# Patient Record
Sex: Female | Born: 1950 | Race: Black or African American | Hispanic: No | Smoking: Never smoker
Health system: Southern US, Community
[De-identification: ages and names within clinical notes are randomized; demographics above are authoritative.]

## PROBLEM LIST (undated history)

## (undated) DIAGNOSIS — I10 Essential (primary) hypertension: Secondary | ICD-10-CM

## (undated) DIAGNOSIS — E119 Type 2 diabetes mellitus without complications: Secondary | ICD-10-CM

## (undated) DIAGNOSIS — I639 Cerebral infarction, unspecified: Secondary | ICD-10-CM

## (undated) HISTORY — PX: ABDOMINAL HYSTERECTOMY: SHX81

---

## 2018-08-13 ENCOUNTER — Other Ambulatory Visit: Payer: Self-pay | Admitting: Family Medicine

## 2018-08-13 DIAGNOSIS — Z1231 Encounter for screening mammogram for malignant neoplasm of breast: Secondary | ICD-10-CM

## 2018-08-13 DIAGNOSIS — Z13820 Encounter for screening for osteoporosis: Secondary | ICD-10-CM

## 2018-09-12 ENCOUNTER — Ambulatory Visit
Admission: RE | Admit: 2018-09-12 | Discharge: 2018-09-12 | Disposition: A | Discharge status: Home / Self Care | Payer: Medicare HMO | Source: Ambulatory Visit | Attending: Family Medicine | Admitting: Family Medicine

## 2018-09-12 ENCOUNTER — Encounter: Payer: Self-pay | Admitting: Radiology

## 2018-09-12 DIAGNOSIS — I69398 Other sequelae of cerebral infarction: Secondary | ICD-10-CM | POA: Diagnosis not present

## 2018-09-12 DIAGNOSIS — M810 Age-related osteoporosis without current pathological fracture: Secondary | ICD-10-CM | POA: Insufficient documentation

## 2018-09-12 DIAGNOSIS — Z780 Asymptomatic menopausal state: Secondary | ICD-10-CM | POA: Diagnosis not present

## 2018-09-12 DIAGNOSIS — Z13820 Encounter for screening for osteoporosis: Secondary | ICD-10-CM | POA: Insufficient documentation

## 2018-09-12 DIAGNOSIS — Z1231 Encounter for screening mammogram for malignant neoplasm of breast: Secondary | ICD-10-CM | POA: Diagnosis present

## 2019-10-05 ENCOUNTER — Observation Stay
Admission: EM | Admit: 2019-10-05 | Discharge: 2019-10-07 | Disposition: A | Discharge status: Home / Self Care | Payer: Medicare (Managed Care) | Attending: Internal Medicine | Admitting: Internal Medicine

## 2019-10-05 ENCOUNTER — Other Ambulatory Visit: Payer: Self-pay

## 2019-10-05 DIAGNOSIS — N179 Acute kidney failure, unspecified: Secondary | ICD-10-CM | POA: Diagnosis present

## 2019-10-05 DIAGNOSIS — E785 Hyperlipidemia, unspecified: Secondary | ICD-10-CM | POA: Insufficient documentation

## 2019-10-05 DIAGNOSIS — E119 Type 2 diabetes mellitus without complications: Secondary | ICD-10-CM

## 2019-10-05 DIAGNOSIS — Z20822 Contact with and (suspected) exposure to covid-19: Secondary | ICD-10-CM | POA: Diagnosis not present

## 2019-10-05 DIAGNOSIS — K922 Gastrointestinal hemorrhage, unspecified: Principal | ICD-10-CM | POA: Diagnosis present

## 2019-10-05 DIAGNOSIS — N390 Urinary tract infection, site not specified: Secondary | ICD-10-CM | POA: Diagnosis present

## 2019-10-05 DIAGNOSIS — I10 Essential (primary) hypertension: Secondary | ICD-10-CM | POA: Diagnosis present

## 2019-10-05 DIAGNOSIS — I69354 Hemiplegia and hemiparesis following cerebral infarction affecting left non-dominant side: Secondary | ICD-10-CM | POA: Insufficient documentation

## 2019-10-05 DIAGNOSIS — Z794 Long term (current) use of insulin: Secondary | ICD-10-CM | POA: Diagnosis not present

## 2019-10-05 DIAGNOSIS — I639 Cerebral infarction, unspecified: Secondary | ICD-10-CM | POA: Diagnosis present

## 2019-10-05 DIAGNOSIS — R531 Weakness: Secondary | ICD-10-CM | POA: Insufficient documentation

## 2019-10-05 DIAGNOSIS — Z7902 Long term (current) use of antithrombotics/antiplatelets: Secondary | ICD-10-CM | POA: Diagnosis not present

## 2019-10-05 DIAGNOSIS — N3000 Acute cystitis without hematuria: Secondary | ICD-10-CM

## 2019-10-05 HISTORY — DX: Type 2 diabetes mellitus without complications: E11.9

## 2019-10-05 HISTORY — DX: Essential (primary) hypertension: I10

## 2019-10-05 HISTORY — DX: Cerebral infarction, unspecified: I63.9

## 2019-10-05 LAB — CBC
HCT: 39.3 % (ref 36.0–46.0)
HCT: 39.2 % (ref 36.0–46.0)
HCT: 39.4 % (ref 36.0–46.0)
HCT: 36.7 % (ref 36.0–46.0)
Hemoglobin: 12.4 g/dL (ref 12.0–15.0)
Hemoglobin: 13 g/dL (ref 12.0–15.0)
Hemoglobin: 13.2 g/dL (ref 12.0–15.0)
Hemoglobin: 12.7 g/dL (ref 12.0–15.0)
MCH: 29.1 pg (ref 26.0–34.0)
MCH: 29.3 pg (ref 26.0–34.0)
MCH: 29.4 pg (ref 26.0–34.0)
MCH: 29.4 pg (ref 26.0–34.0)
MCHC: 31.6 g/dL (ref 30.0–36.0)
MCHC: 33.2 g/dL (ref 30.0–36.0)
MCHC: 33.5 g/dL (ref 30.0–36.0)
MCHC: 34.6 g/dL (ref 30.0–36.0)
MCV: 92.3 fL (ref 80.0–100.0)
MCV: 88.5 fL (ref 80.0–100.0)
MCV: 87.8 fL (ref 80.0–100.0)
MCV: 85 fL (ref 80.0–100.0)
Platelets: 288 10*3/uL (ref 150–400)
Platelets: 280 10*3/uL (ref 150–400)
Platelets: 291 10*3/uL (ref 150–400)
Platelets: 282 10*3/uL (ref 150–400)
RBC: 4.26 MIL/uL (ref 3.87–5.11)
RBC: 4.43 MIL/uL (ref 3.87–5.11)
RBC: 4.49 MIL/uL (ref 3.87–5.11)
RBC: 4.32 MIL/uL (ref 3.87–5.11)
RDW: 13.2 % (ref 11.5–15.5)
RDW: 12.9 % (ref 11.5–15.5)
RDW: 13 % (ref 11.5–15.5)
RDW: 12.9 % (ref 11.5–15.5)
WBC: 10 10*3/uL (ref 4.0–10.5)
WBC: 9.1 10*3/uL (ref 4.0–10.5)
WBC: 9.8 10*3/uL (ref 4.0–10.5)
WBC: 13 10*3/uL — ABNORMAL HIGH (ref 4.0–10.5)
nRBC: 0 % (ref 0.0–0.2)
nRBC: 0 % (ref 0.0–0.2)
nRBC: 0 % (ref 0.0–0.2)
nRBC: 0 % (ref 0.0–0.2)

## 2019-10-05 LAB — TYPE AND SCREEN
ABO/RH(D): O POS
Antibody Screen: NEGATIVE

## 2019-10-05 LAB — COMPREHENSIVE METABOLIC PANEL
ALT: 18 U/L (ref 0–44)
AST: 20 U/L (ref 15–41)
Albumin: 3.8 g/dL (ref 3.5–5.0)
Alkaline Phosphatase: 177 U/L — ABNORMAL HIGH (ref 38–126)
Anion gap: 14 (ref 5–15)
BUN: 33 mg/dL — ABNORMAL HIGH (ref 8–23)
CO2: 19 mmol/L — ABNORMAL LOW (ref 22–32)
Calcium: 9.1 mg/dL (ref 8.9–10.3)
Chloride: 107 mmol/L (ref 98–111)
Creatinine, Ser: 1.89 mg/dL — ABNORMAL HIGH (ref 0.44–1.00)
GFR calc Af Amer: 31 mL/min — ABNORMAL LOW (ref >60)
GFR calc non Af Amer: 27 mL/min — ABNORMAL LOW (ref >60)
Glucose, Bld: 172 mg/dL — ABNORMAL HIGH (ref 70–99)
Potassium: 3.7 mmol/L (ref 3.5–5.1)
Sodium: 140 mmol/L (ref 135–145)
Total Bilirubin: 0.5 mg/dL (ref 0.3–1.2)
Total Protein: 8.3 g/dL — ABNORMAL HIGH (ref 6.5–8.1)

## 2019-10-05 LAB — URINALYSIS, COMPLETE (UACMP) WITH MICROSCOPIC
Bilirubin Urine: NEGATIVE
Glucose, UA: NEGATIVE mg/dL
Hgb urine dipstick: NEGATIVE
Ketones, ur: NEGATIVE mg/dL
Nitrite: NEGATIVE
Protein, ur: 100 mg/dL — AB
Specific Gravity, Urine: 1.01 (ref 1.005–1.030)
pH: 5 (ref 5.0–8.0)

## 2019-10-05 LAB — GLUCOSE, CAPILLARY: Glucose-Capillary: 99 mg/dL (ref 70–99)

## 2019-10-05 LAB — HIV ANTIBODY (ROUTINE TESTING W REFLEX): HIV Screen 4th Generation wRfx: NONREACTIVE

## 2019-10-05 LAB — SARS CORONAVIRUS 2 (TAT 6-24 HRS): SARS Coronavirus 2: NEGATIVE

## 2019-10-05 LAB — APTT: aPTT: 32 seconds (ref 24–36)

## 2019-10-05 LAB — PROTIME-INR
INR: 0.9 (ref 0.8–1.2)
Prothrombin Time: 11.8 seconds (ref 11.4–15.2)

## 2019-10-05 MED ORDER — ACETAMINOPHEN 325 MG PO TABS
650.0000 mg | ORAL_TABLET | Freq: Four times a day (QID) | ORAL | Status: DC | PRN
  Administered 2019-10-07: 650 mg via ORAL
  Filled 2019-10-05: qty 2

## 2019-10-05 MED ORDER — ONDANSETRON HCL 4 MG/2ML IJ SOLN: 4.0000 mg | Freq: Three times a day (TID) | INTRAMUSCULAR | Status: DC | PRN

## 2019-10-05 MED ORDER — SODIUM CHLORIDE 0.9 % IV SOLN: INTRAVENOUS | Status: DC

## 2019-10-05 MED ORDER — CEPHALEXIN 500 MG PO CAPS
500.0000 mg | ORAL_CAPSULE | Freq: Once | ORAL | Status: AC
  Administered 2019-10-05: 08:00:00 500 mg via ORAL
  Filled 2019-10-05: qty 1

## 2019-10-05 MED ORDER — PANTOPRAZOLE SODIUM 40 MG IV SOLR
40.0000 mg | Freq: Two times a day (BID) | INTRAVENOUS | Status: DC
  Administered 2019-10-05 – 2019-10-07 (×5): 40 mg via INTRAVENOUS
  Filled 2019-10-05 (×5): qty 40

## 2019-10-05 MED ORDER — HYDRALAZINE HCL 25 MG PO TABS: 25.0000 mg | ORAL_TABLET | Freq: Three times a day (TID) | ORAL | Status: DC | PRN

## 2019-10-05 MED ORDER — SODIUM CHLORIDE 0.9 % IV BOLUS
1000.0000 mL | Freq: Once | INTRAVENOUS | Status: AC
  Administered 2019-10-05: 1000 mL via INTRAVENOUS

## 2019-10-05 MED ORDER — SODIUM CHLORIDE 0.9 % IV SOLN
1.0000 g | INTRAVENOUS | Status: DC
  Administered 2019-10-05 – 2019-10-06 (×2): 1 g via INTRAVENOUS
  Filled 2019-10-05: qty 1
  Filled 2019-10-05 (×2): qty 10
  Filled 2019-10-05: qty 1

## 2019-10-05 MED ORDER — PEG 3350-KCL-NA BICARB-NACL 420 G PO SOLR
4000.0000 mL | Freq: Once | ORAL | Status: AC
  Administered 2019-10-05: 4000 mL via ORAL
  Filled 2019-10-05: qty 4000

## 2019-10-05 MED ORDER — INSULIN ASPART 100 UNIT/ML ~~LOC~~ SOLN
0.0000 [IU] | SUBCUTANEOUS | Status: DC
  Administered 2019-10-06: 3 [IU] via SUBCUTANEOUS
  Administered 2019-10-07: 09:00:00 1 [IU] via SUBCUTANEOUS
  Administered 2019-10-07: 2 [IU] via SUBCUTANEOUS
  Filled 2019-10-05 (×3): qty 1

## 2019-10-05 NOTE — ED Notes (Signed)
Lab at bedside

## 2019-10-05 NOTE — Progress Notes (Signed)
Received call from Dr. Suzanna Obey who alerted me that patient is actually on chronic antiplatelet therapy with Plavix 75 mg daily due to her stroke history. I have verified with patient's daughter-in-law that she has not taken her Plavix today. We will proceed with colonoscopy as scheduled.

## 2019-10-05 NOTE — ED Notes (Signed)
This RN assess pt arms to attempt blood cultures. Was unable to find any veins to stick to attempt blood culture collection. Called lab to ask them to try to collect blood cultures. They stated they would send someone in a few minutes.

## 2019-10-05 NOTE — ED Notes (Signed)
EDP at bedside  

## 2019-10-05 NOTE — ED Notes (Addendum)
Attempted to obtain blood cultures through [place] IV. Able to get small amount of blood in cultures. Sent to lab. Unable to collect second set at this time. Attempted [place] IV x 2 without success. Will obtain order for IV team.

## 2019-10-05 NOTE — ED Notes (Signed)
Attempt iv insertion x1 without success. Danelle Earthly, rn in with ultrasound to attempt iv insertion.

## 2019-10-05 NOTE — H&P (View-Only) (Signed)
Received call from Dr. Sheri Mouw who alerted me that patient is actually on chronic antiplatelet therapy with Plavix 75 mg daily due to her stroke history. I have verified with patient's daughter-in-law that she has not taken her Plavix today. We will proceed with colonoscopy as scheduled.  

## 2019-10-05 NOTE — ED Triage Notes (Signed)
Pt arrives from home via [facility] with complaint of onset of "lower GI bleed." Pt reports bright bloody stool new onset this morning. Pt reports no pain, cramping, or distress

## 2019-10-05 NOTE — Consult Note (Signed)
GI Inpatient Consult Note  Reason for Consult: Rectal bleeding/LGI bleeding   Attending Requesting Consult: Dr. Ivor Costa, MD  History of Present Illness: Dana Lucas is a 69 y.o. female seen for evaluation of lower GI bleeding at the request of Dr. Blaine Hamper. Pt has a PMH of diabetes mellitus, HTN, and Hx of CVA who presented to the ED this morning after passing one large painless episode of bright red blood around 4:30 this morning. She reports there was a large amount of bright red blood without any fecal material. She denies any associated abdominal pain or cramping, fever, chills, or rectal pain. She denies any previous history of GI bleeding. She denies chest pain, SOB, lightheadedness, or dizziness. She reports she currently feels at baseline. Upon presentation to the ED, she was found to have stable hemoglobin at 12.4 - no baseline to compare to, AKI with Cr 1.89, BUN 33, INR 0.9. She denies any frequent NSAID use. She reports a previous colonoscopy appx 10 years ago where polyps were removed - procedure report not available for review. She is not on any current anticoagulation or antiplatelet therapy. She does report chronic left-sided weakness for previous stroke. She reports no recent changes in her bowel habits prior to this episode. She reports she lives with her son and daughter-in-law Dana Lucas) at home. No recent travel or sick contacts. No known family history of colon cancer, adenomatous polyps, or IBD. Hx of CVA appx 5 years ago.   Last Colonoscopy: 10 years ago - procedure report not available Last Endoscopy:    Past Medical History:  Past Medical History:  Diagnosis Date  . Diabetes mellitus without complication ([place])   . Hypertension   . Stroke [facility])     Problem List: Patient Active Problem List   Diagnosis Date Noted  . Hypertension   . Diabetes mellitus without complication ([place])   . Stroke ([place])   . GIB (gastrointestinal bleeding)   . AKI (acute kidney injury)  ([place])   . UTI (urinary tract infection)     Past Surgical History: Past Surgical History:  Procedure Laterality Date  . ABDOMINAL HYSTERECTOMY      Allergies: Allergies  Allergen Reactions  . Lisinopril Cough    Home Medications: (Not in a hospital admission)  Home medication reconciliation was completed with the patient.   Scheduled Inpatient Medications:   . pantoprazole (PROTONIX) IV  40 mg Intravenous Q12H    Continuous Inpatient Infusions:   . sodium chloride    . cefTRIAXone (ROCEPHIN)  IV      PRN Inpatient Medications:  acetaminophen, hydrALAZINE, ondansetron (ZOFRAN) IV  Family History: family history includes Breast cancer (age of onset: 55) in her mother.  The patient's family history is negative for inflammatory bowel disorders, GI malignancy, or solid organ transplantation.  Social History:   reports that she has never smoked. She has never used smokeless tobacco. She reports previous alcohol use. She reports previous drug use. The patient denies ETOH, tobacco, or drug use.   Review of Systems: Constitutional: Weight is stable.  Eyes: No changes in vision. ENT: No oral lesions, sore throat.  GI: see HPI.  Heme/Lymph: No easy bruising.  CV: No chest pain.  GU: No hematuria.  Integumentary: No rashes.  Neuro: No headaches.  Psych: No depression/anxiety.  Endocrine: No heat/cold intolerance.  Allergic/Immunologic: No urticaria.  Resp: No cough, SOB.  Musculoskeletal: No joint swelling.    Physical Examination: BP (!) 148/72   Pulse 60   Temp  98.2 F (36.8 C) (Oral)   Resp 18   Ht 5\' 2"  (1.575 m)   Wt 86.2 kg   SpO2 98%   BMI 34.75 kg/m  Gen: NAD, alert and oriented x 4 HEENT: PEERLA, EOMI, Neck: supple, no JVD or thyromegaly Chest: CTA bilaterally, no wheezes, crackles, or other adventitious sounds CV: RRR, no m/g/c/r Abd: soft, NT, ND, +BS in all four quadrants; no HSM, guarding, ridigity, or rebound tenderness Ext: no edema, well  perfused with 2+ pulses, Skin: no rash or lesions noted Lymph: no LAD  Data: Lab Results  Component Value Date   WBC 10.0 10/05/2019   HGB 12.4 10/05/2019   HCT 39.3 10/05/2019   MCV 92.3 10/05/2019   PLT 288 10/05/2019   Recent Labs  Lab 10/05/19 0656  HGB 12.4   Lab Results  Component Value Date   NA 140 10/05/2019   K 3.7 10/05/2019   CL 107 10/05/2019   CO2 19 (L) 10/05/2019   BUN 33 (H) 10/05/2019   CREATININE 1.89 (H) 10/05/2019   Lab Results  Component Value Date   ALT 18 10/05/2019   AST 20 10/05/2019   ALKPHOS 177 (H) 10/05/2019   BILITOT 0.5 10/05/2019   Recent Labs  Lab 10/05/19 0656  INR 0.9   Assessment/Plan:  69 y/o female with a PMH of diabetes mellitus, HTN, Hx of CVA presented to the ED this morning lower GI bleeding  1. Lower GI bleeding - differential includes anal outlet etiology from internal hemorrhoids, IBD, AVMs, self-limited diverticular bleed, colon polyp, ischemic colitis, or malignancy  2. Dysuria - urine culture pending, hospitalist following  3. Hx of CVA - appx 5 years ago, no current antiplatelet/anticoagulation   -She is hemodynamically stable with hgb 12.4. No recurrent episodes of hematochezia -Continue to monitor serial H&H. Transfuse if Hgb <7.0. -Agree with acid suppression -Discussed definitive evaluation with colonoscopy with patient. We discussed procedure details and indications thoroughly today. I have also called and spoken with patient's HPOA [address]) and obtained consent for procedures. -Clear liquid diet today. Bowel prep around 1700 today. -NPO after midnight -Plan for diagnostic colonoscopy tomorrow morning with Dr. [name] -See procedure note for findings and further recommendations  I reviewed the risks (including bleeding, perforation, infection, anesthesia complications, cardiac/respiratory complications), benefits and alternatives of colonoscopy. Patient consents to proceed.     Thank you  for the consult. Please call with questions or concerns.  Norma Fredrickson [facility] [phone] (Cell)

## 2019-10-05 NOTE — ED Provider Notes (Signed)
[facility] Emergency Department Provider Note       Time seen: ----------------------------------------- 6:57 AM on 10/05/2019 -----------------------------------------  I have reviewed the triage vital signs and the nursing notes.  HISTORY   Chief Complaint GI Problem   HPI Dana Lucas is a 69 y.o. female with a history of diabetes, hypertension, CVA who presents to the ED for lower GI bleeding.  Patient states she had a large amount of bright red blood she passed rectally and filled the toilet.  She felt like she was having diarrhea but it was just blood.  She is not having any pain, cramping or distress.  She has never had this happen before.  Past Medical History:  Diagnosis Date  . Diabetes mellitus without complication (HCC)   . Hypertension   . Stroke [facility])     There are no problems to display for this patient.   Past Surgical History:  Procedure Laterality Date  . ABDOMINAL HYSTERECTOMY      Allergies Lisinopril  Social History Social History   Tobacco Use  . Smoking status: Never Smoker  . Smokeless tobacco: Never Used  Substance Use Topics  . Alcohol use: Not Currently  . Drug use: Not Currently   Review of Systems Constitutional: Negative for fever. Cardiovascular: Negative for chest pain. Respiratory: Negative for shortness of breath. Gastrointestinal: Negative for abdominal pain, vomiting and diarrhea.  Positive for rectal bleeding Musculoskeletal: Negative for back pain. Skin: Negative for rash. Neurological: Negative for headaches, focal weakness or numbness.  All systems negative/normal/unremarkable except as stated in the HPI  ____________________________________________   PHYSICAL EXAM:  VITAL SIGNS: ED Triage Vitals  Enc Vitals Group     BP 10/05/19 0630 (!) 178/79     Pulse Rate 10/05/19 0630 70     Resp 10/05/19 0630 18     Temp 10/05/19 0632 98.2 F (36.8 C)     Temp Source 10/05/19 0632 Oral      SpO2 10/05/19 0630 98 %     Weight 10/05/19 0632 190 lb (86.2 kg)     Height 10/05/19 0632 5\' 2"  (1.575 m)     Head Circumference --      Peak Flow --      Pain Score 10/05/19 0632 0     Pain Loc --      Pain Edu? --      Excl. in GC? --    Constitutional: Alert and oriented. Well appearing and in no distress. Eyes: Conjunctivae are normal. Normal extraocular movements. Cardiovascular: Normal rate, regular rhythm. No murmurs, rubs, or gallops. Respiratory: Normal respiratory effort without tachypnea nor retractions. Breath sounds are clear and equal bilaterally. No wheezes/rales/rhonchi. Gastrointestinal: Soft and nontender. Normal bowel sounds Musculoskeletal: Nontender with normal range of motion in extremities. No lower extremity tenderness nor edema. Neurologic:  Normal speech and language. No gross focal neurologic deficits are appreciated.  Skin:  Skin is warm, dry and intact. No rash noted. Psychiatric: Mood and affect are normal. Speech and behavior are normal.  ____________________________________________  ED COURSE:  As part of my medical decision making, I reviewed the following data within the electronic MEDICAL RECORD NUMBER History obtained from family if available, nursing notes, old chart and ekg, as well as notes from prior ED visits. Patient presented for rectal bleeding, we will assess with labs and imaging as indicated at this time.   Procedures  Rayhana Slider was evaluated in Emergency Department on 10/05/2019 for the symptoms described in the history  of present illness. She was evaluated in the context of the global COVID-19 pandemic, which necessitated consideration that the patient might be at risk for infection with the SARS-CoV-2 virus that causes COVID-19. Institutional protocols and algorithms that pertain to the evaluation of patients at risk for COVID-19 are in a state of rapid change based on information released by regulatory bodies including the CDC and federal  and state organizations. These policies and algorithms were followed during the patient's care in the ED.  ____________________________________________   LABS (pertinent positives/negatives)  Labs Reviewed  COMPREHENSIVE METABOLIC PANEL - Abnormal; Notable for the following components:      Result Value   CO2 19 (*)    Glucose, Bld 172 (*)    BUN 33 (*)    Creatinine, Ser 1.89 (*)    Total Protein 8.3 (*)    Alkaline Phosphatase 177 (*)    GFR calc non Af Amer 27 (*)    GFR calc Af Amer 31 (*)    All other components within normal limits  URINALYSIS, COMPLETE (UACMP) WITH MICROSCOPIC - Abnormal; Notable for the following components:   Color, Urine YELLOW (*)    APPearance HAZY (*)    Protein, ur 100 (*)    Leukocytes,Ua TRACE (*)    Bacteria, UA MANY (*)    All other components within normal limits  SARS CORONAVIRUS 2 (TAT 6-24 HRS)  CBC  PROTIME-INR  POC OCCULT BLOOD, ED  TYPE AND SCREEN  ____________________________________________   DIFFERENTIAL DIAGNOSIS   Diverticulosis, anemia, coagulopathy, hemorrhoids  FINAL ASSESSMENT AND PLAN  Lower GI bleeding, UTI   Plan: The patient had presented for lower GI bleeding. Patient's labs revealed some kidney disease of uncertain chronicity,  evidence of urinary tract infection, otherwise no acute process.  She did not have any further bleeding here, but describes massive lower GI bleeding before she arrived.  I will discuss with the hospitalist for admission and GI consultation.   Laurence Aly, MD    Note: This note was generated in part or whole with voice recognition software. Voice recognition is usually quite accurate but there are transcription errors that can and very often do occur. I apologize for any typographical errors that were not detected and corrected.     Earleen Newport, MD 10/05/19 [phone]

## 2019-10-05 NOTE — H&P (Signed)
History and Physical    Dana Lucas DUK:[id number] DOB: 1951-01-30 DOA: 10/05/2019  Referring MD/NP/PA:   PCP: Dana Bailey, MD   Patient coming from:  The patient is coming from home.  At baseline, pt is independent for most of ADL.        Chief Complaint: Rectal bleeding  HPI: Dana Lucas is a 69 y.o. female with medical history significant of hypertension, diabetes mellitus, stroke with left sided weakness, who presents with rectal bleeding.  Pt sates that he had a large amount of rectal bleeding with bright red blood. She felt like she was having diarrhea but it was just blood.  No nausea, vomiting, abdominal pain.  No chest pain, shortness of breath, denies lightheadedness.  Patient reports of burning on urination.  She has chronic left-sided weakness from a previous stroke which has not changed.  ED Course: pt was found to have hemoglobin 12.4 (no baseline hemoglobin available), AKI with creatinine 1.89, BUN 33 (no baseline creatinine available), INR 0.9, positive urinalysis (hazy appearance, trace amount of leukocyte, many bacteria, WBC 21-50), pending COVID-19 PCR, temperature normal, blood pressure 148/73, heart rate 61, oxygen saturation 98% on room air.  Patient is placed on MedSurg bed for observation.  GI, Dr. Norma Fredrickson was consulted.  Review of Systems:   General: no fevers, chills, no body weight gain, has fatigue HEENT: no blurry vision, hearing changes or sore throat Respiratory: no dyspnea, coughing, wheezing CV: no chest pain, no palpitations GI: no nausea, vomiting, abdominal pain, diarrhea, constipation. Has rectal bleeding GU: no dysuria, burning on urination, increased urinary frequency, hematuria  Ext: has trace leg edema Neuro: no unilateral weakness, numbness, or tingling, no vision change or hearing loss Skin: no rash, no skin tear. MSK: No muscle spasm, no deformity, no limitation of range of movement in spin Heme: No easy bruising.  Travel history: No  recent long distant travel.  Allergy:  Allergies  Allergen Reactions  . Lisinopril Cough    Past Medical History:  Diagnosis Date  . Diabetes mellitus without complication (HCC)   . Hypertension   . Stroke [facility])     Past Surgical History:  Procedure Laterality Date  . ABDOMINAL HYSTERECTOMY      Social History:  reports that she has never smoked. She has never used smokeless tobacco. She reports previous alcohol use. She reports previous drug use.  Family History:  Family History  Problem Relation Age of Onset  . Breast cancer Mother 53     Prior to Admission medications   Not on File    Physical Exam: Vitals:   10/05/19 0632 10/05/19 0808 10/05/19 0830 10/05/19 0900  BP:  (!) 144/67 (!) 148/73 (!) 148/72  Pulse:  64 61 60  Resp:      Temp: 98.2 F (36.8 C)     TempSrc: Oral     SpO2:  100% 98% 98%  Weight: 86.2 kg     Height: 5\' 2"  (1.575 m)      General: Not in acute distress HEENT:       Eyes: PERRL, EOMI, no scleral icterus.       ENT: No discharge from the ears and nose, no pharynx injection, no tonsillar enlargement.        Neck: No JVD, no bruit, no mass felt. Heme: No neck lymph node enlargement. Cardiac: S1/S2, RRR, No murmurs, No gallops or rubs. Respiratory:  No rales, wheezing, rhonchi or rubs. GI: Soft, nondistended, nontender, no rebound pain, no organomegaly, BS  present. GU: No hematuria Ext: has trace  leg edema bilaterally. 2+DP/PT pulse bilaterally. Musculoskeletal: No joint deformities, No joint redness or warmth, no limitation of ROM in spin. Skin: No rashes.  Neuro: Alert, oriented X3, cranial nerves II-XII grossly intact, has left sided weakness. Psych: Patient is not psychotic, no suicidal or hemocidal ideation.  Labs on Admission: I have personally reviewed following labs and imaging studies  CBC: Recent Labs  Lab 10/05/19 0656  WBC 10.0  HGB 12.4  HCT 39.3  MCV 92.3  PLT 034   Basic Metabolic Panel: Recent Labs  Lab  10/05/19 0656  NA 140  K 3.7  CL 107  CO2 19*  GLUCOSE 172*  BUN 33*  CREATININE 1.89*  CALCIUM 9.1   GFR: Estimated Creatinine Clearance: 28.6 mL/min (A) (by C-G formula based on SCr of 1.89 mg/dL (H)). Liver Function Tests: Recent Labs  Lab 10/05/19 0656  AST 20  ALT 18  ALKPHOS 177*  BILITOT 0.5  PROT 8.3*  ALBUMIN 3.8   No results for input(s): LIPASE, AMYLASE in the last 168 hours. No results for input(s): AMMONIA in the last 168 hours. Coagulation Profile: Recent Labs  Lab 10/05/19 0656  INR 0.9   Cardiac Enzymes: No results for input(s): CKTOTAL, CKMB, CKMBINDEX, TROPONINI in the last 168 hours. BNP (last 3 results) No results for input(s): PROBNP in the last 8760 hours. HbA1C: No results for input(s): HGBA1C in the last 72 hours. CBG: No results for input(s): GLUCAP in the last 168 hours. Lipid Profile: No results for input(s): CHOL, HDL, LDLCALC, TRIG, CHOLHDL, LDLDIRECT in the last 72 hours. Thyroid Function Tests: No results for input(s): TSH, T4TOTAL, FREET4, T3FREE, THYROIDAB in the last 72 hours. Anemia Panel: No results for input(s): VITAMINB12, FOLATE, FERRITIN, TIBC, IRON, RETICCTPCT in the last 72 hours. Urine analysis:    Component Value Date/Time   COLORURINE YELLOW (A) 10/05/2019 0715   APPEARANCEUR HAZY (A) 10/05/2019 0715   LABSPEC 1.010 10/05/2019 0715   PHURINE 5.0 10/05/2019 0715   GLUCOSEU NEGATIVE 10/05/2019 0715   HGBUR NEGATIVE 10/05/2019 0715   BILIRUBINUR NEGATIVE 10/05/2019 0715   KETONESUR NEGATIVE 10/05/2019 0715   PROTEINUR 100 (A) 10/05/2019 0715   NITRITE NEGATIVE 10/05/2019 0715   LEUKOCYTESUR TRACE (A) 10/05/2019 0715   Sepsis Labs: @LABRCNTIP (procalcitonin:4,lacticidven:4) )No results found for this or any previous visit (from the past 240 hour(s)).   Radiological Exams on Admission: No results found.   EKG: Not done in ED, will get one.   Assessment/Plan Principal Problem:   GIB (gastrointestinal  bleeding) Active Problems:   Hypertension   Diabetes mellitus without complication (HCC)   Stroke (HCC)   AKI (acute kidney injury) ([place])   UTI (urinary tract infection)   GIB (gastrointestinal bleeding): pt has rectal bleeding.  Possibly due to lower GI bleeding.  Hgb 12.4, no EGD or colonoscopy on record.  Hemodynamically stable. Dr. Alice Reichert of GI was consulted.   - will place in med-surg bed obs - GI consulted by Ed, will follow up recommendations - NPO for possible EGD - IVF: 1L NS bolus, and then 75 cc/h - Start IV pantoprazole 40 mg bid - Zofran IV for nausea - Avoid NSAIDs and SQ heparin - Maintain IV access (2 large bore IVs if possible). - Monitor closely and follow q6h cbc, transfuse as necessary, if Hgb<7.0 - LaB: INR, PTT and type screen  Diabetes mellitus without complication ([place]): no V4Q on record. Pt states that she is taking novolog and lanuts  at home. -SSI -Decrease Lantus dose from 46 to 30 unit daily  Stroke [facility]): -hold plavix due to GIB.  AKI (acute kidney injury) [facility]): Creatinine 1.89, BUN 33.  No baseline creatinine on record.  Possibly due to dehydration and UTI. -IV fluid as above -Follow-up by renal  UTI (urinary tract infection): -IV rocephin -f/u Bx and Ux  Essential hypertension: Bp 148/73 -prn Hydralazine -Continue home medications   DVT ppx: SCD Code Status: Full code Family Communication: None at bed side.     Disposition Plan:  Anticipate discharge back to previous home environment Consults called:  Dr. Norma Fredrickson of GI Admission status: Med-surg bed for obs    Date of Service 10/05/2019    Lorretta Harp [facility]   If 7PM-7AM, please contact night-coverage [email or web address] Password [facility] 10/05/2019, 10:41 AM

## 2019-10-05 NOTE — ED Notes (Signed)
Report given to Jennifer, RN

## 2019-10-05 NOTE — ED Notes (Signed)
IV team at bedside 

## 2019-10-05 NOTE — ED Notes (Signed)
Admitting at bedside 

## 2019-10-05 NOTE — ED Notes (Signed)
Pt ambulated with assistance to toilet with this RN. Unsteady gait noted. Urinated into hat, urine collected and sent to lab.

## 2019-10-05 NOTE — Progress Notes (Addendum)
Current PIV in RAFA infiltrated. Assessed bilateral arms with ultrasound for PIV and midline with no suitable veins found. Veins in upper arm 3cm or greater deep. Recommend EJ vs CVC since blood cultures are pending. RN made aware.

## 2019-10-06 ENCOUNTER — Observation Stay: Payer: Medicare (Managed Care) | Admitting: Anesthesiology

## 2019-10-06 ENCOUNTER — Encounter: Payer: Self-pay | Admitting: Internal Medicine

## 2019-10-06 ENCOUNTER — Encounter
Admission: EM | Disposition: A | Discharge status: Home / Self Care | Payer: Self-pay | Source: Home / Self Care | Attending: Emergency Medicine

## 2019-10-06 DIAGNOSIS — I10 Essential (primary) hypertension: Secondary | ICD-10-CM | POA: Diagnosis not present

## 2019-10-06 DIAGNOSIS — E119 Type 2 diabetes mellitus without complications: Secondary | ICD-10-CM | POA: Diagnosis not present

## 2019-10-06 DIAGNOSIS — K922 Gastrointestinal hemorrhage, unspecified: Secondary | ICD-10-CM | POA: Diagnosis not present

## 2019-10-06 HISTORY — PX: COLONOSCOPY: SHX5424

## 2019-10-06 LAB — CBC
HCT: 40.5 % (ref 36.0–46.0)
HCT: 38 % (ref 36.0–46.0)
HCT: 36.5 % (ref 36.0–46.0)
Hemoglobin: 12.9 g/dL (ref 12.0–15.0)
Hemoglobin: 13 g/dL (ref 12.0–15.0)
Hemoglobin: 12.3 g/dL (ref 12.0–15.0)
MCH: 28.9 pg (ref 26.0–34.0)
MCH: 29.5 pg (ref 26.0–34.0)
MCH: 29.6 pg (ref 26.0–34.0)
MCHC: 31.9 g/dL (ref 30.0–36.0)
MCHC: 34.2 g/dL (ref 30.0–36.0)
MCHC: 33.7 g/dL (ref 30.0–36.0)
MCV: 90.8 fL (ref 80.0–100.0)
MCV: 86.2 fL (ref 80.0–100.0)
MCV: 87.7 fL (ref 80.0–100.0)
Platelets: 285 10*3/uL (ref 150–400)
Platelets: 284 10*3/uL (ref 150–400)
Platelets: 252 10*3/uL (ref 150–400)
RBC: 4.46 MIL/uL (ref 3.87–5.11)
RBC: 4.41 MIL/uL (ref 3.87–5.11)
RBC: 4.16 MIL/uL (ref 3.87–5.11)
RDW: 12.8 % (ref 11.5–15.5)
RDW: 12.8 % (ref 11.5–15.5)
RDW: 12.9 % (ref 11.5–15.5)
WBC: 10.2 10*3/uL (ref 4.0–10.5)
WBC: 8.9 10*3/uL (ref 4.0–10.5)
WBC: 9 10*3/uL (ref 4.0–10.5)
nRBC: 0 % (ref 0.0–0.2)
nRBC: 0 % (ref 0.0–0.2)
nRBC: 0 % (ref 0.0–0.2)

## 2019-10-06 LAB — BASIC METABOLIC PANEL
Anion gap: 11 (ref 5–15)
BUN: 21 mg/dL (ref 8–23)
CO2: 22 mmol/L (ref 22–32)
Calcium: 8.9 mg/dL (ref 8.9–10.3)
Chloride: 108 mmol/L (ref 98–111)
Creatinine, Ser: 1.62 mg/dL — ABNORMAL HIGH (ref 0.44–1.00)
GFR calc Af Amer: 37 mL/min — ABNORMAL LOW (ref >60)
GFR calc non Af Amer: 32 mL/min — ABNORMAL LOW (ref >60)
Glucose, Bld: 94 mg/dL (ref 70–99)
Potassium: 3.7 mmol/L (ref 3.5–5.1)
Sodium: 141 mmol/L (ref 135–145)

## 2019-10-06 LAB — GLUCOSE, CAPILLARY
Glucose-Capillary: 173 mg/dL — ABNORMAL HIGH (ref 70–99)
Glucose-Capillary: 203 mg/dL — ABNORMAL HIGH (ref 70–99)
Glucose-Capillary: 76 mg/dL (ref 70–99)
Glucose-Capillary: 95 mg/dL (ref 70–99)
Glucose-Capillary: 97 mg/dL (ref 70–99)

## 2019-10-06 LAB — HEMOGLOBIN A1C
Hgb A1c MFr Bld: 7.5 % — ABNORMAL HIGH (ref 4.8–5.6)
Mean Plasma Glucose: 168.55 mg/dL

## 2019-10-06 SURGERY — COLONOSCOPY
Anesthesia: General

## 2019-10-06 MED ORDER — LOSARTAN POTASSIUM 25 MG PO TABS
25.0000 mg | ORAL_TABLET | Freq: Every day | ORAL | Status: DC
  Administered 2019-10-07: 25 mg via ORAL
  Filled 2019-10-06: qty 1

## 2019-10-06 MED ORDER — AMLODIPINE BESYLATE 10 MG PO TABS
10.0000 mg | ORAL_TABLET | Freq: Every day | ORAL | Status: DC
  Administered 2019-10-07: 10 mg via ORAL
  Filled 2019-10-06: qty 1

## 2019-10-06 MED ORDER — CLONIDINE HCL 0.1 MG/24HR TD PTWK: 0.1000 mg | MEDICATED_PATCH | TRANSDERMAL | Status: DC

## 2019-10-06 MED ORDER — PROPOFOL 500 MG/50ML IV EMUL
INTRAVENOUS | Status: AC
  Filled 2019-10-06: qty 50

## 2019-10-06 MED ORDER — PROPOFOL 500 MG/50ML IV EMUL
INTRAVENOUS | Status: DC | PRN
  Administered 2019-10-06: 120 ug/kg/min via INTRAVENOUS

## 2019-10-06 MED ORDER — ATORVASTATIN CALCIUM 20 MG PO TABS
80.0000 mg | ORAL_TABLET | Freq: Every day | ORAL | Status: DC
  Administered 2019-10-06: 80 mg via ORAL
  Filled 2019-10-06: qty 4

## 2019-10-06 MED ORDER — PROPOFOL 10 MG/ML IV BOLUS
INTRAVENOUS | Status: DC | PRN
  Administered 2019-10-06: 80 mg via INTRAVENOUS

## 2019-10-06 MED ORDER — CLOPIDOGREL BISULFATE 75 MG PO TABS
75.0000 mg | ORAL_TABLET | Freq: Every day | ORAL | Status: DC
  Administered 2019-10-06 – 2019-10-07 (×2): 75 mg via ORAL
  Filled 2019-10-06 (×2): qty 1

## 2019-10-06 MED ORDER — INSULIN GLARGINE 100 UNIT/ML ~~LOC~~ SOLN
15.0000 [IU] | Freq: Every day | SUBCUTANEOUS | Status: DC
  Administered 2019-10-06: 15 [IU] via SUBCUTANEOUS
  Filled 2019-10-06 (×2): qty 0.15

## 2019-10-06 MED ORDER — LIDOCAINE HCL 4 % EX CREA
1.0000 "application " | TOPICAL_CREAM | Freq: Three times a day (TID) | CUTANEOUS | Status: DC | PRN
  Filled 2019-10-06: qty 15

## 2019-10-06 MED ORDER — MIRABEGRON ER 50 MG PO TB24
50.0000 mg | ORAL_TABLET | Freq: Every day | ORAL | Status: DC
  Administered 2019-10-07: 09:00:00 50 mg via ORAL
  Filled 2019-10-06: qty 1

## 2019-10-06 MED ORDER — CARVEDILOL 12.5 MG PO TABS
12.5000 mg | ORAL_TABLET | Freq: Two times a day (BID) | ORAL | Status: DC
  Administered 2019-10-06 – 2019-10-07 (×3): 12.5 mg via ORAL
  Filled 2019-10-06 (×3): qty 1

## 2019-10-06 MED ORDER — INSULIN GLARGINE 100 UNIT/ML ~~LOC~~ SOLN: 30.0000 [IU] | Freq: Every day | SUBCUTANEOUS | Status: DC

## 2019-10-06 MED ORDER — PHENYLEPHRINE HCL (PRESSORS) 10 MG/ML IV SOLN
INTRAVENOUS | Status: AC
  Filled 2019-10-06: qty 1

## 2019-10-06 NOTE — Interval H&P Note (Signed)
History and Physical Interval Note:  10/06/2019 9:53 AM  Dana Lucas  has presented today for surgery, with the diagnosis of Lower GI bleeding, painless hematochezia.  The various methods of treatment have been discussed with the patient and family. After consideration of risks, benefits and other options for treatment, the patient has consented to  Procedure(s): COLONOSCOPY (N/A) as a surgical intervention.  The patient's history has been reviewed, patient examined, no change in status, stable for surgery.  I have reviewed the patient's chart and labs.  Questions were answered to the patient's satisfaction.     [place], [place]

## 2019-10-06 NOTE — Progress Notes (Signed)
Patient back on unit from colonoscopy. A&O, VSS.

## 2019-10-06 NOTE — Anesthesia Preprocedure Evaluation (Signed)
Anesthesia Evaluation  Patient identified by MRN, date of birth, ID band Patient awake    Reviewed: Allergy & Precautions, NPO status , Patient's Chart, lab work & pertinent test results  History of Anesthesia Complications Negative for: history of anesthetic complications  Airway Mallampati: III       Dental  (+) Missing, Poor Dentition, Chipped   Pulmonary           Cardiovascular hypertension, Pt. on medications (-) Past MI and (-) CHF (-) dysrhythmias (-) Valvular Problems/Murmurs     Neuro/Psych Seizures - (one time, no tx),  CVA (L sided weakness), Residual Symptoms    GI/Hepatic Neg liver ROS, neg GERD  ,  Endo/Other  diabetes, Type 2, Insulin Dependent  Renal/GU Renal InsufficiencyRenal disease     Musculoskeletal   Abdominal   Peds  Hematology   Anesthesia Other Findings   Reproductive/Obstetrics                             Anesthesia Physical Anesthesia Plan  ASA: III and emergent  Anesthesia Plan: General   Post-op Pain Management:    Induction: Intravenous  PONV Risk Score and Plan: 3 and Propofol infusion, TIVA and Treatment may vary due to age or medical condition  Airway Management Planned: Nasal Cannula  Additional Equipment:   Intra-op Plan:   Post-operative Plan:   Informed Consent: I have reviewed the patients History and Physical, chart, labs and discussed the procedure including the risks, benefits and alternatives for the proposed anesthesia with the patient or authorized representative who has indicated his/her understanding and acceptance.       Plan Discussed with:   Anesthesia Plan Comments:         Anesthesia Quick Evaluation

## 2019-10-06 NOTE — Anesthesia Postprocedure Evaluation (Signed)
Anesthesia Post Note  Patient: Dana Lucas  Procedure(s) Performed: COLONOSCOPY (N/A )  Patient location during evaluation: Endoscopy Anesthesia Type: General Level of consciousness: awake and alert Pain management: pain level controlled Vital Signs Assessment: post-procedure vital signs reviewed and stable Respiratory status: spontaneous breathing and respiratory function stable Cardiovascular status: stable Anesthetic complications: no     Last Vitals:  Vitals:   10/06/19 1130 10/06/19 1132  BP: (!) 106/46 106/64  Pulse: 68   Resp: 16 18  Temp: 36.9 C   SpO2: 95% 96%    Last Pain:  Vitals:   10/06/19 1130  TempSrc: Temporal  PainSc: 0-No pain                 Tc [place] K

## 2019-10-06 NOTE — Progress Notes (Signed)
PROGRESS NOTE    Dana Lucas  QHU:[id number] DOB: 1950-12-21 DOA: 10/05/2019 PCP: Unice Bailey, MD       Assessment & Plan:   Principal Problem:   GIB (gastrointestinal bleeding) Active Problems:   Hypertension   Diabetes mellitus without complication (HCC)   Stroke (HCC)   AKI (acute kidney injury) (HCC)   UTI (urinary tract infection)  GI hemorrhage: possibly due to diverticular bleed. S/p colonoscopy today that was normal. No specimens were collected. Continue on IVFs. Zofran prn for nausea/vomiting. H&H are stable  DM2: no A1c on record. Continue on lantus, SSI w/ accuchecks   Generalized weakness: PT consulted  Stroke: will restart plavix as per GI   AKI: Unknown baseline Cr. Possibly due to dehydration and UTI. Continue on IVFs  UTI: UA is positive, urine cx growing gram neg rods, sens pending. Continue on IV ceftriaxone  Essential hypertension: continue on amlodipine, carvedilol, clonidine & losartan. Hydralazine prn.   HLD: continue on statin     DVT prophylaxis: SCDs Code Status: full Family Communication:  Disposition Plan:    Consultants:   GI   Procedures: colonoscopy 10/06/19: normal    Antimicrobials: ceftriaxone    Subjective: Pt c/o fatigue  Objective: Vitals:   10/05/19 0900 10/05/19 1223 10/06/19 0016 10/06/19 0748  BP: (!) 148/72 (!) 164/77 (!) 161/77 (!) 171/84  Pulse: 60 67 67 69  Resp:  18 18 15   Temp:  98.1 F (36.7 C) 98.1 F (36.7 C) 98 F (36.7 C)  TempSrc:  Oral Oral Oral  SpO2: 98% 94% 100% 99%  Weight:      Height:        Intake/Output Summary (Last 24 hours) at 10/06/2019 0801 Last data filed at 10/06/2019 0714 Gross per 24 hour  Intake 2501.25 ml  Output 1000 ml  Net 1501.25 ml   Filed Weights   10/05/19 12/03/19  Weight: 86.2 kg    Examination:  General exam: Appears calm and comfortable  Respiratory system: Clear to auscultation. Respiratory effort normal. Cardiovascular system: S1 & S2 +. No   rubs, gallops or clicks.  Gastrointestinal system: Abdomen is obese, soft and nontender.Normal bowel sounds heard. Central nervous system: Alert and oriented. Moves all 4 extremities Psychiatry: Judgement and insight appear normal. Flat mood and affect     Data Reviewed: I have personally reviewed following labs and imaging studies  CBC: Recent Labs  Lab 10/05/19 0656 10/05/19 1259 10/05/19 1505 10/05/19 2050 10/06/19 0315  WBC 10.0 9.1 9.8 13.0* 10.2  HGB 12.4 13.0 13.2 12.7 12.9  HCT 39.3 39.2 39.4 36.7 40.5  MCV 92.3 88.5 87.8 85.0 90.8  PLT 288 280 291 282 285   Basic Metabolic Panel: Recent Labs  Lab 10/05/19 0656 10/06/19 0315  NA 140 141  K 3.7 3.7  CL 107 108  CO2 19* 22  GLUCOSE 172* 94  BUN 33* 21  CREATININE 1.89* 1.62*  CALCIUM 9.1 8.9   GFR: Estimated Creatinine Clearance: 33.4 mL/min (A) (by C-G formula based on SCr of 1.62 mg/dL (H)). Liver Function Tests: Recent Labs  Lab 10/05/19 0656  AST 20  ALT 18  ALKPHOS 177*  BILITOT 0.5  PROT 8.3*  ALBUMIN 3.8   No results for input(s): LIPASE, AMYLASE in the last 168 hours. No results for input(s): AMMONIA in the last 168 hours. Coagulation Profile: Recent Labs  Lab 10/05/19 0656  INR 0.9   Cardiac Enzymes: No results for input(s): CKTOTAL, CKMB, CKMBINDEX, TROPONINI in the last 168  hours. BNP (last 3 results) No results for input(s): PROBNP in the last 8760 hours. HbA1C: Recent Labs    10/05/19 0656  HGBA1C 7.5*   CBG: Recent Labs  Lab 10/05/19 2042 10/06/19 0019 10/06/19 0431 10/06/19 0746  GLUCAP 99 76 97 95   Lipid Profile: No results for input(s): CHOL, HDL, LDLCALC, TRIG, CHOLHDL, LDLDIRECT in the last 72 hours. Thyroid Function Tests: No results for input(s): TSH, T4TOTAL, FREET4, T3FREE, THYROIDAB in the last 72 hours. Anemia Panel: No results for input(s): VITAMINB12, FOLATE, FERRITIN, TIBC, IRON, RETICCTPCT in the last 72 hours. Sepsis Labs: No results for input(s):  PROCALCITON, LATICACIDVEN in the last 168 hours.  Recent Results (from the past 240 hour(s))  SARS CORONAVIRUS 2 (TAT 6-24 HRS) Nasopharyngeal Nasopharyngeal Swab     Status: None   Collection Time: 10/05/19  7:11 AM   Specimen: Nasopharyngeal Swab  Result Value Ref Range Status   SARS Coronavirus 2 NEGATIVE NEGATIVE Final    Comment: (NOTE) SARS-CoV-2 target nucleic acids are NOT DETECTED. The SARS-CoV-2 RNA is generally detectable in upper and lower respiratory specimens during the acute phase of infection. Negative results do not preclude SARS-CoV-2 infection, do not rule out co-infections with other pathogens, and should not be used as the sole basis for treatment or other patient management decisions. Negative results must be combined with clinical observations, patient history, and epidemiological information. The expected result is Negative. Fact Sheet for Patients: SugarRoll.be Fact Sheet for Healthcare Providers: https://www.woods-mathews.com/ This test is not yet approved or cleared by the [place] FDA and  has been authorized for detection and/or diagnosis of SARS-CoV-2 by FDA under an Emergency Use Authorization (EUA). This EUA will remain  in effect (meaning this test can be used) for the duration of the COVID-19 declaration under Section 56 4(b)(1) of the Act, 21 U.S.C. section 360bbb-3(b)(1), unless the authorization is terminated or revoked sooner. Performed at [facility], [place] [address], [place], [place] [address]          Radiology Studies: No results found.      Scheduled Meds: . amLODipine  10 mg Oral Daily  . atorvastatin  80 mg Oral QHS  . carvedilol  12.5 mg Oral BID WC  . [START ON 10/08/2019] cloNIDine  0.1 mg Transdermal Q Tue  . insulin aspart  0-9 Units Subcutaneous Q4H  . insulin glargine  30 Units Subcutaneous QHS  . losartan  25 mg Oral Daily  . mirabegron ER  50 mg Oral Daily  .  pantoprazole (PROTONIX) IV  40 mg Intravenous Q12H   Continuous Infusions: . sodium chloride 75 mL/hr at 10/06/19 0500  . cefTRIAXone (ROCEPHIN)  IV 1 g (10/05/19 1845)     LOS: 0 days    Time spent: 30 mins    Wyvonnia Dusky, MD [facility] Pager [phone]  If 7PM-7AM, please contact night-coverage [email or web address] Password [email or web address] 10/06/2019, 8:01 AM

## 2019-10-06 NOTE — Transfer of Care (Signed)
Immediate Anesthesia Transfer of Care Note  Patient: Dana Lucas  Procedure(s) Performed: COLONOSCOPY (N/A )  Patient Location: Endoscopy Unit  Anesthesia Type:General  Level of Consciousness: drowsy and patient cooperative  Airway & Oxygen Therapy: Patient Spontanous Breathing  Post-op Assessment: Report given to RN and Post -op Vital signs reviewed and stable  Post vital signs: Reviewed and stable  Last Vitals:  Vitals Value Taken Time  BP 106/64 10/06/19 1132  Temp    Pulse 66 10/06/19 1132  Resp 24 10/06/19 1132  SpO2 95 % 10/06/19 1132  Vitals shown include unvalidated device data.  Last Pain:  Vitals:   10/06/19 1105  TempSrc: Temporal  PainSc: 0-No pain         Complications: No apparent anesthesia complications

## 2019-10-06 NOTE — Op Note (Signed)
[facility] Gastroenterology Patient Name: Dana Lucas Procedure Date: 10/06/2019 11:05 AM MRN: [id number] Account #: [id number] Date of Birth: [birth year] Admit Type: Inpatient Age: 69 Room: [facility] ENDO ROOM 4 Gender: Female Note Status: Finalized Procedure:             Colonoscopy Indications:           Hematochezia Providers:             Benay Pike. Alice Reichert MD, MD Referring MD:          Benjiman Core, MD (Referring MD) Medicines:             Propofol per Anesthesia Complications:         No immediate complications. Procedure:             Pre-Anesthesia Assessment:                        - The risks and benefits of the procedure and the                         sedation options and risks were discussed with the                         patient. All questions were answered and informed                         consent was obtained.                        - Patient identification and proposed procedure were                         verified prior to the procedure by the nurse. The                         procedure was verified in the procedure room.                        - ASA Grade Assessment: III - A patient with severe                         systemic disease.                        - After reviewing the risks and benefits, the patient                         was deemed in satisfactory condition to undergo the                         procedure.                        After obtaining informed consent, the colonoscope was                         passed under direct vision. Throughout the procedure,                         the patient's blood pressure, pulse, and oxygen  saturations were monitored continuously. The                         Colonoscope was introduced through the anus and                         advanced to the the cecum, identified by appendiceal                         orifice and ileocecal valve. The colonoscopy was             performed without difficulty. The patient tolerated                         the procedure well. The quality of the bowel                         preparation was excellent. The ileocecal valve,                         appendiceal orifice, and rectum were photographed. Findings:      The perianal and digital rectal examinations were normal. Pertinent       negatives include normal sphincter tone and no palpable rectal lesions.      The entire examined colon appeared normal on direct and retroflexion       views. Impression:            - The entire examined colon is normal on direct and                         retroflexion views.                        - No specimens collected.                        - It is still possible the patient had a diverticular                         bleed, though none were obvious today on my                         examination. The only exam that would effectively rule                         out diverticular disease would be a contrasted CT or                         barium enema, which at this point, appears                         unnecessary. Since there was no hemodynamically                         significant bleeding, would observe for now.                        Advise further workup if any gross, recurrent bleeding. Recommendation:        - Return patient  to hospital ward for possible                         discharge same day.                        - Resume Plavix (clopidogrel) at prior dose today.                         Refer to managing physician for further adjustment of                         therapy.                        - The findings and recommendations were discussed with                         the patient. Procedure Code(s):     --- Professional ---                        [phone], Colonoscopy, flexible; diagnostic, including                         collection of specimen(s) by brushing or washing, when                          performed (separate procedure) Diagnosis Code(s):     --- Professional ---                        K92.1, Melena (includes Hematochezia) CPT copyright 2019 American Medical Association. All rights reserved. The codes documented in this report are preliminary and upon coder review may  be revised to meet current compliance requirements. Stanton Kidney MD, MD 10/06/2019 11:37:46 AM This report has been signed electronically. Number of Addenda: 0 Note Initiated On: 10/06/2019 11:05 AM Scope Withdrawal Time: 0 hours 8 minutes 27 seconds  Total Procedure Duration: 0 hours 12 minutes 15 seconds  Estimated Blood Loss:  Estimated blood loss: none.      [facility]

## 2019-10-06 NOTE — Evaluation (Signed)
Physical Therapy Evaluation Patient Details Name: Dana Lucas MRN: [id number] DOB: August 01, 1951 Today's Date: 10/06/2019   History of Present Illness  Pt is a 69 y.o. female with medical history significant of hypertension, diabetes mellitus, stroke with left sided weakness, who presents with rectal bleeding.  MD assessment includes: GI bleed, HTN, DM, h/o CVA, AKI, and UTI.    Clinical Impression  Pt presented with deficits in strength, transfers, mobility, gait, balance, and activity tolerance.  Pt required min A with bed mobility tasks and extra time and effort with transfers.  Pt presented with min instability during amb with a RW but was able to self-correct.  [facility] but that [facility] has limited [facility] secondary to coronavirus and the pt would like more therapy.  Pt would benefit from HHPT services but only if that does not interfere with [facility] eligibility.      Follow Up Recommendations Other (comment)(Pt currently with [facility] but with limited sessions now secondary to coronavirus.  Would like HHPT if that does not interfere with her eligibility with [facility].)    Equipment Recommendations  None recommended by PT    Recommendations for Other Services       Precautions / Restrictions Precautions Precautions: Fall Restrictions Weight Bearing Restrictions: No      Mobility  Bed Mobility Overal bed mobility: Needs Assistance Bed Mobility: Supine to Sit;Sit to Supine     Supine to sit: Min assist Sit to supine: Min assist   General bed mobility comments: Min A for trunk control  Transfers Overall transfer level: Needs assistance Equipment used: Rolling walker (2 wheeled) Transfers: Sit to/from Stand Sit to Stand: Min guard         General transfer comment: Multiple rocking attempts to stand but ultimately was able to stand without physical assist or LOB  Ambulation/Gait Ambulation/Gait assistance: Min guard Gait Distance (Feet): 20  Feet Assistive device: Rolling walker (2 wheeled) Gait Pattern/deviations: Step-through pattern;Decreased step length - right;Decreased step length - left Gait velocity: decreased   General Gait Details: Slow cadence with short B step length and min instability that the pt was able to self-correct  Stairs            Wheelchair Mobility    Modified Rankin (Stroke Patients Only)       Balance Overall balance assessment: Needs assistance Sitting-balance support: Feet supported;Single extremity supported Sitting balance-Leahy Scale: Good     Standing balance support: Bilateral upper extremity supported Standing balance-Leahy Scale: Fair                               Pertinent Vitals/Pain Pain Assessment: No/denies pain    Home Living Family/patient expects to be discharged to:: Private residence Living Arrangements: Children Available Help at Discharge: Family;Available 24 hours/day Type of Home: House Home Access: Ramped entrance     Home Layout: One level Home Equipment: Grab bars - toilet;Walker - 2 wheels;Hospital bed;Toilet riser;Electric scooter Additional Comments: [id number]; lives with son and dtr in law    Prior Function Level of Independence: Independent with assistive device(s)         Comments: Mod Ind amb HH distances with a RW, electric scooter in the community, no fall history, Ind with ADLs     Hand Dominance        Extremity/Trunk Assessment   Upper Extremity Assessment Upper Extremity Assessment: Generalized weakness;LUE deficits/detail LUE Deficits / Details: Chronic weakness secondary  to CVA, at baseline per pt    Lower Extremity Assessment Lower Extremity Assessment: Generalized weakness;LLE deficits/detail LLE Deficits / Details: Chronic weakness secondary to CVA, at baseline per pt       Communication   Communication: No difficulties  Cognition Arousal/Alertness: Awake/alert Behavior During Therapy: WFL for tasks  assessed/performed Overall Cognitive Status: Within Functional Limits for tasks assessed                                        General Comments      Exercises Total Joint Exercises Ankle Circles/Pumps: AROM;Right;10 reps;15 reps;Strengthening Quad Sets: Strengthening;Both;10 reps;15 reps Gluteal Sets: Strengthening;Both;10 reps;15 reps Short Arc Quad: AROM;AAROM;Both;10 reps;15 reps;Strengthening Hip ABduction/ADduction: AROM;AAROM;Both;10 reps Straight Leg Raises: AROM;AAROM;Both;10 reps Long Arc Quad: Strengthening;Both;10 reps Knee Flexion: Strengthening;Both;10 reps   Assessment/Plan    PT Assessment Patient needs continued PT services  PT Problem List Decreased strength;Decreased activity tolerance;Decreased balance;Decreased mobility       PT Treatment Interventions DME instruction;Gait training;Functional mobility training;Therapeutic activities;Therapeutic exercise;Balance training;Patient/family education    PT Goals (Current goals can be found in the Care Plan section)  Acute Rehab PT Goals Patient Stated Goal: To get back home PT Goal Formulation: With patient Time For Goal Achievement: 10/19/19 Potential to Achieve Goals: Good    Frequency Min 2X/week   Barriers to discharge        Co-evaluation               AM-PAC PT "6 Clicks" Mobility  Outcome Measure Help needed turning from your back to your side while in a flat bed without using bedrails?: A Little Help needed moving from lying on your back to sitting on the side of a flat bed without using bedrails?: A Little Help needed moving to and from a bed to a chair (including a wheelchair)?: A Little Help needed standing up from a chair using your arms (e.g., wheelchair or bedside chair)?: A Little Help needed to walk in hospital room?: A Little Help needed climbing 3-5 steps with a railing? : A Lot 6 Click Score: 17    End of Session Equipment Utilized During Treatment: Gait  belt Activity Tolerance: Patient tolerated treatment well Patient left: in chair;with call bell/phone within reach;with chair alarm set Nurse Communication: Mobility status PT Visit Diagnosis: Unsteadiness on feet (R26.81);Difficulty in walking, not elsewhere classified (R26.2);Muscle weakness (generalized) (M62.81)    Time: 2130-8657 PT Time Calculation (min) (ACUTE ONLY): 35 min   Charges:   PT Evaluation $PT Eval Moderate Complexity: 1 Mod PT Treatments $Therapeutic Exercise: 8-22 mins        D. Royetta Asal PT, DPT 10/06/19, 4:40 PM

## 2019-10-07 ENCOUNTER — Encounter: Payer: Self-pay | Admitting: *Deleted

## 2019-10-07 DIAGNOSIS — N3000 Acute cystitis without hematuria: Secondary | ICD-10-CM | POA: Diagnosis not present

## 2019-10-07 DIAGNOSIS — K922 Gastrointestinal hemorrhage, unspecified: Secondary | ICD-10-CM | POA: Diagnosis not present

## 2019-10-07 LAB — CBC
HCT: 35.5 % — ABNORMAL LOW (ref 36.0–46.0)
Hemoglobin: 12 g/dL (ref 12.0–15.0)
MCH: 29.4 pg (ref 26.0–34.0)
MCHC: 33.8 g/dL (ref 30.0–36.0)
MCV: 87 fL (ref 80.0–100.0)
Platelets: 261 10*3/uL (ref 150–400)
RBC: 4.08 MIL/uL (ref 3.87–5.11)
RDW: 13 % (ref 11.5–15.5)
WBC: 8.1 10*3/uL (ref 4.0–10.5)
nRBC: 0 % (ref 0.0–0.2)

## 2019-10-07 LAB — BASIC METABOLIC PANEL
Anion gap: 9 (ref 5–15)
BUN: 20 mg/dL (ref 8–23)
CO2: 21 mmol/L — ABNORMAL LOW (ref 22–32)
Calcium: 8.5 mg/dL — ABNORMAL LOW (ref 8.9–10.3)
Chloride: 108 mmol/L (ref 98–111)
Creatinine, Ser: 1.73 mg/dL — ABNORMAL HIGH (ref 0.44–1.00)
GFR calc Af Amer: 34 mL/min — ABNORMAL LOW (ref >60)
GFR calc non Af Amer: 30 mL/min — ABNORMAL LOW (ref >60)
Glucose, Bld: 182 mg/dL — ABNORMAL HIGH (ref 70–99)
Potassium: 3.9 mmol/L (ref 3.5–5.1)
Sodium: 138 mmol/L (ref 135–145)

## 2019-10-07 LAB — URINE CULTURE: Culture: >=100000 — AB

## 2019-10-07 LAB — GLUCOSE, CAPILLARY
Glucose-Capillary: 69 mg/dL — ABNORMAL LOW (ref 70–99)
Glucose-Capillary: 182 mg/dL — ABNORMAL HIGH (ref 70–99)
Glucose-Capillary: 146 mg/dL — ABNORMAL HIGH (ref 70–99)
Glucose-Capillary: 160 mg/dL — ABNORMAL HIGH (ref 70–99)

## 2019-10-07 MED ORDER — CIPROFLOXACIN HCL 500 MG PO TABS: 500.0000 mg | ORAL_TABLET | Freq: Two times a day (BID) | ORAL | 0 refills | Status: AC

## 2019-10-07 NOTE — TOC Progression Note (Signed)
Transition of Care [facility]) - Progression Note    Patient Details  Name: Dana Lucas MRN: [id number] Date of Birth: 1951-08-07  Transition of [facility]) CM/SW Contact  Barrie Dunker, RN Phone Number: 10/07/2019, 12:09 PM  Clinical Narrative:    Spoke with Detrice from [facility] and they will provide transportation back home and will arrange all [facility] services and DME that is needed, The nurse will call [phone] when ready and ask for transport, she has been notified      Expected Discharge Plan and Services           Expected Discharge Date: 10/07/19                                     Social Determinants of Health (SDOH) Interventions    Readmission Risk Interventions No flowsheet data found.

## 2019-10-07 NOTE — Discharge Summary (Signed)
Physician Discharge Summary  Dana Lucas FVC:[id number] DOB: 04-26-1951 DOA: 10/05/2019  PCP: Unice Bailey, MD  Admit date: 10/05/2019 Discharge date: 10/07/2019  Admitted From:home  Disposition: home w/ home health   Recommendations for Outpatient Follow-up:  1. Follow up with PCP in 1-2 weeks 2. F/u w/ GI in 1-2 weeks   Home Health:yes  Equipment/Devices:  Discharge Condition:stable CODE STATUS:full  Diet recommendation: Heart Healthy  Brief/Interim Summary: HPI was taken from Dr. Clyde Lundborg: Dana Lucas is a 69 y.o. female with medical history significant of hypertension, diabetes mellitus, stroke with left sided weakness, who presents with rectal bleeding.  Pt sates that he had a large amount of rectal bleeding with bright red blood. She felt like she was having diarrhea but it was just blood.  No nausea, vomiting, abdominal pain.  No chest pain, shortness of breath, denies lightheadedness.  Patient reports of burning on urination.  She has chronic left-sided weakness from a previous stroke which has not changed.  ED Course: pt was found to have hemoglobin 12.4 (no baseline hemoglobin available), AKI with creatinine 1.89, BUN 33 (no baseline creatinine available), INR 0.9, positive urinalysis (hazy appearance, trace amount of leukocyte, many bacteria, WBC 21-50), pending COVID-19 PCR, temperature normal, blood pressure 148/73, heart rate 61, oxygen saturation 98% on room air.  Patient is placed on MedSurg bed for observation.  GI, Dr. Norma Fredrickson was consulted.  Hospital course from Dr. Wilfred Lacy 1/3/-10/07/19: GI was consulted for GI hemorrhage. Pt had a colonoscopy which was normal and no specimens were collected. Pt was restarted back on her plavix as per GI. The etiology of the GI hemorrhage was unclear, but likely secondary to diverticular bleed. Pt did not require any transfusions while in the hospital. Of note, PT saw the pt and recommended home health. Home health was set up by CM  prior to d/c. Also, pt was found to have UTI secondary to e.coli and was treated w/ IV abxs inpatient and transitioned to po abx (cipro) at d/c as per sens.   Discharge Diagnoses:  Principal Problem:   GIB (gastrointestinal bleeding) Active Problems:   Hypertension   Diabetes mellitus without complication (HCC)   Stroke (HCC)   AKI (acute kidney injury) (HCC)   UTI (urinary tract infection)   GI hemorrhage: possibly due to diverticular bleed. S/p colonoscopy today that was normal. No specimens were collected. Continue on IVFs. Zofran prn for nausea/vomiting. H&H are stable  DM2: HbA1c 7.5. Well controlled Continue on lantus, SSI w/ accuchecks   Generalized weakness: PT recommends home health PT. Home health orders placed.  Stroke: will continue on plavix as per GI   AKI: Unknown baseline Cr. Possibly due to dehydration and UTI. Continue on IVFs. Encourage water intake at home   UTI: UA is positive, urine cx growing e. coli. Transition to po cipro   Essential hypertension: continue on amlodipine, carvedilol, clonidine & losartan. Hydralazine prn.   HLD: continue on statin   Discharge Instructions  Discharge Instructions    Diet - low sodium heart healthy   Complete by: As directed    Diet Carb Modified   Complete by: As directed    Discharge instructions   Complete by: As directed    F/u w/ PCP in 1-2 weeks; F/u w/ GI in 2-3 weeks   Increase activity slowly   Complete by: As directed      Allergies as of 10/07/2019      Reactions   Lisinopril Cough  Medication List    TAKE these medications   alendronate 70 MG tablet Commonly known as: FOSAMAX Take 70 mg by mouth every Tuesday. Take with a full glass of water on an empty stomach.   amLODipine 10 MG tablet Commonly known as: NORVASC Take 10 mg by mouth daily.   Aspercreme w/Lidocaine 4 % Crea Generic drug: Lidocaine HCl Apply 1 application topically 3 (three) times daily as needed (pain). (apply to left  calf)   atorvastatin 80 MG tablet Commonly known as: LIPITOR Take 80 mg by mouth at bedtime.   carvedilol 12.5 MG tablet Commonly known as: COREG Take 12.5 mg by mouth 2 (two) times daily.   ciprofloxacin 500 MG tablet Commonly known as: Cipro Take 1 tablet (500 mg total) by mouth 2 (two) times daily for 3 days.   cloNIDine 0.1 mg/24hr patch Commonly known as: CATAPRES - Dosed in mg/24 hr Place 0.1 mg onto the skin every Tuesday.   clopidogrel 75 MG tablet Commonly known as: PLAVIX Take 75 mg by mouth daily.   insulin aspart 100 UNIT/ML injection Commonly known as: novoLOG Inject 6-10 Units into the skin See admin instructions. Inject under the skin three times daily according to sliding scale: 150-200: 6u 201-275: 8u >275: 10u   insulin glargine 100 UNIT/ML injection Commonly known as: LANTUS Inject 46 Units into the skin at bedtime.   losartan 25 MG tablet Commonly known as: COZAAR Take 25 mg by mouth daily.   Myrbetriq 50 MG Tb24 tablet Generic drug: mirabegron ER Take 50 mg by mouth daily.      Follow-up Information    Velia MeyerJohnson, Michelle C, PA-C On 11/11/2019.   Why: follow up @11 :30am Contact information: [address] [facility] [place] [id number] [phone]          Allergies  Allergen Reactions  . Lisinopril Cough    Consultations:  GI    Procedures/Studies: No results found. Colonoscopy 10/06/19: normal, no specimens collected    Subjective: Pt c/o malaise   Discharge Exam: Vitals:   10/07/19 0404 10/07/19 0800  BP: (!) 171/78 (!) 142/74  Pulse: 72 63  Resp: 16   Temp: 98.1 F (36.7 C) 98.7 F (37.1 C)  SpO2: 97% 100%   Vitals:   10/06/19 1524 10/07/19 0001 10/07/19 0404 10/07/19 0800  BP: (!) 141/76 (!) 165/79 (!) 171/78 (!) 142/74  Pulse: 70 74 72 63  Resp: 16 16 16    Temp: 98.3 F (36.8 C) 98.9 F (37.2 C) 98.1 F (36.7 C) 98.7 F (37.1 C)  TempSrc: Oral Oral Oral Oral  SpO2: 97% 98% 97% 100%   Weight:      Height:        General: Pt is alert, awake, not in acute distress Cardiovascular:, S1/S2 +, no rubs, no gallops Respiratory: diminished breath sounds b/l otherwise clear, no wheezing, no rhonchi Abdominal: Soft, NT, ND, bowel sounds + Extremities: no edema, no cyanosis    The results of significant diagnostics from this hospitalization (including imaging, microbiology, ancillary and laboratory) are listed below for reference.     Microbiology: Recent Results (from the past 240 hour(s))  SARS CORONAVIRUS 2 (TAT 6-24 HRS) Nasopharyngeal Nasopharyngeal Swab     Status: None   Collection Time: 10/05/19  7:11 AM   Specimen: Nasopharyngeal Swab  Result Value Ref Range Status   SARS Coronavirus 2 NEGATIVE NEGATIVE Final    Comment: (NOTE) SARS-CoV-2 target nucleic acids are NOT DETECTED. The SARS-CoV-2 RNA is generally detectable in  upper and lower respiratory specimens during the acute phase of infection. Negative results do not preclude SARS-CoV-2 infection, do not rule out co-infections with other pathogens, and should not be used as the sole basis for treatment or other patient management decisions. Negative results must be combined with clinical observations, patient history, and epidemiological information. The expected result is Negative. Fact Sheet for Patients: SugarRoll.be Fact Sheet for Healthcare Providers: https://www.woods-mathews.com/ This test is not yet approved or cleared by the [place] FDA and  has been authorized for detection and/or diagnosis of SARS-CoV-2 by FDA under an Emergency Use Authorization (EUA). This EUA will remain  in effect (meaning this test can be used) for the duration of the COVID-19 declaration under Section 56 4(b)(1) of the Act, 21 U.S.C. section 360bbb-3(b)(1), unless the authorization is terminated or revoked sooner. Performed at [facility], [place] [address]  [place], [place] [address]   Today - UCx     Status: Abnormal   Collection Time: 10/05/19  7:15 AM   Specimen: Urine, Random  Result Value Ref Range Status   Specimen Description   Final    URINE, RANDOM Performed at [facility], [address] [place], [place] [address]    Special Requests   Final    NONE Performed at [facility], [address] [place], [place] [address]    Culture >=100,000 COLONIES/mL ESCHERICHIA COLI (A)  Final   Report Status 10/07/2019 FINAL  Final   Organism ID, Bacteria ESCHERICHIA COLI (A)  Final      Susceptibility   Escherichia coli - MIC*    AMPICILLIN <=2 SENSITIVE Sensitive     CEFAZOLIN <=4 SENSITIVE Sensitive     CEFTRIAXONE <=0.25 SENSITIVE Sensitive     CIPROFLOXACIN <=0.25 SENSITIVE Sensitive     GENTAMICIN <=1 SENSITIVE Sensitive     IMIPENEM <=0.25 SENSITIVE Sensitive     NITROFURANTOIN <=16 SENSITIVE Sensitive     TRIMETH/SULFA <=20 SENSITIVE Sensitive     AMPICILLIN/SULBACTAM <=2 SENSITIVE Sensitive     PIP/TAZO <=4 SENSITIVE Sensitive     * >=100,000 COLONIES/mL ESCHERICHIA COLI  CULTURE, BLOOD (ROUTINE X 2) w Reflex to ID Panel     Status: None (Preliminary result)   Collection Time: 10/05/19  9:13 AM   Specimen: BLOOD  Result Value Ref Range Status   Specimen Description BLOOD RAC  Final   Special Requests   Final    BOTTLES DRAWN AEROBIC AND ANAEROBIC Blood Culture results may not be optimal due to an inadequate volume of blood received in culture bottles   Culture   Final    NO GROWTH 2 DAYS Performed at [facility], [address], [place], [place] [address]    Report Status PENDING  Incomplete  Culture, blood (single) w Reflex to ID Panel     Status: None (Preliminary result)   Collection Time: 10/05/19 12:59 PM   Specimen: BLOOD  Result Value Ref Range Status   Specimen Description BLOOD RT HAND  Final   Special Requests   Final    BOTTLES DRAWN AEROBIC AND ANAEROBIC Blood Culture adequate  volume   Culture   Final    NO GROWTH 2 DAYS Performed at [facility], [address] [place], [place] [address]    Report Status PENDING  Incomplete     Labs: BNP (last 3 results) No results for input(s): BNP in the last 8760 hours. Basic Metabolic Panel: Recent Labs  Lab 10/05/19 0656 10/06/19 0315  10/07/19 0435  NA 140 141 138  K 3.7 3.7 3.9  CL 107 108 108  CO2 19* 22 21*  GLUCOSE 172* 94 182*  BUN 33* 21 20  CREATININE 1.89* 1.62* 1.73*  CALCIUM 9.1 8.9 8.5*   Liver Function Tests: Recent Labs  Lab 10/05/19 0656  AST 20  ALT 18  ALKPHOS 177*  BILITOT 0.5  PROT 8.3*  ALBUMIN 3.8   No results for input(s): LIPASE, AMYLASE in the last 168 hours. No results for input(s): AMMONIA in the last 168 hours. CBC: Recent Labs  Lab 10/05/19 2050 10/06/19 0315 10/06/19 0935 10/06/19 1449 10/07/19 0435  WBC 13.0* 10.2 8.9 9.0 8.1  HGB 12.7 12.9 13.0 12.3 12.0  HCT 36.7 40.5 38.0 36.5 35.5*  MCV 85.0 90.8 86.2 87.7 87.0  PLT 282 285 284 252 261   Cardiac Enzymes: No results for input(s): CKTOTAL, CKMB, CKMBINDEX, TROPONINI in the last 168 hours. BNP: Invalid input(s): POCBNP CBG: Recent Labs  Lab 10/06/19 1637 10/06/19 2149 10/07/19 0351 10/07/19 0755 10/07/19 1140  GLUCAP 203* 173* 182* 146* 160*   D-Dimer No results for input(s): DDIMER in the last 72 hours. Hgb A1c Recent Labs    10/05/19 0656  HGBA1C 7.5*   Lipid Profile No results for input(s): CHOL, HDL, LDLCALC, TRIG, CHOLHDL, LDLDIRECT in the last 72 hours. Thyroid function studies No results for input(s): TSH, T4TOTAL, T3FREE, THYROIDAB in the last 72 hours.  Invalid input(s): FREET3 Anemia work up No results for input(s): VITAMINB12, FOLATE, FERRITIN, TIBC, IRON, RETICCTPCT in the last 72 hours. Urinalysis    Component Value Date/Time   COLORURINE YELLOW (A) 10/05/2019 0715   APPEARANCEUR HAZY (A) 10/05/2019 0715   LABSPEC 1.010 10/05/2019 0715   PHURINE 5.0  10/05/2019 0715   GLUCOSEU NEGATIVE 10/05/2019 0715   HGBUR NEGATIVE 10/05/2019 0715   BILIRUBINUR NEGATIVE 10/05/2019 0715   KETONESUR NEGATIVE 10/05/2019 0715   PROTEINUR 100 (A) 10/05/2019 0715   NITRITE NEGATIVE 10/05/2019 0715   LEUKOCYTESUR TRACE (A) 10/05/2019 0715   Sepsis Labs Invalid input(s): PROCALCITONIN,  WBC,  LACTICIDVEN Microbiology Recent Results (from the past 240 hour(s))  SARS CORONAVIRUS 2 (TAT 6-24 HRS) Nasopharyngeal Nasopharyngeal Swab     Status: None   Collection Time: 10/05/19  7:11 AM   Specimen: Nasopharyngeal Swab  Result Value Ref Range Status   SARS Coronavirus 2 NEGATIVE NEGATIVE Final    Comment: (NOTE) SARS-CoV-2 target nucleic acids are NOT DETECTED. The SARS-CoV-2 RNA is generally detectable in upper and lower respiratory specimens during the acute phase of infection. Negative results do not preclude SARS-CoV-2 infection, do not rule out co-infections with other pathogens, and should not be used as the sole basis for treatment or other patient management decisions. Negative results must be combined with clinical observations, patient history, and epidemiological information. The expected result is Negative. Fact Sheet for Patients: HairSlick.no Fact Sheet for Healthcare Providers: quierodirigir.com This test is not yet approved or cleared by the [place] FDA and  has been authorized for detection and/or diagnosis of SARS-CoV-2 by FDA under an Emergency Use Authorization (EUA). This EUA will remain  in effect (meaning this test can be used) for the duration of the COVID-19 declaration under Section 56 4(b)(1) of the Act, 21 U.S.C. section 360bbb-3(b)(1), unless the authorization is terminated or revoked sooner. Performed at [facility], [address], [place], [place] [address]   Today - UCx     Status: Abnormal   Collection Time: 10/05/19  7:15 AM   Specimen: Urine,  Random  Result Value Ref Range Status   Specimen Description   Final    URINE, RANDOM Performed at [facility], [address] [place], [place] [address]    Special Requests   Final    NONE Performed at [facility], [address] [place], [place] [address]    Culture >=100,000 COLONIES/mL ESCHERICHIA COLI (A)  Final   Report Status 10/07/2019 FINAL  Final   Organism ID, Bacteria ESCHERICHIA COLI (A)  Final      Susceptibility   Escherichia coli - MIC*    AMPICILLIN <=2 SENSITIVE Sensitive     CEFAZOLIN <=4 SENSITIVE Sensitive     CEFTRIAXONE <=0.25 SENSITIVE Sensitive     CIPROFLOXACIN <=0.25 SENSITIVE Sensitive     GENTAMICIN <=1 SENSITIVE Sensitive     IMIPENEM <=0.25 SENSITIVE Sensitive     NITROFURANTOIN <=16 SENSITIVE Sensitive     TRIMETH/SULFA <=20 SENSITIVE Sensitive     AMPICILLIN/SULBACTAM <=2 SENSITIVE Sensitive     PIP/TAZO <=4 SENSITIVE Sensitive     * >=100,000 COLONIES/mL ESCHERICHIA COLI  CULTURE, BLOOD (ROUTINE X 2) w Reflex to ID Panel     Status: None (Preliminary result)   Collection Time: 10/05/19  9:13 AM   Specimen: BLOOD  Result Value Ref Range Status   Specimen Description BLOOD RAC  Final   Special Requests   Final    BOTTLES DRAWN AEROBIC AND ANAEROBIC Blood Culture results may not be optimal due to an inadequate volume of blood received in culture bottles   Culture   Final    NO GROWTH 2 DAYS Performed at [facility], [address], [place], [place] [address]    Report Status PENDING  Incomplete  Culture, blood (single) w Reflex to ID Panel     Status: None (Preliminary result)   Collection Time: 10/05/19 12:59 PM   Specimen: BLOOD  Result Value Ref Range Status   Specimen Description BLOOD RT HAND  Final   Special Requests   Final    BOTTLES DRAWN AEROBIC AND ANAEROBIC Blood Culture adequate volume   Culture   Final    NO GROWTH 2 DAYS Performed at [facility], [address] [place],  [place] [address]    Report Status PENDING  Incomplete     Time coordinating discharge: Over 30 minutes  SIGNED:   Charise Killian, MD  [facility] 10/07/2019, 2:28 PM Pager   If 7PM-7AM, please contact night-coverage [email or web address] Password [email or web address]

## 2019-10-07 NOTE — Progress Notes (Signed)
Report called to pace , patient will be ready to be picked up per pace by 145pm

## 2019-10-10 LAB — CULTURE, BLOOD (ROUTINE X 2): Culture: NO GROWTH

## 2019-10-10 LAB — CULTURE, BLOOD (SINGLE)
Culture: NO GROWTH
Special Requests: ADEQUATE

## 2019-10-15 ENCOUNTER — Encounter: Payer: Self-pay | Admitting: *Deleted

## 2019-12-02 IMAGING — MG DIGITAL SCREENING BILATERAL MAMMOGRAM WITH TOMO AND CAD
6 of 10 series · 6 of 30 positions shown · non-contrast
Comparison: Previous exam(s).

CLINICAL DATA: Screening.

EXAM:
DIGITAL SCREENING BILATERAL MAMMOGRAM WITH TOMO AND CAD

[L CC synth-2D]
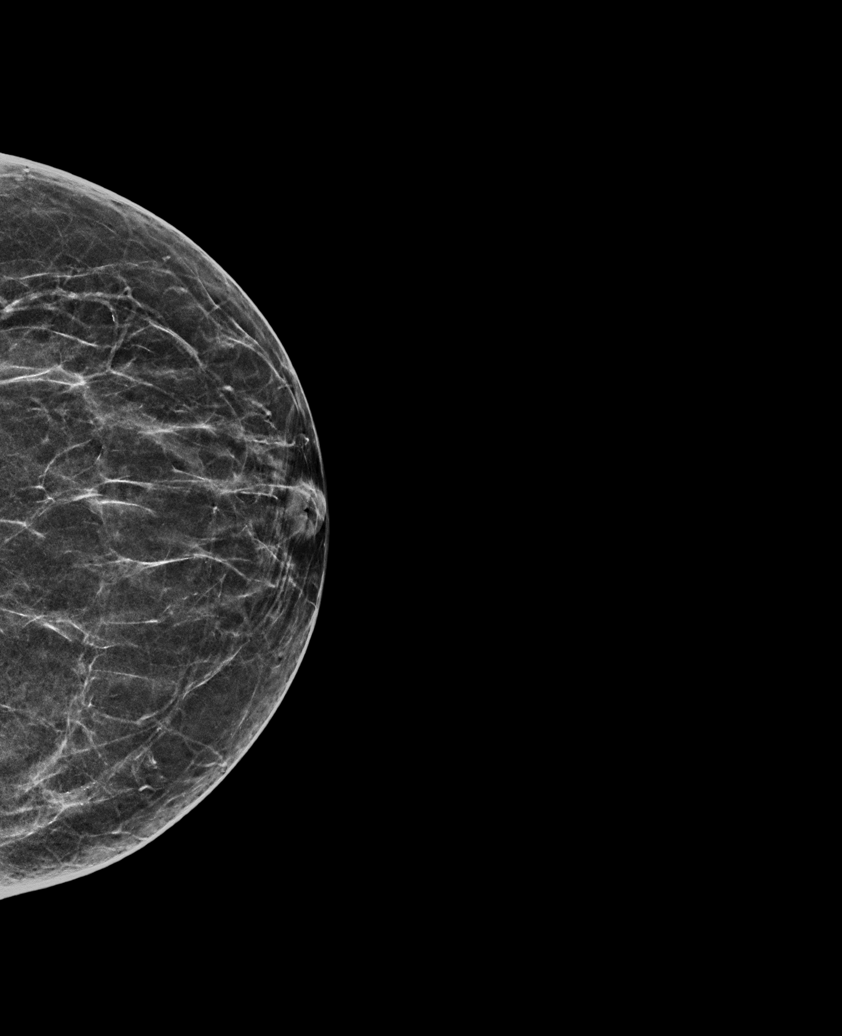

[R CC synth-2D]
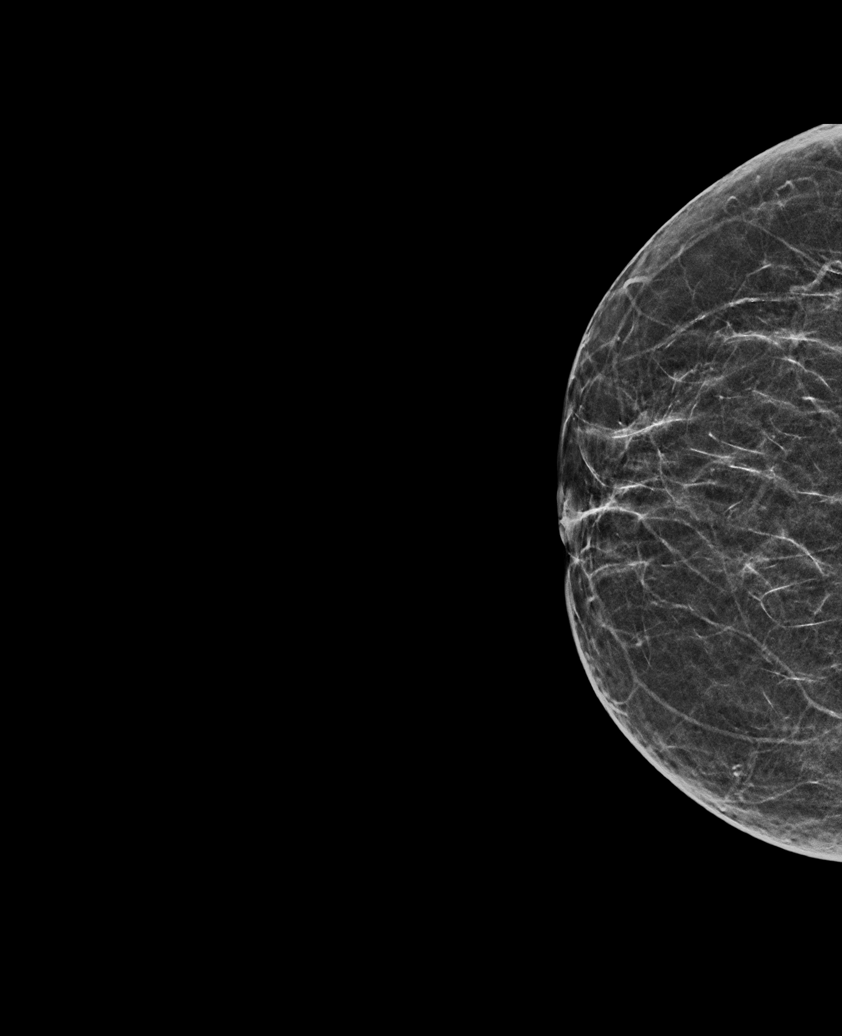

[R MLO synth-2D (1 of 2)]
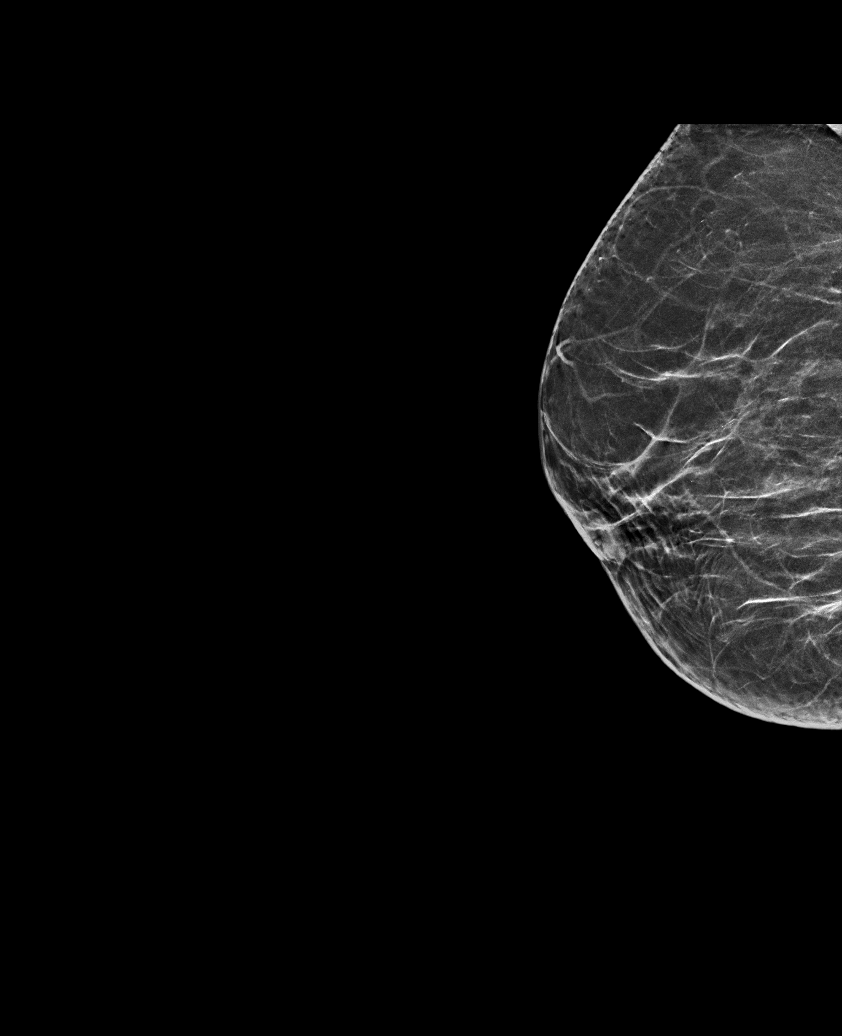

[R MLO synth-2D (2 of 2)]
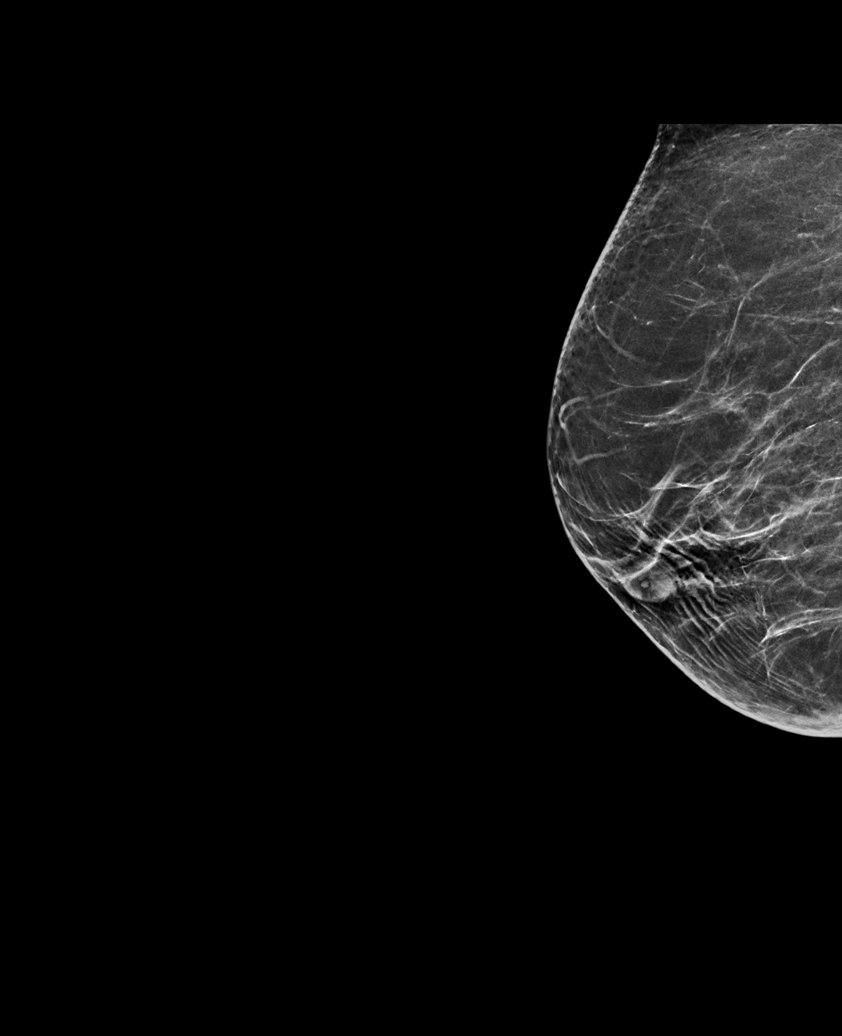

[L MLO synth-2D]
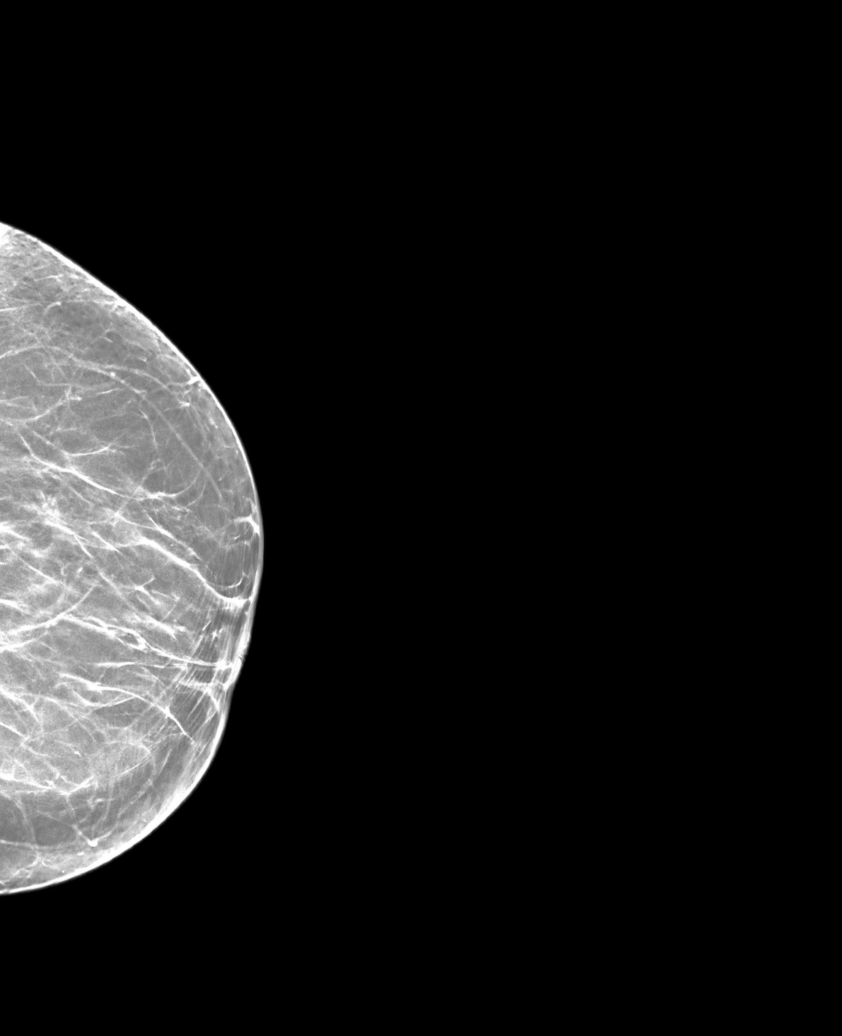

[R MLO tomo · tomo slice 26/51.0]
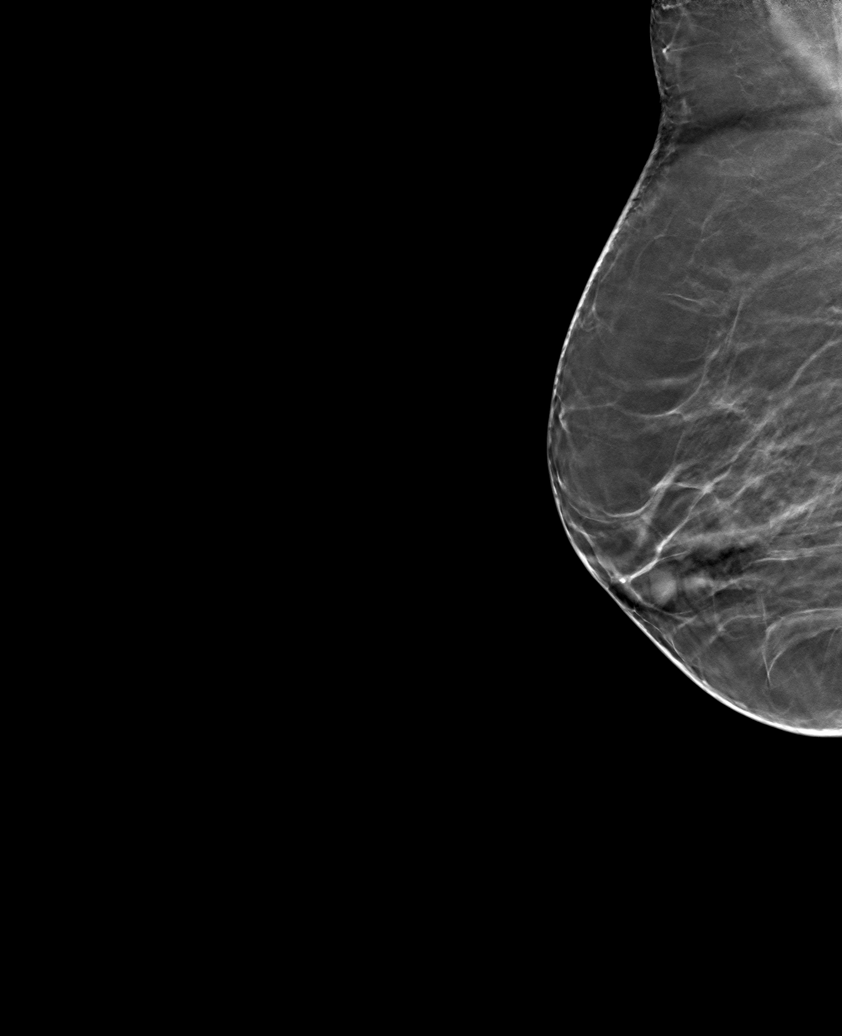

[6 of 30 positions shown; findings below may reference images not displayed]

ACR Breast Density Category b: There are scattered areas of
fibroglandular density.
FINDINGS: There are no findings suspicious for malignancy. Images were
processed with CAD.
IMPRESSION: No mammographic evidence of malignancy. A result letter of this
screening mammogram will be mailed directly to the patient.

RECOMMENDATION:
Screening mammogram in one year. (Code:[id number])

BI-RADS CATEGORY  1: Negative.

## 2021-10-18 ENCOUNTER — Other Ambulatory Visit: Payer: Self-pay | Admitting: Family Medicine

## 2021-10-18 DIAGNOSIS — N6310 Unspecified lump in the right breast, unspecified quadrant: Secondary | ICD-10-CM

## 2021-11-08 ENCOUNTER — Other Ambulatory Visit: Payer: Self-pay | Admitting: Family Medicine

## 2021-11-08 DIAGNOSIS — N6310 Unspecified lump in the right breast, unspecified quadrant: Secondary | ICD-10-CM

## 2021-12-06 ENCOUNTER — Other Ambulatory Visit: Payer: Self-pay

## 2021-12-06 ENCOUNTER — Ambulatory Visit
Admission: RE | Admit: 2021-12-06 | Discharge: 2021-12-06 | Disposition: A | Discharge status: Home / Self Care | Payer: Medicare (Managed Care) | Source: Ambulatory Visit | Attending: Family Medicine | Admitting: Family Medicine

## 2021-12-06 DIAGNOSIS — N6315 Unspecified lump in the right breast, overlapping quadrants: Secondary | ICD-10-CM | POA: Insufficient documentation

## 2021-12-06 DIAGNOSIS — N6310 Unspecified lump in the right breast, unspecified quadrant: Secondary | ICD-10-CM

## 2021-12-20 ENCOUNTER — Telehealth: Payer: Self-pay

## 2021-12-20 ENCOUNTER — Other Ambulatory Visit: Payer: Self-pay

## 2021-12-20 NOTE — Telephone Encounter (Signed)
CALLED PATIENT NO ANSWER LEFT VOICEMAIL FOR A CALL BACK ?NEW PATIENT ANY ?

## 2021-12-21 ENCOUNTER — Telehealth: Payer: Self-pay

## 2021-12-21 NOTE — Telephone Encounter (Signed)
CALLED PATIENT NO ANSWER LEFT VOICEMAIL FOR A CALL BACK °Letter sent °

## 2022-01-13 ENCOUNTER — Ambulatory Visit (INDEPENDENT_AMBULATORY_CARE_PROVIDER_SITE_OTHER): Payer: Medicare (Managed Care) | Admitting: Gastroenterology

## 2022-01-13 ENCOUNTER — Other Ambulatory Visit: Payer: Self-pay

## 2022-01-13 ENCOUNTER — Telehealth: Payer: Self-pay

## 2022-01-13 ENCOUNTER — Encounter: Payer: Self-pay | Admitting: Gastroenterology

## 2022-01-13 VITALS — BP 168/83 | HR 80 | Temp 98.9°F | Ht 61.0 in | Wt 189.0 lb

## 2022-01-13 DIAGNOSIS — R1310 Dysphagia, unspecified: Secondary | ICD-10-CM

## 2022-01-13 NOTE — Telephone Encounter (Signed)
Blood thinner request received from Dr. Hinda Lenis (Pts PCP).  Patient cannot stop blood thinner due to medical necessity. ?EGD has been canceled for 01/20/22. ? ?Thanks, ?[name] ?

## 2022-01-13 NOTE — Progress Notes (Signed)
?  ?Dana Mood MD, MRCP(U.K) ?[address]  ?[address]  ?[place], [place] [address]  ?Main: [phone]  ?Fax: [phone] ? ? ?Gastroenterology Consultation ? ?Referring Provider:     Bobbye Morton, MD ?Primary Care Physician:  Bobbye Morton, MD ?Primary Gastroenterologist:  Dr. Wyline Lucas  ?Reason for Consultation:     dysphagia  ?      ? HPI:   ?Dana Lucas is a 71 y.o. y/o female referred for consultation & management  by Dr. Purcell Mouton, Vergia Alberts, MD.   ? ? ?Dysphagia: ?Onset and any progression: Few months back and continues ?Frequency: On and off ?Foods affected : Solids ?Prior episodes of impaction: None ?History of asthma/allergy : None ?History of heartburn/Reflux : None ?Weight loss/weight gain : None ?Prior EGD: None ?PPI/H2 blocker use : None ?Smoking history :.  None ? ?She also states at times the food goes the wrong way when she eats all this began after her stroke.  It is associated with coughing. ? ?Past Medical History:  ?Diagnosis Date  ? Diabetes mellitus without complication (HCC)   ? Hypertension   ? Stroke [facility])   ? ? ?Past Surgical History:  ?Procedure Laterality Date  ? ABDOMINAL HYSTERECTOMY    ? COLONOSCOPY N/A 10/06/2019  ? Procedure: COLONOSCOPY;  Surgeon: Toledo, Boykin Nearing, MD;  Location: [facility] ENDOSCOPY;  Service: Gastroenterology;  Laterality: N/A;  ? ? ?Prior to Admission medications   ?Medication Sig Start Date End Date Taking? Authorizing Provider  ?alendronate (FOSAMAX) 70 MG tablet Take 70 mg by mouth every Tuesday. Take with a full glass of water on an empty stomach.    [provider]  ?amLODipine (NORVASC) 10 MG tablet Take 10 mg by mouth daily.    [provider]  ?atorvastatin (LIPITOR) 80 MG tablet Take 80 mg by mouth at bedtime.    [provider]  ?carvedilol (COREG) 12.5 MG tablet Take 12.5 mg by mouth 2 (two) times daily.    [provider]  ?cloNIDine (CATAPRES - DOSED IN MG/24 HR) 0.1 mg/24hr patch Place 0.1 mg onto the skin  every Tuesday.    [provider]  ?clopidogrel (PLAVIX) 75 MG tablet Take 75 mg by mouth daily.    [provider]  ?insulin aspart (NOVOLOG) 100 UNIT/ML injection Inject 6-10 Units into the skin See admin instructions. Inject under the skin three times daily according to sliding scale: ?150-200: 6u ?201-275: 8u ?>275: 10u    [provider]  ?insulin glargine (LANTUS) 100 UNIT/ML injection Inject 46 Units into the skin at bedtime.    [provider]  ?Lidocaine HCl (ASPERCREME W/LIDOCAINE) 4 % CREA Apply 1 application topically 3 (three) times daily as needed (pain). (apply to left calf)    [provider]  ?losartan (COZAAR) 25 MG tablet Take 25 mg by mouth daily.    [provider]  ?mirabegron ER (MYRBETRIQ) 50 MG TB24 tablet Take 50 mg by mouth daily.    [provider]  ? ? ?Family History  ?Problem Relation Age of Onset  ? Breast cancer Mother 14  ?  ? ?Social History  ? ?Tobacco Use  ? Smoking status: Never  ? Smokeless tobacco: Never  ?Substance Use Topics  ? Alcohol use: Not Currently  ? Drug use: Not Currently  ? ? ?Allergies as of 01/13/2022 - Review Complete 01/13/2022  ?Allergen Reaction Noted  ? Lisinopril Cough 10/05/2019  ? ? ?Review of Systems:    ?All systems  reviewed and negative except where noted in HPI. ? ? Physical Exam:  ?BP (!) 168/83 (Cuff Size: Large)   Pulse 80   Temp 98.9 ?F (37.2 ?C) (Oral)   Ht 5\' 1"  (1.549 m)   Wt 189 lb (85.7 kg)   BMI 35.71 kg/m?  ?No LMP recorded. Patient has had a hysterectomy. ?Psych:  Alert and cooperative. Normal Lucas and affect. ?General:   Alert,  Well-developed, well-nourished, pleasant and cooperative in NAD ?Head:  Normocephalic and atraumatic. ?Eyes:  Sclera clear, no icterus.   Conjunctiva pink. ?Ears:  Normal auditory acuity. ?Neurologic:  Alert and oriented x3; ?Psych:  Alert and cooperative. Normal Lucas and affect. ? ?Imaging Studies: ?No results found. ? ?Assessment and Plan:   ? ?Dana Lucas is a 71 y.o. y/o female has been referred for dysphagia.  Began after her stroke.  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  On Plavix. ? ?Plan ?1.  Modified barium swallow ?2.  EGD next week ?3.  We will get Plavix holding instructions ? ? ?I have discussed alternative options, risks & benefits,  which include, but are not limited to, bleeding, infection, perforation,respiratory complication & drug reaction.  The patient agrees with this plan & written consent will be obtained.   ? ? ?Follow up in 6-8 weeks ? ?Dr 62 MD,MRCP(U.K) ? ?

## 2022-01-17 ENCOUNTER — Telehealth: Payer: Self-pay

## 2022-01-17 NOTE — Telephone Encounter (Signed)
Mrs. Hedges has been notified of her Modified Barium Swallow scheduled at [name] on 01/26/22 arrival time 12:30pm.  [facility] [phone] scheduler Alvino Chapel has been notified of Mrs. Floyds appt also. ? ?Thanks, ?[name], CMA ?

## 2022-01-20 ENCOUNTER — Ambulatory Visit: Admit: 2022-01-20 | Payer: Medicare (Managed Care) | Admitting: Gastroenterology

## 2022-01-20 SURGERY — ESOPHAGOGASTRODUODENOSCOPY (EGD) WITH PROPOFOL
Anesthesia: General

## 2022-01-26 ENCOUNTER — Ambulatory Visit
Admission: RE | Admit: 2022-01-26 | Discharge: 2022-01-26 | Disposition: A | Discharge status: Home / Self Care | Payer: Medicare (Managed Care) | Source: Ambulatory Visit | Attending: Gastroenterology | Admitting: Gastroenterology

## 2022-01-26 ENCOUNTER — Telehealth: Payer: Self-pay

## 2022-01-26 DIAGNOSIS — R1310 Dysphagia, unspecified: Secondary | ICD-10-CM | POA: Diagnosis present

## 2022-01-26 DIAGNOSIS — R1312 Dysphagia, oropharyngeal phase: Secondary | ICD-10-CM | POA: Diagnosis present

## 2022-01-26 NOTE — Telephone Encounter (Signed)
Dr. Coralie Common (patient's PCP) called stating that the patient did not need to do the barium swallow since she already had a FEES on 10/27/2021 and since she was not able to have her an EGD due to not being able to hold her Plavix at this time for a procedure, she stated that she would like to cancel her barium swallow test at this time. I told her that we did not have the results of her FEES, that I would mention it it to Dr. Vicente Males and that way I could get results. She stated that she had the results of patient's FEES and that she will fax them so Dr. Vicente Males could review. Dr. Vicente Males was able to review and asked to cancel her barium swallow test. I then called the patient but had to leave her a voicemail since she did not pick up when I called her. ?

## 2022-01-26 NOTE — Therapy (Signed)
[facility] ?[address] ?[place], [place], [address] ?Phone: [phone]   Fax:    ? ?Modified Barium Swallow ? ?Patient Details  ?Name: Dana Lucas ?MRN: [id number] ?Date of Birth: December 07, 1950 ?No data recorded ? ?Encounter Date: 01/26/2022 ? ? End of Session - 01/26/22 1843   ? ? Visit Number 1   ? Number of Visits 1   ? Date for SLP Re-Evaluation 01/26/22   ? SLP Start Time 1315   ? SLP Stop Time  1357   ? SLP Time Calculation (min) 42 min   ? Activity Tolerance Patient tolerated treatment well   ? ?  ?  ? ?  ? ? ?Past Medical History:  ?Diagnosis Date  ? Diabetes mellitus without complication ([place])   ? Hypertension   ? Stroke [facility])   ? ? ?Past Surgical History:  ?Procedure Laterality Date  ? ABDOMINAL HYSTERECTOMY    ? COLONOSCOPY N/A 10/06/2019  ? Procedure: COLONOSCOPY;  Surgeon: Toledo, Benay Pike, MD;  Location: [facility] ENDOSCOPY;  Service: Gastroenterology;  Laterality: N/A;  ? ? ?There were no vitals filed for this visit. ? ? Subjective Assessment - 01/26/22 1815   ? ? Subjective Pt reports coughing with meals occasionally (solids and liquids).   ? Currently in Pain? No/denies   ? ?  ?  ? ?  ? ? ? ? 01/26/22 1800  ?SLP Visit Information  ?SLP Received On 01/26/22  ?Subjective  ?Subjective alert, cooperative, pleasant mood  ?Pain Assessment  ?Pain Assessment No/denies pain  ?General Information  ?Date of Onset 01/13/22 ?(reports onset ~5 years ago post CVA)  ?HPI Patient is a 71 y.o. female with past medical history including HTN, DM, multiple CVAs with resulting left-sided weakness, GI hemorrhage and UTI.  ?Type of Study MBS-Modified Barium Swallow Study  ?Previous Swallow Assessment reports MBS in [place] many years ago, results not available in [place]  ?Diet Prior to this Study Regular;Thin liquids  ?Temperature Spikes Noted No  ?Respiratory Status Room air  ?History of Recent Intubation No  ?Behavior/Cognition Alert;Cooperative;Pleasant mood   ?Oral Cavity Assessment WFL  ?Oral Care Completed by SLP No  ?Oral Cavity - Dentition Dentures, top;Dentures, bottom  ?Vision Functional for self feeding  ?Self-Feeding Abilities Able to feed self  ?Patient Positioning Upright in chair  ?Baseline Vocal Quality Normal  ?Volitional Cough Strong  ?Volitional Swallow Able to elicit  ?Anatomy WFL  ?Oral Motor/Sensory Function  ?Overall Oral Motor/Sensory Function Mild impairment  ?Facial ROM Reduced left;Suspected CN VII (facial) dysfunction  ?Facial Symmetry Abnormal symmetry left;Suspected CN VII (facial) dysfunction  ?Facial Strength Reduced left;Suspected CN VII (facial) dysfunction  ?Lingual ROM WFL  ?Lingual Symmetry WFL  ?Lingual Strength WFL  ?Lingual Sensation WFL  ?Velum WFL  ?Mandible WFL  ?Oral Preparation/Oral Phase  ?Oral Phase Impaired  ?Oral - Solids  ?Oral - Regular Decreased bolus cohesion;Impaired mastication  ?Pharyngeal Phase  ?Pharyngeal Phase Impaired  ?Pharyngeal - Pudding  ?Pharyngeal- Pudding Teaspoon WFL  ?Pharyngeal Material does not enter airway  ?Pharyngeal - Nectar  ?Pharyngeal- Nectar Teaspoon Delayed swallow initiation-pyriform sinuses;Penetration/Aspiration before swallow  ?Pharyngeal Material enters airway, remains ABOVE vocal cords then ejected out  ?Pharyngeal- Nectar Cup Delayed swallow initiation-pyriform sinuses;Penetration/Aspiration before swallow  ?Pharyngeal Material enters airway, remains ABOVE vocal cords then ejected out  ?Pharyngeal - Thin  ?Pharyngeal- Thin Teaspoon Delayed swallow initiation-pyriform sinuses;Penetration/Aspiration before swallow  ?Pharyngeal Material enters airway, remains ABOVE vocal cords then ejected out  ?Pharyngeal- Thin Cup Delayed  swallow initiation-pyriform sinuses;Penetration/Aspiration before swallow  ?Pharyngeal Material enters airway, remains ABOVE vocal cords then ejected out  ?Pharyngeal- Thin Straw Delayed swallow initiation-pyriform sinuses;Penetration/Aspiration before  swallow;Penetration/Aspiration during swallow;Trace aspiration  ?Pharyngeal - Solids  ?Pharyngeal- Regular WFL  ?Pharyngeal Material does not enter airway  ?Cervical Esophageal Phase  ?Cervical Esophageal Phase WFL ?(limited assessment of cervical esophagus given position of pt's shoulder in lateral projection; pt unable to stand for AP view)  ?Clinical Impression  ?Clinical Impression Patient presents with mild oropharyngeal dysphagia. Oral phase characterized by adequate bolus hold/containment and anterior to posterior transfer. Mastication is mildly prolonged with decreased bolus cohesion with solid, however grossly functional. Pharyngeal stage is noted for adequate tongue base retraction, hyolaryngeal excursion and pharyngeal constriction. Swallow initiation is delayed to the level of the pyriform sinuses with thin and nectar liquids, with intermittent shallow and largely transient penetration before the swallow due to delayed closure of the laryngeal vestibule. When drinking consecutive sips via straw, pt had one instance of deeper laryngeal penetration to the level of the vocal cords, a trace amount of which was aspirated without sensation after the swallow. Pt was able to eject this with cued cough/throat clear. There was no aspiration with cup sips, and single small cup sips prevented laryngeal penetration. Pharyngoesophageal stage of swallowing appears grossly functional, however view of pt's cervical esophagus is somewhat limited by position of pt's shoulder. Pt denies history of breathing difficulties, pneumonia or weight loss. Recommend she continue current diet with general aspiration precautions, which were reviewed with pt using video for teachback and provided in written form. Slow rate, small, single bites and sips, upright 90 degrees for all POs, NO STRAWS. Pt educated on signs of aspiration pneumonia. Overall aspiration risk is deemed mild. As pt reports difficulty with tough meats/solids,  consider esophageal assessment (EGD was cancelled due to pt being unable to hold blood thinner).  ?SLP Visit Diagnosis Dysphagia, oropharyngeal phase (R13.12)  ?Impact on safety and function Mild aspiration risk  ?Swallow Evaluation Recommendations  ?SLP Diet Recommendations Regular solids;Thin liquid  ?Liquid Administration via [employer];No straw  ?Medication Administration Whole meds with puree  ?Compensations Slow rate;Small sips/bites;Clear throat intermittently  ?Postural Changes Seated upright at 90 degrees  ?Treatment Plan  ?Oral Care Recommendations Oral care BID  ?Treatment Recommendations No treatment recommended at this time  ?Follow Up Recommendations No SLP follow up  ?Individuals Consulted  ?Consulted and Agree with Results and Recommendations Patient  ?Report Sent to  Referring physician  ?Progression Toward Goals  ?Progression toward goals Goals met, education completed, patient discharged from SLP  ?SLP Time Calculation  ?SLP Start Time (ACUTE ONLY) 1315  ?SLP Stop Time (ACUTE ONLY) 1357  ?SLP Time Calculation (min) (ACUTE ONLY) 42 min  ?SLP Evaluations  ?$ SLP Speech Visit 1 Visit  ?SLP Evaluations  ?$Outpatient MBS Swallow 1 Procedure  ? ? ? ? ? ?Dysphagia, oropharyngeal phase ? ?Dysphagia, unspecified type - Plan: DG SWALLOW FUNC OP MEDICARE SPEECH PATH, DG SWALLOW FUNC OP MEDICARE SPEECH PATH ? ? ? ? ? ? ? ?Problem List ?Patient Active Problem List  ? Diagnosis Date Noted  ? Hypertension   ? Diabetes mellitus without complication ([place])   ? Stroke [facility])   ? GIB (gastrointestinal bleeding)   ? AKI (acute kidney injury) ([place])   ? UTI (urinary tract infection)   ? ?Deneise Lever, MS, CCC-SLP ?Speech-Language Pathologist ?[phone] ? ?Aliene Altes, CCC-SLP ?01/26/2022, 6:45 PM ? ?[place] ?[place]  [address] ?[place], [place], [address] ?Phone: [phone]   Fax:    ? ?Name: Dana Lucas ?MRN: [id number] ?Date of Birth: 10/25/1950 ? ?

## 2022-01-27 ENCOUNTER — Encounter: Payer: Self-pay | Admitting: Family Medicine

## 2022-02-17 ENCOUNTER — Telehealth: Payer: Self-pay | Admitting: Gastroenterology

## 2022-02-17 NOTE — Telephone Encounter (Signed)
Pt has question about appointment on 05/23

## 2022-02-18 ENCOUNTER — Telehealth: Payer: Self-pay

## 2022-02-18 NOTE — Telephone Encounter (Signed)
Dana Lucas called patient but had to leave a detailed message telling her that the appointment on 02/22/2022 if a follow up and that since it was scheduled for MyChart, it was switched to office visit.

## 2022-02-18 NOTE — Telephone Encounter (Signed)
Returned patients call. LVM to inform her that she has an office visit scheduled with Dr. Tobi Bastos on next Tuesday at 1:45.  No prep is needed for her office visit.  Thanks, [place], [place]

## 2022-02-22 ENCOUNTER — Other Ambulatory Visit: Payer: Self-pay

## 2022-02-22 ENCOUNTER — Ambulatory Visit (INDEPENDENT_AMBULATORY_CARE_PROVIDER_SITE_OTHER): Payer: Medicare (Managed Care) | Admitting: Gastroenterology

## 2022-02-22 ENCOUNTER — Encounter: Payer: Self-pay | Admitting: Gastroenterology

## 2022-02-22 ENCOUNTER — Ambulatory Visit: Payer: Medicare (Managed Care) | Admitting: Gastroenterology

## 2022-02-22 VITALS — BP 171/76 | HR 62 | Temp 98.7°F

## 2022-02-22 DIAGNOSIS — R1310 Dysphagia, unspecified: Secondary | ICD-10-CM | POA: Diagnosis not present

## 2022-02-22 NOTE — Progress Notes (Signed)
Wyline Mood MD, MRCP(U.K) [address]  [address]  [place], [place] [address]  Main: [phone]  Fax: [phone]   Primary Care Physician: Bobbye Morton, MD  Primary Gastroenterologist:  Dr. Wyline Mood   Chief Complaint  Patient presents with   Dysphagia    HPI: Leeasia Secrist is a 71 y.o. female   Summary of history : She is here today to see me as a follow-up for dysphagia.  Seen on 01/13/2022 for the same.  Ongoing for a few months.  Affecting solids more than liquids.  At times the food goes the wrong way when she eats all began after her stroke associated with coughing.   Interval history 01/13/2022-02/22/2022  We plan to perform an EGD but it was canceled as the patient could not stop the blood thinner due to medical necessity.  We obtained a swallowing study modified barium swallow.  Mild risk of aspiration.  Recommended esophageal assessment.  Believe the patient has had another form of imaging of the esophagus and hence the barium swallow with tablet was canceled previously I still have not seen the report we are trying to obtain it from Dr. Kathleen Lime office The patient states that she has absolutely no problem swallowing at this point the issues that she had previously have resolved. Current Outpatient Medications  Medication Sig Dispense Refill   alendronate (FOSAMAX) 70 MG tablet Take 70 mg by mouth every Tuesday. Take with a full glass of water on an empty stomach.     amLODipine (NORVASC) 5 MG tablet Take 5 mg by mouth daily.     atorvastatin (LIPITOR) 80 MG tablet Take 80 mg by mouth at bedtime.     cholecalciferol (VITAMIN D) 25 MCG (1000 UNIT) tablet Take by mouth.     cloNIDine (CATAPRES - DOSED IN MG/24 HR) 0.1 mg/24hr patch Place 0.1 mg onto the skin every Tuesday.     clopidogrel (PLAVIX) 75 MG tablet Take 75 mg by mouth daily.     COREG CR 20 MG 24 hr capsule Take 20 mg by mouth daily.     DESITIN 40 % PSTE Apply topically 2 (two) times daily.      Emollient (AQUAPHOR ADVANCED THERAPY) OINT Apply topically.     famotidine (PEPCID) 20 MG tablet Take 20 mg by mouth daily.     glucose blood test strip SMARTSIG:Via Meter     insulin aspart (NOVOLOG) 100 UNIT/ML injection Inject 6-10 Units into the skin See admin instructions. Inject under the skin three times daily according to sliding scale: 150-200: 6u 201-275: 8u >275: 10u     insulin glargine (LANTUS) 100 UNIT/ML injection Inject 40 Units into the skin at bedtime.     JARDIANCE 10 MG TABS tablet Take 10 mg by mouth daily.     Lidocaine HCl 4 % CREA Apply 1 application. topically 3 (three) times daily as needed (pain). (apply to left calf)     losartan (COZAAR) 50 MG tablet Take 50 mg by mouth daily.     mirabegron ER (MYRBETRIQ) 50 MG TB24 tablet Take 50 mg by mouth daily.     TRUEplus Lancets 28G MISC Apply topically.     ULTICARE SHORT PEN NEEDLES 31G X 8 MM MISC Inject into the skin.     No current facility-administered medications for this visit.    Allergies as of 02/22/2022 - Review Complete 02/22/2022  Allergen Reaction Noted   Lisinopril Cough 10/05/2019    ROS:  General: Negative  for anorexia, weight loss, fever, chills, fatigue, weakness. ENT: Negative for hoarseness, difficulty swallowing , nasal congestion. CV: Negative for chest pain, angina, palpitations, dyspnea on exertion, peripheral edema.  Respiratory: Negative for dyspnea at rest, dyspnea on exertion, cough, sputum, wheezing.  GI: See history of present illness. GU:  Negative for dysuria, hematuria, urinary incontinence, urinary frequency, nocturnal urination.  Endo: Negative for unusual weight change.    Physical Examination:   BP (!) 171/76   Pulse 62   Temp 98.7 F (37.1 C) (Oral)   General: Well-nourished, well-developed in no acute distress.  Eyes: No icterus. Conjunctivae pink. Skin: Warm and dry, no jaundice.   Psych: Alert and cooperative, normal mood and affect.   Imaging  Studies: DG SWALLOW FUNC OP MEDICARE SPEECH PATH  Result Date: 01/26/2022 CLINICAL DATA:  Dysphagia EXAM: MODIFIED BARIUM SWALLOW TECHNIQUE: Different consistencies of barium were administered orally to the patient by the Speech Pathologist. Imaging of the pharynx was performed in the lateral projection. The APP, Pattricia Boss, pa was present in the fluoroscopy room for this study, providing personal supervision. Exam supervised by Jeronimo Greaves, M.D. FLUOROSCOPY: Radiation Exposure Index (as provided by the fluoroscopic device): 31.6 mGy Kerma COMPARISON:  None. FINDINGS: Please see speech pathology notes. IMPRESSION: Please refer to the Speech Pathologists report for complete details and recommendations. Electronically Signed   By: Jeronimo Greaves M.D.   On: 01/26/2022 14:55 Modified Barium Swallow Progress Note Patient Details Name: Logan Vegh MRN: [id number] Date of Birth: Aug 26, 1951 Today's Date: 01/26/2022 Modified Barium Swallow completed.  Full report located under Chart Review in the Imaging Section. Brief recommendations include the following: Clinical Impression  Patient presents with mild oropharyngeal dysphagia. Oral phase characterized by adequate bolus hold/containment and anterior to posterior transfer. Mastication is mildly prolonged with decreased bolus cohesion with solid, however grossly functional. Pharyngeal stage is noted for adequate tongue base retraction, hyolaryngeal excursion and pharyngeal constriction. Swallow initiation is delayed to the level of the pyriform sinuses with thin and nectar liquids, with intermittent shallow and largely transient penetration before the swallow due to delayed closure of the laryngeal vestibule. When drinking consecutive sips via straw, pt had one instance of deeper laryngeal penetration to the level of the vocal cords, a trace amount of which was aspirated without sensation after the swallow. Pt was able to eject this with cued cough/throat clear. There was  no aspiration with cup sips, and single small cup sips prevented laryngeal penetration. Pharyngoesophageal stage of swallowing appears grossly functional, however view of pt's cervical esophagus is somewhat limited by position of pt's shoulder. Pt denies history of breathing difficulties, pneumonia or weight loss. Recommend she continue current diet with general aspiration precautions, which were reviewed with pt using video for teachback and provided in written form. Slow rate, small, single bites and sips, upright 90 degrees for all POs, NO STRAWS. Pt educated on signs of aspiration pneumonia. Overall aspiration risk is deemed mild. As pt reports difficulty with tough meats/solids, consider esophageal assessment (EGD was cancelled due to pt being unable to hold blood thinner).  Swallow Evaluation Recommendations    SLP Diet Recommendations: Regular solids;Thin liquid  Liquid Administration via: Cup;No straw  Medication Administration: Whole meds with puree    Compensations: Slow rate;Small sips/bites;Clear throat intermittently  Postural Changes: Seated upright at 90 degrees  Oral Care Recommendations: Oral care BID   Rondel Baton, MS, CCC-SLP Speech-Language Pathologist [phone] Arlana Lindau 01/26/2022,6:43 PM   Assessment and Plan:   Smitty Cords  is a 71 y.o. y/o female here today to see me as a follow-up for dysphagia.  At her initial visit my concern was for oropharyngeal dysphagia i.e. transfer dysphagia the modified barium swallow did not show any significant abnormalities.  Esophageal evaluation was recommended.  We could not perform an upper endoscopy as the patient could not hold her blood thinner.  Plan was to perform an barium swallow with a tablet but we were informed that the patient already had a similar study previously.  I am trying to obtain the report.  Either way as the patient today states that she has no problems swallowing is eating meat and other foods.  I will still try and  obtain the barium study which has been performed previously to evaluate myself.    Dr Wyline MoodKiran Vickii Volland  MD,MRCP [facility](U.K) Follow up in as needed

## 2022-08-09 ENCOUNTER — Encounter: Payer: Self-pay | Admitting: Gastroenterology

## 2022-11-01 ENCOUNTER — Other Ambulatory Visit: Payer: Self-pay | Admitting: Internal Medicine

## 2022-11-01 ENCOUNTER — Encounter: Payer: Self-pay | Admitting: Internal Medicine

## 2022-11-01 DIAGNOSIS — N184 Chronic kidney disease, stage 4 (severe): Secondary | ICD-10-CM

## 2022-11-01 DIAGNOSIS — I129 Hypertensive chronic kidney disease with stage 1 through stage 4 chronic kidney disease, or unspecified chronic kidney disease: Secondary | ICD-10-CM

## 2022-11-10 ENCOUNTER — Other Ambulatory Visit: Payer: Medicare (Managed Care)

## 2022-11-10 ENCOUNTER — Ambulatory Visit
Admission: RE | Admit: 2022-11-10 | Discharge: 2022-11-10 | Disposition: A | Discharge status: Home / Self Care | Payer: Medicaid Other | Source: Ambulatory Visit | Attending: Internal Medicine | Admitting: Internal Medicine

## 2022-11-10 DIAGNOSIS — E1122 Type 2 diabetes mellitus with diabetic chronic kidney disease: Secondary | ICD-10-CM

## 2022-11-10 DIAGNOSIS — N184 Chronic kidney disease, stage 4 (severe): Secondary | ICD-10-CM

## 2022-11-29 NOTE — Therapy (Incomplete)
OUTPATIENT PHYSICAL THERAPY NEURO EVALUATION   Patient Name: Dana Lucas MRN: [id number] DOB:Jun 16, 1951, 72 y.o., female Today's Date: 11/29/2022   PCP: Gareth Morgan MD REFERRING PROVIDER: Waylan Rocher MD   END OF SESSION:   Past Medical History:  Diagnosis Date   Diabetes mellitus without complication ([place])    Hypertension    Stroke [facility])    Past Surgical History:  Procedure Laterality Date   ABDOMINAL HYSTERECTOMY     COLONOSCOPY N/A 10/06/2019   Procedure: COLONOSCOPY;  Surgeon: Toledo, Benay Pike, MD;  Location: [facility] ENDOSCOPY;  Service: Gastroenterology;  Laterality: N/A;   Patient Active Problem List   Diagnosis Date Noted   Hypertension    Diabetes mellitus without complication ([place])    Stroke ([place])    GIB (gastrointestinal bleeding)    AKI (acute kidney injury) ([place])    UTI (urinary tract infection)     ONSET DATE: ***  REFERRING DIAG: 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   THERAPY DIAG:  No diagnosis found.  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 [name] includes DM with diabetic neuropathy, stroke, HTN, AKI, UTI.   PAIN:  Are you having pain? {OPRCPAIN:27236}  PRECAUTIONS: {Therapy precautions:24002}  WEIGHT BEARING RESTRICTIONS: {Yes ***/No:24003}  FALLS: Has patient fallen in last 6 months? {fallsyesno:27318}  LIVING ENVIRONMENT: Lives with: {OPRC lives with:25569::"lives with their family"} Lives in: {Lives in:25570} Stairs: {opstairs:27293} Has following equipment at home: {Assistive  devices:23999}  PLOF: {PLOF:24004}  PATIENT GOALS: ***  OBJECTIVE:   DIAGNOSTIC FINDINGS: ***  COGNITION: Overall cognitive status: {cognition:24006}   SENSATION: {sensation:27233}  COORDINATION: ***  EDEMA:  {edema:24020}  MUSCLE TONE: {LE tone:25568}  MUSCLE LENGTH: Hamstrings: Right *** deg; Left *** deg Thomas test: Right *** deg; Left *** deg  DTRs:  {DTR SITE:24025}  POSTURE: {posture:25561}  LOWER EXTREMITY ROM:     {AROM/PROM:27142}  Right Eval Left Eval  Hip flexion    Hip extension    Hip abduction    Hip adduction    Hip internal rotation    Hip external rotation    Knee flexion    Knee extension    Ankle dorsiflexion    Ankle plantarflexion    Ankle inversion  Ankle eversion     (Blank rows = not tested)  LOWER EXTREMITY MMT:    MMT Right Eval Left Eval  Hip flexion    Hip extension    Hip abduction    Hip adduction    Hip internal rotation    Hip external rotation    Knee flexion    Knee extension    Ankle dorsiflexion    Ankle plantarflexion    Ankle inversion    Ankle eversion    (Blank rows = not tested)  BED MOBILITY:  {Bed mobility:24027}  TRANSFERS: Assistive device utilized: {Assistive devices:23999}  Sit to stand: {Levels of assistance:24026} Stand to sit: {Levels of assistance:24026} Chair to chair: {Levels of assistance:24026} Floor: {Levels of assistance:24026}  RAMP:  Level of Assistance: {Levels of assistance:24026} Assistive device utilized: {Assistive devices:23999} Ramp Comments: ***  CURB:  Level of Assistance: {Levels of assistance:24026} Assistive device utilized: {Assistive devices:23999} Curb Comments: ***  STAIRS: Level of Assistance: {Levels of assistance:24026} Stair Negotiation Technique: {Stair Technique:27161} with {Rail Assistance:27162} Number of Stairs: ***  Height of Stairs: ***  Comments: ***  GAIT: Gait pattern: {gait characteristics:25376} Distance walked:  *** Assistive device utilized: {Assistive devices:23999} Level of assistance: {Levels of assistance:24026} Comments: ***  FUNCTIONAL TESTS:  {Functional tests:24029}  PATIENT SURVEYS:  {rehab surveys:24030}  TODAY'S TREATMENT:                                                                                                                              DATE: ***    PATIENT EDUCATION: Education details: *** Person educated: {Person educated:25204} Education method: {Education Method:25205} Education comprehension: {Education Comprehension:25206}  HOME EXERCISE PROGRAM: ***  GOALS: Goals reviewed with patient? {yes/no:20286}  SHORT TERM GOALS: Target date: ***  *** Baseline: Goal status: {GOALSTATUS:25110}  2.  *** Baseline:  Goal status: {GOALSTATUS:25110}  3.  *** Baseline:  Goal status: {GOALSTATUS:25110}  4.  *** Baseline:  Goal status: {GOALSTATUS:25110}  5.  *** Baseline:  Goal status: {GOALSTATUS:25110}  6.  *** Baseline:  Goal status: {GOALSTATUS:25110}  LONG TERM GOALS: Target date: ***  *** Baseline:  Goal status: {GOALSTATUS:25110}  2.  *** Baseline:  Goal status: {GOALSTATUS:25110}  3.  *** Baseline:  Goal status: {GOALSTATUS:25110}  4.  *** Baseline:  Goal status: {GOALSTATUS:25110}  5.  *** Baseline:  Goal status: {GOALSTATUS:25110}  6.  *** Baseline:  Goal status: {GOALSTATUS:25110}  ASSESSMENT:  CLINICAL IMPRESSION: Patient is a *** y.o. *** who was seen today for physical therapy evaluation and treatment for ***.   OBJECTIVE IMPAIRMENTS: {opptimpairments:25111}.   ACTIVITY LIMITATIONS: {activitylimitations:27494}  PARTICIPATION LIMITATIONS: {participationrestrictions:25113}  PERSONAL FACTORS: {Personal factors:25162} are also affecting patient's functional outcome.   REHAB POTENTIAL: {rehabpotential:25112}  CLINICAL DECISION MAKING: {clinical decision making:25114}  EVALUATION COMPLEXITY: {Evaluation  complexity:25115}  PLAN:  PT FREQUENCY: {rehab frequency:25116}  PT DURATION: {rehab duration:25117}  PLANNED INTERVENTIONS: {rehab planned interventions:25118::"Therapeutic exercises","Therapeutic activity","Neuromuscular re-education","Balance training","Gait training","Patient/Family education","Self Care","Joint mobilization"}  PLAN FOR NEXT SESSION: ***  Janna Arch, PT 11/29/2022, 12:24 PM

## 2022-11-30 ENCOUNTER — Ambulatory Visit: Payer: 59 | Admitting: Occupational Therapy

## 2022-11-30 ENCOUNTER — Ambulatory Visit: Payer: 59

## 2022-12-05 NOTE — Therapy (Signed)
OUTPATIENT PHYSICAL THERAPY NEURO EVALUATION   Patient Name: Dana Lucas MRN: [id number] DOB:[birth year], 72 y.o., female Today's Date: 12/06/2022   PCP: Gareth Morgan MD REFERRING PROVIDER: Waylan Rocher MD   END OF SESSION:  PT End of Session - 12/06/22 1726     Visit Number 1    Number of Visits 24    Date for PT Re-Evaluation 02/28/23    Authorization Type 1/10 eval 3/5    PT Start Time 1250    PT Stop Time 1340    PT Time Calculation (min) 50 min    Equipment Utilized During Treatment Gait belt    Activity Tolerance Patient tolerated treatment well    Behavior During Therapy WFL for tasks assessed/performed             Past Medical History:  Diagnosis Date   Diabetes mellitus without complication ([place])    Hypertension    Stroke [facility])    Past Surgical History:  Procedure Laterality Date   ABDOMINAL HYSTERECTOMY     COLONOSCOPY N/A 10/06/2019   Procedure: COLONOSCOPY;  Surgeon: Toledo, Benay Pike, MD;  Location: [facility] ENDOSCOPY;  Service: Gastroenterology;  Laterality: N/A;   Patient Active Problem List   Diagnosis Date Noted   Hypertension    Diabetes mellitus without complication ([place])    Stroke ([place])    GIB (gastrointestinal bleeding)    AKI (acute kidney injury) ([place])    UTI (urinary tract infection)     ONSET DATE: 2020  REFERRING DIAG: 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   THERAPY DIAG:  Muscle weakness (generalized)  Difficulty in walking, not elsewhere classified  Rationale for Evaluation and Treatment: Rehabilitation  SUBJECTIVE:                                                                                                                                                                                             SUBJECTIVE STATEMENT: Patient presents with granddaughter. Had a stroke 4 years ago.  Pt accompanied by: family member  PERTINENT HISTORY:   Patient presents for Hemiplegia and  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 Stroke was four years ago, has had multiple strokes. Had a little bit of therapy, afterwards in [place]. Moved up to [place] about three years ago. [name] includes DM with diabetic neuropathy, stroke, HTN, AKI, UTI. Uses a walker to walk to the bathroom (two wheel and three wheel walkers).  Has a power chair for out of the house.   PAIN:  Are you having pain? Yes: NPRS scale: 4/10 Pain location: R knee pain Pain description:  aching Aggravating factors: standing, moving Relieving factors: nothing known.   PRECAUTIONS: Fall  WEIGHT BEARING RESTRICTIONS: No  FALLS: Has patient fallen in last 6 months? No  LIVING ENVIRONMENT: Lives with: lives with their family Lives in: House/apartment Stairs:  ramp outside Has following equipment at home: Gilford Rile - 2 wheeled, Environmental consultant - 4 wheeled, Wheelchair (power), shower chair, Ramped entry, and chair lift and hospital bed   PLOF: Independent  PATIENT GOALS: get up and walk by herself, wants to walk with a cane.   OBJECTIVE:   DIAGNOSTIC FINDINGS: CVA in [place]; no imaging in system  COGNITION: Overall cognitive status:  a little bit per granddaughter.    SENSATION: WFL  COORDINATION: Limited LLE     POSTURE: rounded shoulders, forward head, flexed trunk , and weight shift right    LOWER EXTREMITY MMT:    MMT Right Eval Left Eval  Hip flexion 4 2  Hip extension    Hip abduction 4 2+  Hip adduction 4 2+  Knee flexion 4 2-  Knee extension 4 2+  Ankle dorsiflexion 3   Ankle plantarflexion 3   (Blank rows = not tested)   TRANSFERS: Assistive device utilized: Environmental consultant - 2 wheeled  Sit to stand: CGA and Min A; blocking of L foot  Stand to sit: CGA Chair to chair: Min A    GAIT: Gait pattern: step to pattern, decreased stance time- Left, decreased ankle dorsiflexion- Left, and poor foot clearance- Left Distance walked: 45f Assistive device utilized: WEnvironmental consultant- 2  wheeled Level of assistance: CGA Comments: L knee hyperextension with weightbearing  FUNCTIONAL TESTS:  5 times sit to stand: 38 seconds with SUE support  10 meter walk test: 1 min 43 seconds with RW  PATIENT SURVEYS:  FOTO 46  TODAY'S TREATMENT:                                                                                                                              DATE: 12/06/22  Eval only  PATIENT EDUCATION: Education details: goals, POC, HEP  Person educated: Patient and granddaughter Education method: Explanation, DMedia planner TCorporate treasurercues, Verbal cues, and Handouts Education comprehension: verbalized understanding, returned demonstration, verbal cues required, tactile cues required, and needs further education  HOME EXERCISE PROGRAM: Access Code: [email or web address]: [email or web address] Date: 12/06/2022 Prepared by: MJanna Arch Exercises - Leg Extension  - 1 x daily - 7 x weekly - 2 sets - 10 reps - 5 hold - Seated March  - 1 x daily - 7 x weekly - 2 sets - 10 reps - 5 hold - Seated Hip Abduction  - 1 x daily - 7 x weekly - 2 sets - 10 reps - 5 hold - Seated Hip Adduction Isometrics with Ball  - 1 x daily - 7 x weekly - 2 sets - 10 reps - 5 hold - Seated Weight Shifting Without Arm Support  - 1 x daily - 7 x weekly -  2 sets - 10 reps - 5 hold   GOALS: Goals reviewed with patient? Yes  SHORT TERM GOALS: Target date: 01/03/2023    Patient will be independent in home exercise program to improve strength/mobility for better functional independence with ADLs. Baseline: Goal status: INITIAL    LONG TERM GOALS: Target date: 02/28/2023    Patient will increase FOTO score to equal to or greater than 53    to demonstrate statistically significant improvement in mobility and quality of life.  Baseline: 3/5: 46% Goal status: INITIAL  2.  Patient (> 39 years old) will complete five times sit to stand test in < 15 seconds indicating an increased LE  strength and improved balance. Baseline: 3/5: 38 seconds with SUE support; blocking of LLE Goal status: INITIAL  3.  Patient will increase 10 meter walk test to >1.28ms as to improve gait speed for better community ambulation and to reduce fall risk Baseline: 3/5: 1 min 43 seconds with RW and w/c follow Goal status: INITIAL  4.  Patient will increase BLE gross strength to 4/5 as to improve functional strength for independent gait, increased standing tolerance and increased ADL ability. Baseline: 3/5: grossly 2/5 LLE  Goal status: INITIAL    ASSESSMENT:  CLINICAL IMPRESSION: Patient is a 72y.o. female who was seen today for physical therapy evaluation and treatment for LLE weakness s/p CVA. Patient presents with granddaughter in a power chair. She reports using a walker at home for short ambulation to the restroom. Patient does not have full active ROM due to weakness of affected limb. She requires blocking of affected limb for sit to stand test.  Patient educated on HEP with demonstration of understanding. Patient will benefit from skilled physical therapy to improve function, mobility, and strength.  OBJECTIVE IMPAIRMENTS: Abnormal gait, cardiopulmonary status limiting activity, decreased activity tolerance, decreased balance, decreased coordination, decreased endurance, decreased knowledge of use of DME, decreased mobility, difficulty walking, decreased ROM, decreased strength, impaired perceived functional ability, impaired flexibility, impaired UE functional use, improper body mechanics, postural dysfunction, obesity, and pain.   ACTIVITY LIMITATIONS: carrying, lifting, bending, sitting, standing, squatting, sleeping, stairs, transfers, bed mobility, bathing, toileting, dressing, reach over head, and caring for others  PARTICIPATION LIMITATIONS: meal prep, cleaning, laundry, interpersonal relationship, driving, shopping, community activity, and church  PERSONAL FACTORS: Age, Behavior  pattern, Education, Fitness, Past/current experiences, Profession, Sex, Time since onset of injury/illness/exacerbation, and 3+ comorbidities: DM with diabetic neuropathy, stroke, HTN, AKI, UTI  are also affecting patient's functional outcome.   REHAB POTENTIAL: Fair length of time since CVA  CLINICAL DECISION MAKING: Evolving/moderate complexity  EVALUATION COMPLEXITY: Moderate  PLAN:  PT FREQUENCY: 2x/week  PT DURATION: 12 weeks  PLANNED INTERVENTIONS: Therapeutic exercises, Therapeutic activity, Neuromuscular re-education, Balance training, Gait training, Patient/Family education, Self Care, Joint mobilization, Stair training, Vestibular training, Canalith repositioning, Visual/preceptual remediation/compensation, Orthotic/Fit training, DME instructions, Cognitive remediation, Electrical stimulation, Wheelchair mobility training, Spinal mobilization, Cryotherapy, Moist heat, Splintting, Taping, Traction, Ultrasound, Manual therapy, and Re-evaluation  PLAN FOR NEXT SESSION: stand in // bars, work on walking, strengthening LLE.   MJanna Arch PT 12/06/2022, 5:27 PM

## 2022-12-06 ENCOUNTER — Ambulatory Visit: Payer: 59 | Attending: Internal Medicine

## 2022-12-06 DIAGNOSIS — R278 Other lack of coordination: Secondary | ICD-10-CM | POA: Diagnosis present

## 2022-12-06 DIAGNOSIS — R2681 Unsteadiness on feet: Secondary | ICD-10-CM | POA: Diagnosis present

## 2022-12-06 DIAGNOSIS — R262 Difficulty in walking, not elsewhere classified: Secondary | ICD-10-CM | POA: Insufficient documentation

## 2022-12-06 DIAGNOSIS — M6281 Muscle weakness (generalized): Secondary | ICD-10-CM | POA: Insufficient documentation

## 2022-12-08 ENCOUNTER — Ambulatory Visit: Payer: 59 | Admitting: Occupational Therapy

## 2022-12-08 ENCOUNTER — Ambulatory Visit: Payer: 59

## 2022-12-13 ENCOUNTER — Ambulatory Visit: Payer: 59

## 2022-12-13 ENCOUNTER — Ambulatory Visit: Payer: 59 | Admitting: Occupational Therapy

## 2022-12-13 DIAGNOSIS — M6281 Muscle weakness (generalized): Secondary | ICD-10-CM | POA: Diagnosis not present

## 2022-12-13 DIAGNOSIS — R262 Difficulty in walking, not elsewhere classified: Secondary | ICD-10-CM

## 2022-12-13 DIAGNOSIS — R2681 Unsteadiness on feet: Secondary | ICD-10-CM

## 2022-12-13 DIAGNOSIS — R278 Other lack of coordination: Secondary | ICD-10-CM

## 2022-12-13 NOTE — Therapy (Signed)
OUTPATIENT OCCUPATIONAL THERAPY NEURO EVALUATION  Patient Name: Dana Lucas MRN: [id number] DOB:Dec 21, 1950, 72 y.o., female Today's Date: 12/13/2022  PCP: Durenda Guthrie, MD REFERRING PROVIDER: Farrel Gordon, MD  END OF SESSION:  OT End of Session - 12/13/22 1136     Visit Number 1    Number of Visits 24    Date for OT Re-Evaluation 03/07/23    Authorization Type Progress report period starting 12/13/2022    OT Start Time 1015    OT Stop Time 1100    OT Time Calculation (min) 45 min    Activity Tolerance Patient tolerated treatment well    Behavior During Therapy [facility] for tasks assessed/performed             Past Medical History:  Diagnosis Date   Diabetes mellitus without complication ([place])    Hypertension    Stroke [facility]    Past Surgical History:  Procedure Laterality Date   ABDOMINAL HYSTERECTOMY     COLONOSCOPY N/A 10/06/2019   Procedure: COLONOSCOPY;  Surgeon: Toledo, Benay Pike, MD;  Location: [facility] ENDOSCOPY;  Service: Gastroenterology;  Laterality: N/A;   Patient Active Problem List   Diagnosis Date Noted   Hypertension    Diabetes mellitus without complication ([place])    Stroke ([place])    GIB (gastrointestinal bleeding)    AKI (acute kidney injury) ([place])    UTI (urinary tract infection)     ONSET DATE: 05/2014  REFERRING DIAG: CVA  THERAPY DIAG:  Muscle weakness (generalized)  Rationale for Evaluation and Treatment: Rehabilitation  SUBJECTIVE:   SUBJECTIVE STATEMENT: Pt. presented with the initial evaluation following PT this morning. Pt accompanied by: family member grand daughter  PERTINENT HISTORY: Pt. is a 72 y.o. female who has a history of multiple CVAs beginning in 65. Pt. 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   PRECAUTIONS: None  WEIGHT BEARING RESTRICTIONS: No  PAIN:  Are you having pain? No  FALLS: Has patient fallen in last 6 months? No  LIVING ENVIRONMENT: Lives with: lives with their  family and lives with their son Lives in: House/apartment Stairs: one story, ramped entrance Has following equipment at home: Wheelchair (power) and Wheelchair (manual), 2 wheeled walker, 3 wheeled walker, power lift, hospital bed, transfer shower bench  PLOF: Independent  PATIENT GOALS: To be able to walk again, and to drive  OBJECTIVE:   HAND DOMINANCE: Right  ADLs:  Transfers/ambulation related to ADLs: Eating: Independent Grooming: Independent UB Dressing: Independent LB Dressing: Independent pants, MaxA shoes, and socks Toileting: Assist with transfers, reports independence with toileting care Bathing: Assist from DIL Tub Shower transfers: Min-ModA shower transfers Equipment: See above  IADLs: Shopping: DIL performs shopping Light housekeeping: Unable Meal Prep: Pt. Is able to retrieve a beverage, or snack, light meal prep Community mobility: Power w/c Medication management: Grand daughter assists with pillbox set-up.  Pt. Independently takes medication Financial management: No changes Handwriting: Name: 100% legible in printed form, 75% in cursive form  MOBILITY STATUS: Needs Assist: Uses power w/c  POSTURE COMMENTS:  Sitting balance: WFL supported   FUNCTIONAL OUTCOME MEASURES: FOTO: 33 TR:39  UPPER EXTREMITY ROM:    Active ROM Right WFL Left eval  Shoulder flexion  0(93)  Shoulder abduction  30(83)  Shoulder adduction    Shoulder extension    Shoulder internal rotation    Shoulder external rotation    Elbow flexion    Elbow extension  0-87(0-122)  Wrist flexion  52  Wrist extension  10(50)  Wrist  ulnar deviation    Wrist radial deviation    Wrist pronation    Wrist supination    (Blank rows = not tested)  Full Digit flexion to the [facility] Active digit extension through 75% of the range grossly Active thumb radial, and palmar abduction  UPPER EXTREMITY MMT:     MMT Right eval Left eval  Shoulder flexion  0/5  Shoulder abduction  2-/5   Shoulder adduction    Shoulder extension    Shoulder internal rotation    Shoulder external rotation    Middle trapezius    Lower trapezius    Elbow flexion  3/5  Elbow extension    Wrist flexion    Wrist extension  2/5  Wrist ulnar deviation    Wrist radial deviation    Wrist pronation    Wrist supination    (Blank rows = not tested)  HAND FUNCTION: Grip strength: Right: 18 lbs; Left: 10 lbs, Lateral pinch: Right: 5 lbs, Left: 11 lbs, and 3 point pinch: Right: 1 lbs, Left: 15 lbs  COORDINATION: Eval: N/A  SENSATION: WFL  EDEMA: TBD  MUSCLE TONE: LUE: Hypotonic  COGNITION: Overall cognitive status: Within functional limits for tasks assessed  VISION: Subjective report:  Pt. Reports having an Eye appointment Baseline vision: Wears glasses all the time  VISION ASSESSMENT: To be further assessed in functional context  PRAXIS: Impaired: Motor planning   TODAY'S TREATMENT:                                                                                                                              DATE: 12/13/2022   PATIENT EDUCATION: Education details: OT services, POC, and goals Person educated: Patient Education method: Explanation, Demonstration, and Verbal cues Education comprehension: needs further education  HOME EXERCISE PROGRAM:  Continue to assess ongoing need for HEP.   GOALS: Goals reviewed with patient? Yes  SHORT TERM GOALS: Target date: 01/24/2023   Pt. Will demonstrate independence with HEPs for the LUE. Baseline: Eval: No current HEP Goal status: INITIAL  LONG TERM GOALS: Target date: 03/07/2023  Pt. Will increase FOTO score by 2 points for Pt. perceived improvement with assessment specific ADL/IADL improvement. Baseline: Eval: FOTO 33, TR score: 39 Goal status: INITIAL  2.  Pt. Will improve right shoulder ROM by 10 degrees to assist with self-dressing. Baseline: Left shoulder flexion: 0(93), Abduction: 30(83) Goal status: INITIAL  3.   Pt. Will improve left wrist extension by 10 in preparation for functional reaching Baseline: Left wrist extension: 10(50) Goal status: INITIAL  4.  Pt. Will left grip strength by 5# in preparation for being able to securely stabilize ADL/IADL items at the tabletop. Baseline: Left grip strength: 10# Goal status: INITIAL  ASSESSMENT:  CLINICAL IMPRESSION:  Patient is a 72 y.o. female who was seen today for occupational therapy evaluation for CVA with Left sided hemiplegia. Pt. presents with active left scapular elevation, no active shoulder flexion, limited active abduction,  and limited active wrist extension. Pt. presents with limited strength throughout the LUE, which limits her ability to engage the LUE during daily ADL, and IADL tasks. Pt.'s FOTO score is 33 with TR score 39. Pt. will benefit from OT services to work on improving LUE functioning in order to maximize independence with ADLs, and IADL tasks.   PERFORMANCE DEFICITS: in functional skills including ADLs, IADLs, coordination, dexterity, ROM, strength, pain, Fine motor control, and Gross motor control, cognitive skills including attention, problem solving, and safety awareness, and psychosocial skills including coping strategies, environmental adaptation, and routines and behaviors.   IMPAIRMENTS: are limiting patient from ADLs, IADLs, leisure, and social participation.   CO-MORBIDITIES: may have co-morbidities  that affects occupational performance. Patient will benefit from skilled OT to address above impairments and improve overall function.  MODIFICATION OR ASSISTANCE TO COMPLETE EVALUATION: Maximum or significant modification of tasks or assist is necessary to complete an evaluation.  OT OCCUPATIONAL PROFILE AND HISTORY: Comprehensive assessment: Review of records and extensive additional review of physical, cognitive, psychosocial history related to current functional performance.  CLINICAL DECISION MAKING: High - multiple  treatment options, significant modification of task necessary  REHAB POTENTIAL: Good  EVALUATION COMPLEXITY: High    PLAN:  OT FREQUENCY: 2x/week  OT DURATION: 12 weeks  PLANNED INTERVENTIONS: self care/ADL training, therapeutic exercise, therapeutic activity, neuromuscular re-education, manual therapy, passive range of motion, functional mobility training, electrical stimulation, and paraffin  RECOMMENDED OTHER SERVICES: OT  CONSULTED AND AGREED WITH PLAN OF CARE: Patient  PLAN FOR NEXT SESSION: Initiate Treatment  Harrel Carina, MS, OTR/L   Harrel Carina, OT 12/13/2022, 1:26 PM

## 2022-12-13 NOTE — Therapy (Signed)
OUTPATIENT PHYSICAL THERAPY NEURO TREATMENT NOTE   Patient Name: Dana Lucas MRN: [id number] DOB:1951/03/05, 72 y.o., female Today's Date: 12/13/2022   PCP: Gareth Morgan MD REFERRING PROVIDER: Waylan Rocher MD   END OF SESSION:  PT End of Session - 12/13/22 1348     Visit Number 2    Number of Visits 24    Date for PT Re-Evaluation 02/28/23    Authorization Type 1/10 eval 3/5    PT Start Time 0932    PT Stop Time 1014    PT Time Calculation (min) 42 min    Equipment Utilized During Treatment Gait belt    Activity Tolerance Patient tolerated treatment well    Behavior During Therapy WFL for tasks assessed/performed              Past Medical History:  Diagnosis Date   Diabetes mellitus without complication ([place])    Hypertension    Stroke [facility])    Past Surgical History:  Procedure Laterality Date   ABDOMINAL HYSTERECTOMY     COLONOSCOPY N/A 10/06/2019   Procedure: COLONOSCOPY;  Surgeon: Toledo, Benay Pike, MD;  Location: [facility] ENDOSCOPY;  Service: Gastroenterology;  Laterality: N/A;   Patient Active Problem List   Diagnosis Date Noted   Hypertension    Diabetes mellitus without complication ([place])    Stroke ([place])    GIB (gastrointestinal bleeding)    AKI (acute kidney injury) ([place])    UTI (urinary tract infection)     ONSET DATE: 2020  REFERRING DIAG: 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   THERAPY DIAG:  Muscle weakness (generalized)  Other lack of coordination  Unsteadiness on feet  Difficulty in walking, not elsewhere classified  Rationale for Evaluation and Treatment: Rehabilitation  SUBJECTIVE:                                                                                                                                                                                             SUBJECTIVE STATEMENT: Patient presents to session accompanied by granddaughter. Pt reports chronic pain in R knee. Pt  reports she tried her HEP but states, "it didn't go to well," due to difficulty moving affected LE. 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 Stroke was four years ago, has had multiple strokes. Had a little bit of therapy, afterwards in [place]. Moved up to [place] about three years ago. [name] includes DM with diabetic neuropathy, stroke, HTN, AKI, UTI. Uses a walker to walk  to the bathroom (two wheel and three wheel walkers).  Has a power chair for out of the house.   PAIN:  Are you having pain? Yes: NPRS scale: 4/10 Pain location: R knee pain Pain description: aching Aggravating factors: standing, moving Relieving factors: nothing known.   PRECAUTIONS: Fall  WEIGHT BEARING RESTRICTIONS: No  FALLS: Has patient fallen in last 6 months? No  LIVING ENVIRONMENT: Lives with: lives with their family Lives in: House/apartment Stairs:  ramp outside Has following equipment at home: Gilford Rile - 2 wheeled, Environmental consultant - 4 wheeled, Wheelchair (power), shower chair, Ramped entry, and chair lift and hospital bed   PLOF: Independent  PATIENT GOALS: get up and walk by herself, wants to walk with a cane.   OBJECTIVE:   DIAGNOSTIC FINDINGS: CVA in [place]; no imaging in system  COGNITION: Overall cognitive status:  a little bit per granddaughter.    SENSATION: WFL  COORDINATION: Limited LLE     POSTURE: rounded shoulders, forward head, flexed trunk , and weight shift right    LOWER EXTREMITY MMT:    MMT Right Eval Left Eval  Hip flexion 4 2  Hip extension    Hip abduction 4 2+  Hip adduction 4 2+  Knee flexion 4 2-  Knee extension 4 2+  Ankle dorsiflexion 3   Ankle plantarflexion 3   (Blank rows = not tested)   TRANSFERS: Assistive device utilized: Environmental consultant - 2 wheeled  Sit to stand: CGA and Min A; blocking of L foot  Stand to sit:  CGA Chair to chair: Min A    GAIT: Gait pattern: step to pattern, decreased stance time- Left, decreased ankle dorsiflexion- Left, and poor foot clearance- Left Distance walked: 89f Assistive device utilized: WEnvironmental consultant- 2 wheeled Level of assistance: CGA Comments: L knee hyperextension with weightbearing  FUNCTIONAL TESTS:  5 times sit to stand: 38 seconds with SUE support  10 meter walk test: 1 min 43 seconds with RW  PATIENT SURVEYS:  FOTO 46  TODAY'S TREATMENT:                                                                                                                              DATE: 12/13/22  Seated: LAQ 2x10 each LE with TC/ min assist LLE  LAQ with 2# AW on RLE  1x10 Seated march RLE 10x, then with 2# AW 1x15, rates medium. LLE 3x10 no AW, rates hard, pt provides TC.  STS 3x5 cuing for technique. PT blocking RLE, push through UUE   Weight shifts at RW LTL x multiple reps with 2 reps of 10 sec holds LLE  Weight shifts at RW FWD/BCKWD with fwd/bckwd step RLE x multiple reps  Gait with RW [id number] turn to chair, close CGA, block to decrease L knee hyperext.  Seated hip adduction with p ball 10x 3-5 sec holds. Rates easy   PATIENT EDUCATION: Education details: Pt educated throughout session about proper posture and technique  with exercises. Improved exercise technique, movement at target joints, use of target muscles after min to mod verbal, visual, tactile cues.  Person educated: Patient and granddaughter Education method: Explanation, Demonstration, Tactile cues, and Verbal cues Education comprehension: verbalized understanding, returned demonstration, verbal cues required, tactile cues required, and needs further education  HOME EXERCISE PROGRAM: Access Code: [email or web address] URL: [email or web address] Date: 12/06/2022 Prepared by: Janna Arch  Exercises - Leg Extension  - 1 x daily - 7 x weekly - 2 sets - 10 reps - 5 hold - Seated March  - 1 x  daily - 7 x weekly - 2 sets - 10 reps - 5 hold - Seated Hip Abduction  - 1 x daily - 7 x weekly - 2 sets - 10 reps - 5 hold - Seated Hip Adduction Isometrics with Ball  - 1 x daily - 7 x weekly - 2 sets - 10 reps - 5 hold - Seated Weight Shifting Without Arm Support  - 1 x daily - 7 x weekly - 2 sets - 10 reps - 5 hold   GOALS: Goals reviewed with patient? Yes  SHORT TERM GOALS: Target date: 01/03/2023    Patient will be independent in home exercise program to improve strength/mobility for better functional independence with ADLs. Baseline: Goal status: INITIAL    LONG TERM GOALS: Target date: 02/28/2023    Patient will increase FOTO score to equal to or greater than 53    to demonstrate statistically significant improvement in mobility and quality of life.  Baseline: 3/5: 46% Goal status: INITIAL  2.  Patient (> 33 years old) will complete five times sit to stand test in < 15 seconds indicating an increased LE strength and improved balance. Baseline: 3/5: 38 seconds with SUE support; blocking of LLE Goal status: INITIAL  3.  Patient will increase 10 meter walk test to >1.38ms as to improve gait speed for better community ambulation and to reduce fall risk Baseline: 3/5: 1 min 43 seconds with RW and w/c follow Goal status: INITIAL  4.  Patient will increase BLE gross strength to 4/5 as to improve functional strength for independent gait, increased standing tolerance and increased ADL ability. Baseline: 3/5: grossly 2/5 LLE  Goal status: INITIAL    ASSESSMENT:  CLINICAL IMPRESSION: Initiated LE strengthening and focus on standing weight-shift interventions. Pt required hands-on assist and frequent TC for improved activation of LLE musculature with interventions. PT continued to provide block to L knee as well with standing exercises. PT did review part of HEP today to improve carryover. The patient will benefit from skilled physical therapy to improve function, mobility, and  strength.  OBJECTIVE IMPAIRMENTS: Abnormal gait, cardiopulmonary status limiting activity, decreased activity tolerance, decreased balance, decreased coordination, decreased endurance, decreased knowledge of use of DME, decreased mobility, difficulty walking, decreased ROM, decreased strength, impaired perceived functional ability, impaired flexibility, impaired UE functional use, improper body mechanics, postural dysfunction, obesity, and pain.   ACTIVITY LIMITATIONS: carrying, lifting, bending, sitting, standing, squatting, sleeping, stairs, transfers, bed mobility, bathing, toileting, dressing, reach over head, and caring for others  PARTICIPATION LIMITATIONS: meal prep, cleaning, laundry, interpersonal relationship, driving, shopping, community activity, and church  PERSONAL FACTORS: Age, Behavior pattern, Education, Fitness, Past/current experiences, Profession, Sex, Time since onset of injury/illness/exacerbation, and 3+ comorbidities: DM with diabetic neuropathy, stroke, HTN, AKI, UTI  are also affecting patient's functional outcome.   REHAB POTENTIAL: Fair length of time since CVA  CLINICAL DECISION MAKING: Evolving/moderate complexity  EVALUATION COMPLEXITY: Moderate  PLAN:  PT FREQUENCY: 2x/week  PT DURATION: 12 weeks  PLANNED INTERVENTIONS: Therapeutic exercises, Therapeutic activity, Neuromuscular re-education, Balance training, Gait training, Patient/Family education, Self Care, Joint mobilization, Stair training, Vestibular training, Canalith repositioning, Visual/preceptual remediation/compensation, Orthotic/Fit training, DME instructions, Cognitive remediation, Electrical stimulation, Wheelchair mobility training, Spinal mobilization, Cryotherapy, Moist heat, Splintting, Taping, Traction, Ultrasound, Manual therapy, and Re-evaluation  PLAN FOR NEXT SESSION: stand in // bars, work on walking, strengthening LLE.  Zollie Pee, PT 12/13/2022, 2:09 PM

## 2022-12-16 ENCOUNTER — Ambulatory Visit: Payer: 59

## 2022-12-16 DIAGNOSIS — M6281 Muscle weakness (generalized): Secondary | ICD-10-CM

## 2022-12-16 DIAGNOSIS — R278 Other lack of coordination: Secondary | ICD-10-CM

## 2022-12-16 NOTE — Therapy (Signed)
OUTPATIENT OCCUPATIONAL THERAPY NEURO TREATMENT NOTE  Patient Name: Dana Lucas MRN: [id number] DOB:Apr 24, 1951, 72 y.o., female Today's Date: 12/18/2022  PCP: Hinda Lenis, MD REFERRING PROVIDER: Sumner Boast, MD  END OF SESSION:  OT End of Session - 12/18/22 1530     Visit Number 2    Number of Visits 24    Date for OT Re-Evaluation 03/07/23    Authorization Type Progress report period starting 12/13/2022    Progress Note Due on Visit 10    OT Start Time 1100    OT Stop Time 1145    OT Time Calculation (min) 45 min    Activity Tolerance Patient tolerated treatment well    Behavior During Therapy [facility] for tasks assessed/performed             Past Medical History:  Diagnosis Date   Diabetes mellitus without complication ([facility])    Hypertension    Stroke [facility])    Past Surgical History:  Procedure Laterality Date   ABDOMINAL HYSTERECTOMY     COLONOSCOPY N/A 10/06/2019   Procedure: COLONOSCOPY;  Surgeon: Toledo, Boykin Nearing, MD;  Location: [facility] ENDOSCOPY;  Service: Gastroenterology;  Laterality: N/A;   Patient Active Problem List   Diagnosis Date Noted   Hypertension    Diabetes mellitus without complication ([facility])    Stroke ([facility])    GIB (gastrointestinal bleeding)    AKI (acute kidney injury) ([facility])    UTI (urinary tract infection)     ONSET DATE: 05/2014  REFERRING DIAG: CVA  THERAPY DIAG:  Muscle weakness (generalized)  Other lack of coordination  Rationale for Evaluation and Treatment: Rehabilitation  SUBJECTIVE:   SUBJECTIVE STATEMENT: Pt reported that she would really like to be more indep with her bathing and the transfer onto her tub bench. Pt accompanied by: family member grand daughter  PERTINENT HISTORY: Pt. is a 72 y.o. female who has a history of multiple CVAs beginning in 25. Pt. 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   PRECAUTIONS: None  WEIGHT BEARING RESTRICTIONS: No  PAIN:  Are you having pain?  No  FALLS: Has patient fallen in last 6 months? No  LIVING ENVIRONMENT: Lives with: lives with their family and lives with their son Lives in: House/apartment Stairs: one story, ramped entrance Has following equipment at home: Wheelchair (power) and Wheelchair (manual), 2 wheeled walker, 3 wheeled walker, power lift, hospital bed, transfer shower bench  PLOF: Independent  PATIENT GOALS: To be able to walk again, and to drive  OBJECTIVE:   HAND DOMINANCE: Right  ADLs:  Transfers/ambulation related to ADLs: Eating: Independent Grooming: Independent UB Dressing: Independent LB Dressing: Independent pants, MaxA shoes, and socks Toileting: Assist with transfers, reports independence with toileting care Bathing: Assist from DIL Tub Shower transfers: Min-ModA shower transfers Equipment: See above  IADLs: Shopping: DIL performs shopping Light housekeeping: Unable Meal Prep: Pt. Is able to retrieve a beverage, or snack, light meal prep Community mobility: Power w/c Medication management: Grand daughter assists with pillbox set-up.  Pt. Independently takes medication Financial management: No changes Handwriting: Name: 100% legible in printed form, 75% in cursive form  MOBILITY STATUS: Needs Assist: Uses power w/c  POSTURE COMMENTS:  Sitting balance: WFL supported   FUNCTIONAL OUTCOME MEASURES: FOTO: 33 TR:39  UPPER EXTREMITY ROM:    Active ROM Right WFL Left eval  Shoulder flexion  0(93)  Shoulder abduction  30(83)  Shoulder adduction    Shoulder extension    Shoulder internal rotation    Shoulder  external rotation    Elbow flexion    Elbow extension  0-87(0-122)  Wrist flexion  52  Wrist extension  10(50)  Wrist ulnar deviation    Wrist radial deviation    Wrist pronation    Wrist supination    (Blank rows = not tested)  Full Digit flexion to the [facility] Active digit extension through 75% of the range grossly Active thumb radial, and palmar abduction  UPPER  EXTREMITY MMT:     MMT Right eval Left eval  Shoulder flexion  0/5  Shoulder abduction  2-/5  Shoulder adduction    Shoulder extension    Shoulder internal rotation    Shoulder external rotation    Middle trapezius    Lower trapezius    Elbow flexion  3/5  Elbow extension    Wrist flexion    Wrist extension  2/5  Wrist ulnar deviation    Wrist radial deviation    Wrist pronation    Wrist supination    (Blank rows = not tested)  HAND FUNCTION: Grip strength: Right: 18 lbs; Left: 10 lbs, Lateral pinch: Right: 5 lbs, Left: 11 lbs, and 3 point pinch: Right: 1 lbs, Left: 15 lbs  COORDINATION: Eval: N/A  SENSATION: WFL  EDEMA: TBD  MUSCLE TONE: LUE: Hypotonic  COGNITION: Overall cognitive status: Within functional limits for tasks assessed  VISION: Subjective report:  Pt. Reports having an Eye appointment Baseline vision: Wears glasses all the time  VISION ASSESSMENT: To be further assessed in functional context  PRAXIS: Impaired: Motor planning   TODAY'S TREATMENT:   Therapeutic Exercise:  Instructed pt in and completed towel squeezes for bilat grip strengthening; 3 sets 10 reps each hand.  Encouraged completion at home daily for initial HEP.  Performed AAROM gravity eliminated for L wrist extension, working to increase strength for functional reaching goal; completed 3 sets 10 reps each.  Performed reps of LUE AAROM for combination movement of L horiz add and elbow flexion to simulate LUE reaching toward top of arm for bathing simulation; 3 sets 10 reps each.  Self Care: Pt reports she uses a tub bench but has difficulty lifting her legs over the tub without assist from caregivers.  Instructed pt in seated marches and knee extension exercises x3 sets 10 reps each for increasing strength to lift legs over the tub.  Simulated scooting on tub bench with scooting reps up/down mat table; min guard and cues for technique.  Pt practiced using leg lifter to lift LLE  (hemiparetic side) over a cone (though lower than tub ledge).  Pt reports she does have a leg lifter at home and sometimes uses it to help with her tub transfer, but she stated it has been about 2 weeks since she tried this.  OT encouraged pt start to practice use of leg lifter consistently and will continue to improve technique to maximize indep during OT sessions.  Pt agreed.  Placed washcloth in L hand and assisted pt with RUE bathing simulation.  Sit<>stand from wc min A with RW, and min guard for step pivot to edge of mat table.                PATIENT EDUCATION: Education details: initiated HEP, use of leg lifter, scooting technique on mat to simulate tub bench scooting  Person educated: Patient Education method: Explanation, Demonstration, and Verbal cues Education comprehension: needs further education  HOME EXERCISE PROGRAM: Towel squeezes grip strengthening, seated marches and knee extension (lifting legs over tub)  GOALS: Goals reviewed with patient? Yes  SHORT TERM GOALS: Target date: 01/24/2023   Pt. Will demonstrate independence with HEPs for the LUE. Baseline: Eval: No current HEP Goal status: INITIAL  LONG TERM GOALS: Target date: 03/07/2023  Pt. Will increase FOTO score by 2 points for Pt. perceived improvement with assessment specific ADL/IADL improvement. Baseline: Eval: FOTO 33, TR score: 39 Goal status: INITIAL  2.  Pt. Will improve right shoulder ROM by 10 degrees to assist with self-dressing. Baseline: Left shoulder flexion: 0(93), Abduction: 30(83) Goal status: INITIAL  3.  Pt. Will improve left wrist extension by 10 in preparation for functional reaching Baseline: Left wrist extension: 10(50) Goal status: INITIAL  4.  Pt. Will left grip strength by 5# in preparation for being able to securely stabilize ADL/IADL items at the tabletop. Baseline: Left grip strength:  10# Goal status: INITIAL  5. Pt will perform tub transfer with leg lifter and transfer tub bench with supv/set up.  Baseline: Min A with sit<>stand from tub bench, mod A to lift L leg over tub.  Goal status: NEW  6. Pt will perform UB bathing with min A.  Baseline: Mod A  Goal status: New  ASSESSMENT:  CLINICAL IMPRESSION: Initiated HEP as noted above to increase strength for UB/LB ADLs.  Pt tolerated all therapeutic exercises well.  Practiced simulation of UB bathing, lifting legs over a threshold to simulate tub transfers, and use of leg lifter for lifting legs over tub.  Pt has leg lifter at home which she sometimes uses for the tub, but states it's been a couple weeks since she has tried this.  2 new goals added, including UB bathing and tub transfers, as pt states that she would really like to be more indep with both.   Pt will continue to benefit from OT services to work on improving LUE functioning in order to maximize independence with ADLs, and IADL tasks.   PERFORMANCE DEFICITS: in functional skills including ADLs, IADLs, coordination, dexterity, ROM, strength, pain, Fine motor control, and Gross motor control, cognitive skills including attention, problem solving, and safety awareness, and psychosocial skills including coping strategies, environmental adaptation, and routines and behaviors.   IMPAIRMENTS: are limiting patient from ADLs, IADLs, leisure, and social participation.   CO-MORBIDITIES: may have co-morbidities  that affects occupational performance. Patient will benefit from skilled OT to address above impairments and improve overall function.  MODIFICATION OR ASSISTANCE TO COMPLETE EVALUATION: Maximum or significant modification of tasks or assist is necessary to complete an evaluation.  OT OCCUPATIONAL PROFILE AND HISTORY: Comprehensive assessment: Review of records and extensive additional review of physical, cognitive, psychosocial history related to current functional  performance.  CLINICAL DECISION MAKING: High - multiple treatment options, significant modification of task necessary  REHAB POTENTIAL: Good  EVALUATION COMPLEXITY: High    PLAN:  OT FREQUENCY: 2x/week  OT DURATION: 12 weeks  PLANNED INTERVENTIONS: self care/ADL training, therapeutic exercise, therapeutic activity, neuromuscular re-education, manual therapy, passive range of motion, functional mobility training,  electrical stimulation, and paraffin  RECOMMENDED OTHER SERVICES: OT  CONSULTED AND AGREED WITH PLAN OF CARE: Patient  PLAN FOR NEXT SESSION: therapeutic exercises, ADL/AE training   Danelle Earthly, MS, OTR/L  Otis Dials, OT 12/18/2022, 3:31 PM

## 2022-12-20 ENCOUNTER — Ambulatory Visit: Payer: 59 | Admitting: Occupational Therapy

## 2022-12-20 ENCOUNTER — Ambulatory Visit: Payer: 59

## 2022-12-20 DIAGNOSIS — M6281 Muscle weakness (generalized): Secondary | ICD-10-CM | POA: Diagnosis not present

## 2022-12-20 DIAGNOSIS — R278 Other lack of coordination: Secondary | ICD-10-CM

## 2022-12-20 DIAGNOSIS — R262 Difficulty in walking, not elsewhere classified: Secondary | ICD-10-CM

## 2022-12-20 DIAGNOSIS — R2681 Unsteadiness on feet: Secondary | ICD-10-CM

## 2022-12-20 NOTE — Therapy (Signed)
OUTPATIENT PHYSICAL THERAPY NEURO TREATMENT NOTE   Patient Name: Dana Lucas MRN: [id number] DOB:[birth year], 72 y.o., female Today's Date: 12/20/2022   PCP: Gareth Morgan MD REFERRING PROVIDER: Waylan Rocher MD   END OF SESSION:  PT End of Session - 12/20/22 1123     Visit Number 3    Number of Visits 24    Date for PT Re-Evaluation 02/28/23    Authorization Type 1/10 eval 3/5    PT Start Time 0933    PT Stop Time 1014    PT Time Calculation (min) 41 min    Equipment Utilized During Treatment Gait belt    Activity Tolerance Patient tolerated treatment well    Behavior During Therapy WFL for tasks assessed/performed              Past Medical History:  Diagnosis Date   Diabetes mellitus without complication ([place])    Hypertension    Stroke [facility])    Past Surgical History:  Procedure Laterality Date   ABDOMINAL HYSTERECTOMY     COLONOSCOPY N/A 10/06/2019   Procedure: COLONOSCOPY;  Surgeon: Toledo, Benay Pike, MD;  Location: [facility] ENDOSCOPY;  Service: Gastroenterology;  Laterality: N/A;   Patient Active Problem List   Diagnosis Date Noted   Hypertension    Diabetes mellitus without complication ([place])    Stroke ([place])    GIB (gastrointestinal bleeding)    AKI (acute kidney injury) ([place])    UTI (urinary tract infection)     ONSET DATE: 2020  REFERRING DIAG: 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   THERAPY DIAG:  Muscle weakness (generalized)  Other lack of coordination  Unsteadiness on feet  Difficulty in walking, not elsewhere classified  Rationale for Evaluation and Treatment: Rehabilitation  SUBJECTIVE:                                                                                                                                                                                             SUBJECTIVE STATEMENT: Pt reports some LUE pain, thinks is due to arthritis. Pt with swelling of LUE, pt and granddaughter  report this is chronic issue. 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 Stroke was four years ago, has had multiple strokes. Had a little bit of therapy, afterwards in [place]. Moved up to [place] about three years ago. [name] includes DM with diabetic neuropathy, stroke, HTN, AKI, UTI. Uses a walker to walk to the bathroom (two wheel and three wheel walkers).  Has a power chair for out of the house.   PAIN:  Are you having pain? Yes: NPRS scale: 4/10 Pain location: R knee pain Pain description: aching Aggravating factors: standing, moving Relieving factors: nothing known.   PRECAUTIONS: Fall  WEIGHT BEARING RESTRICTIONS: No  FALLS: Has patient fallen in last 6 months? No  LIVING ENVIRONMENT: Lives with: lives with their family Lives in: House/apartment Stairs:  ramp outside Has following equipment at home: Gilford Rile - 2 wheeled, Environmental consultant - 4 wheeled, Wheelchair (power), shower chair, Ramped entry, and chair lift and hospital bed   PLOF: Independent  PATIENT GOALS: get up and walk by herself, wants to walk with a cane.   OBJECTIVE:   DIAGNOSTIC FINDINGS: CVA in [place]; no imaging in system  COGNITION: Overall cognitive status:  a little bit per granddaughter.    SENSATION: WFL  COORDINATION: Limited LLE     POSTURE: rounded shoulders, forward head, flexed trunk , and weight shift right    LOWER EXTREMITY MMT:    MMT Right Eval Left Eval  Hip flexion 4 2  Hip extension    Hip abduction 4 2+  Hip adduction 4 2+  Knee flexion 4 2-  Knee extension 4 2+  Ankle dorsiflexion 3   Ankle plantarflexion 3   (Blank rows = not tested)   TRANSFERS: Assistive device utilized: Environmental consultant - 2 wheeled  Sit to stand: CGA and Min A; blocking of L foot  Stand to sit: CGA Chair to chair: Min A    GAIT: Gait pattern: step to pattern,  decreased stance time- Left, decreased ankle dorsiflexion- Left, and poor foot clearance- Left Distance walked: 43ft Assistive device utilized: Environmental consultant - 2 wheeled Level of assistance: CGA Comments: L knee hyperextension with weightbearing  FUNCTIONAL TESTS:  5 times sit to stand: 38 seconds with SUE support  10 meter walk test: 1 min 43 seconds with RW  PATIENT SURVEYS:  FOTO 46  TODAY'S TREATMENT:                                                                                                                              DATE: 12/20/22   Seated: LAQ 2x15 each LE. PT provides TC. Pt rates hard LLE  LAQ with 2# AW on RLE, 1.5# LLE  1x15 each LE, with PT providing hands-on assist to LLE. Pt rates hard.  Hip abduction with manual resistance 10x BLE, 10x LLE only with 3 sec hold/rep Hip adduction with pball 10x each LE, 10x LLE only, 3 sec hold/rep Soccer ball kicks 2# RLE, 1.5# LLE for sequencing/strength  x multiple reps Seated LTL step ups with 2# RLE, 1.5# LLE 2x10 each LE  Seated core crunches in chair 2x10 Seated core twist 10x   STS 3x. Limited due to L UE pain. PT provides up to mod a  Push to stand STS>gait with RW 2x6 ft with turn to chair, close CGA    PATIENT EDUCATION: Education  details: Pt educated throughout session about proper posture and technique with exercises. Improved exercise technique, movement at target joints, use of target muscles after min to mod verbal, visual, tactile cues.  Person educated: Patient and granddaughter Education method: Explanation, Demonstration, Tactile cues, and Verbal cues Education comprehension: verbalized understanding, returned demonstration, verbal cues required, tactile cues required, and needs further education  HOME EXERCISE PROGRAM: Access Code: [email or web address] URL: [email or web address] Date: 12/06/2022 Prepared by: Janna Arch  Exercises - Leg Extension  - 1 x daily - 7 x weekly - 2 sets - 10 reps - 5  hold - Seated March  - 1 x daily - 7 x weekly - 2 sets - 10 reps - 5 hold - Seated Hip Abduction  - 1 x daily - 7 x weekly - 2 sets - 10 reps - 5 hold - Seated Hip Adduction Isometrics with Ball  - 1 x daily - 7 x weekly - 2 sets - 10 reps - 5 hold - Seated Weight Shifting Without Arm Support  - 1 x daily - 7 x weekly - 2 sets - 10 reps - 5 hold   GOALS: Goals reviewed with patient? Yes  SHORT TERM GOALS: Target date: 01/03/2023    Patient will be independent in home exercise program to improve strength/mobility for better functional independence with ADLs. Baseline: Goal status: INITIAL    LONG TERM GOALS: Target date: 02/28/2023    Patient will increase FOTO score to equal to or greater than 53    to demonstrate statistically significant improvement in mobility and quality of life.  Baseline: 3/5: 46% Goal status: INITIAL  2.  Patient (> 61 years old) will complete five times sit to stand test in < 15 seconds indicating an increased LE strength and improved balance. Baseline: 3/5: 38 seconds with SUE support; blocking of LLE Goal status: INITIAL  3.  Patient will increase 10 meter walk test to >1.65m/s as to improve gait speed for better community ambulation and to reduce fall risk Baseline: 3/5: 1 min 43 seconds with RW and w/c follow Goal status: INITIAL  4.  Patient will increase BLE gross strength to 4/5 as to improve functional strength for independent gait, increased standing tolerance and increased ADL ability. Baseline: 3/5: grossly 2/5 LLE  Goal status: INITIAL    ASSESSMENT:  CLINICAL IMPRESSION: Pt somewhat limited today in ability to perform STS, due to LUE pain (not new). Focus mostly on LE strengthening in chair with emphasis throughout on upright posture to promote core strengthening. Will continue to monitor LUE and instructed pt and family member to watch for pain increase/other symptoms. The patient will benefit from skilled physical therapy to improve  function, mobility, and strength.  OBJECTIVE IMPAIRMENTS: Abnormal gait, cardiopulmonary status limiting activity, decreased activity tolerance, decreased balance, decreased coordination, decreased endurance, decreased knowledge of use of DME, decreased mobility, difficulty walking, decreased ROM, decreased strength, impaired perceived functional ability, impaired flexibility, impaired UE functional use, improper body mechanics, postural dysfunction, obesity, and pain.   ACTIVITY LIMITATIONS: carrying, lifting, bending, sitting, standing, squatting, sleeping, stairs, transfers, bed mobility, bathing, toileting, dressing, reach over head, and caring for others  PARTICIPATION LIMITATIONS: meal prep, cleaning, laundry, interpersonal relationship, driving, shopping, community activity, and church  PERSONAL FACTORS: Age, Behavior pattern, Education, Fitness, Past/current experiences, Profession, Sex, Time since onset of injury/illness/exacerbation, and 3+ comorbidities: DM with diabetic neuropathy, stroke, HTN, AKI, UTI  are also affecting patient's functional outcome.   REHAB POTENTIAL: Fair length of time since CVA  CLINICAL DECISION MAKING: Evolving/moderate complexity  EVALUATION COMPLEXITY: Moderate  PLAN:  PT FREQUENCY: 2x/week  PT DURATION: 12 weeks  PLANNED INTERVENTIONS: Therapeutic exercises, Therapeutic activity, Neuromuscular re-education, Balance training, Gait training, Patient/Family education, Self Care, Joint mobilization, Stair training, Vestibular training, Canalith repositioning, Visual/preceptual remediation/compensation, Orthotic/Fit training, DME instructions, Cognitive remediation, Electrical stimulation, Wheelchair mobility training, Spinal mobilization, Cryotherapy, Moist heat, Splintting, Taping, Traction, Ultrasound, Manual therapy, and Re-evaluation  PLAN FOR NEXT SESSION: stand in // bars, work on walking, strengthening LLE.  Zollie Pee, PT 12/20/2022, 11:27  AM

## 2022-12-22 ENCOUNTER — Ambulatory Visit: Payer: 59 | Admitting: Occupational Therapy

## 2022-12-22 ENCOUNTER — Encounter: Payer: Self-pay | Admitting: Occupational Therapy

## 2022-12-22 DIAGNOSIS — M6281 Muscle weakness (generalized): Secondary | ICD-10-CM | POA: Diagnosis not present

## 2022-12-22 NOTE — Therapy (Signed)
OUTPATIENT OCCUPATIONAL THERAPY NEURO TREATMENT NOTE  Patient Name: Dana Lucas MRN: [id number] DOB:07-09-1951, 72 y.o., female   PCP: Durenda Guthrie, MD REFERRING PROVIDER: Farrel Gordon, MD  END OF SESSION:  OT End of Session - 12/22/22 1718     Visit Number 4    Number of Visits 24    Date for OT Re-Evaluation 03/07/23    Authorization Type Progress report period starting 12/13/2022    OT Start Time 1300    OT Stop Time 1343    OT Time Calculation (min) 43 min    Activity Tolerance Patient tolerated treatment well    Behavior During Therapy [facility] for tasks assessed/performed             Past Medical History:  Diagnosis Date   Diabetes mellitus without complication ([place])    Hypertension    Stroke [facility])    Past Surgical History:  Procedure Laterality Date   ABDOMINAL HYSTERECTOMY     COLONOSCOPY N/A 10/06/2019   Procedure: COLONOSCOPY;  Surgeon: Toledo, Benay Pike, MD;  Location: [facility] ENDOSCOPY;  Service: Gastroenterology;  Laterality: N/A;   Patient Active Problem List   Diagnosis Date Noted   Hypertension    Diabetes mellitus without complication ([place])    Stroke ([place])    GIB (gastrointestinal bleeding)    AKI (acute kidney injury) ([place])    UTI (urinary tract infection)     ONSET DATE: 05/2014  REFERRING DIAG: CVA  THERAPY DIAG:  Muscle weakness (generalized)  Rationale for Evaluation and Treatment: Rehabilitation  SUBJECTIVE:   SUBJECTIVE STATEMENT: Pt's right hand has been hurting more, she is using it to help get herself up from the bed and chair.  She would like to be able to help in the kitchen. Pt reports arthritis in right hand/arm and knee   Pt accompanied by: family member grand daughter  PERTINENT HISTORY: Pt. is a 72 y.o. female who has a history of multiple CVAs beginning in 49. Pt. 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   PRECAUTIONS: None  WEIGHT BEARING RESTRICTIONS: No  PAIN:  Are you  having pain? 4/10 right hand arthritic pain  FALLS: Has patient fallen in last 6 months? No  LIVING ENVIRONMENT: Lives with: lives with their family and lives with their son Lives in: House/apartment Stairs: one story, ramped entrance Has following equipment at home: Wheelchair (power) and Wheelchair (manual), 2 wheeled walker, 3 wheeled walker, power lift, hospital bed, transfer shower bench  PLOF: Independent  PATIENT GOALS: To be able to walk again, and to drive  OBJECTIVE:   HAND DOMINANCE: Right  ADLs:  Transfers/ambulation related to ADLs: Eating: Independent Grooming: Independent UB Dressing: Independent LB Dressing: Independent pants, MaxA shoes, and socks Toileting: Assist with transfers, reports independence with toileting care Bathing: Assist from DIL Tub Shower transfers: Min-ModA shower transfers Equipment: See above  IADLs: Shopping: DIL performs shopping Light housekeeping: Unable Meal Prep: Pt. Is able to retrieve a beverage, or snack, light meal prep Community mobility: Power w/c Medication management: Grand daughter assists with pillbox set-up.  Pt. Independently takes medication Financial management: No changes Handwriting: Name: 100% legible in printed form, 75% in cursive form  MOBILITY STATUS: Needs Assist: Uses power w/c  POSTURE COMMENTS:  Sitting balance: WFL supported   FUNCTIONAL OUTCOME MEASURES: FOTO: 33 TR:39  UPPER EXTREMITY ROM:    Active ROM Right WFL Left eval  Shoulder flexion  0(93)  Shoulder abduction  30(83)  Shoulder adduction    Shoulder extension  Shoulder internal rotation    Shoulder external rotation    Elbow flexion    Elbow extension  0-87(0-122)  Wrist flexion  52  Wrist extension  10(50)  Wrist ulnar deviation    Wrist radial deviation    Wrist pronation    Wrist supination    (Blank rows = not tested)  Full Digit flexion to the [facility] Active digit extension through 75% of the range grossly Active  thumb radial, and palmar abduction  UPPER EXTREMITY MMT:     MMT Right eval Left eval  Shoulder flexion  0/5  Shoulder abduction  2-/5  Shoulder adduction    Shoulder extension    Shoulder internal rotation    Shoulder external rotation    Middle trapezius    Lower trapezius    Elbow flexion  3/5  Elbow extension    Wrist flexion    Wrist extension  2/5  Wrist ulnar deviation    Wrist radial deviation    Wrist pronation    Wrist supination    (Blank rows = not tested)  HAND FUNCTION: Grip strength: Right: 18 lbs; Left: 10 lbs, Lateral pinch: Right: 5 lbs, Left: 11 lbs, and 3 point pinch: Right: 1 lbs, Left: 15 lbs  COORDINATION: Eval: N/A  SENSATION: WFL  EDEMA: TBD  MUSCLE TONE: LUE: Hypotonic  COGNITION: Overall cognitive status: Within functional limits for tasks assessed  VISION: Subjective report:  Pt. Reports having an Eye appointment Baseline vision: Wears glasses all the time  VISION ASSESSMENT: To be further assessed in functional context  PRAXIS: Impaired: Motor planning   TODAY'S TREATMENT:   12/22/2022  Therapeutic Exercise:   Pt. performed AAROM to the left UE for shoulder flexion, ABD, ADD, ER, elbow flexion/extension, forearm supination, wrist flexion/extension and digit flexion/extension. Reviewed AAROM at the tabletop, left hand on table with a tissue underneath the hand.   Manual Therapy:  Pt. tolerated retrograde massage to the left hand for edema control. Pt. Tolerated soft tissue massage to the right hand 2/2 stiffness, and arthritis. Manual therapy was performed in preparation for ther. Ex.     Neuromuscular re-education:  Pt. worked on using the right hand for grasp, and releasing large circular tipped pegs with emphasis placed on grasp patterns, and extending wrist, and digits when releasing the pegs.               PATIENT EDUCATION: Education details: Left hand function, exercises Person educated: Patient Education method:  Explanation, Demonstration, and Verbal cues Education comprehension: needs further education  HOME EXERCISE PROGRAM: Towel squeezes grip strengthening, seated marches and knee extension (lifting legs over tub)  GOALS: Goals reviewed with patient? Yes  SHORT TERM GOALS: Target date: 01/24/2023   Pt. Will demonstrate independence with HEPs for the LUE. Baseline: Eval: No current HEP Goal status: INITIAL  LONG TERM GOALS: Target date: 03/07/2023  Pt. Will increase FOTO score by 2 points for Pt. perceived improvement with assessment specific ADL/IADL improvement. Baseline: Eval: FOTO 33, TR score: 39 Goal status: INITIAL  2.  Pt. Will improve right shoulder ROM by 10 degrees to assist with self-dressing. Baseline: Left shoulder flexion: 0(93), Abduction: 30(83) Goal status: INITIAL  3.  Pt. Will improve left wrist extension by 10 in preparation for functional reaching Baseline: Left wrist extension: 10(50) Goal status: INITIAL  4.  Pt. Will left grip strength by 5# in preparation for being able to securely stabilize ADL/IADL items at the tabletop. Baseline: Left grip strength: 10# Goal status:  INITIAL  5. Pt will perform tub transfer with leg lifter and transfer tub bench with supv/set up.  Baseline: Min A with sit<>stand from tub bench, mod A to lift L leg over tub.  Goal status: NEW  6. Pt will perform UB bathing with min A.  Baseline: Mod A  Goal status: New  ASSESSMENT:  CLINICAL IMPRESSION:  Pt. is responding well the treatment. Pt. Is able to initiate consistent wrist, and digit extension when releasing objects. Pt. continues to require assist with ROM exercises this date, facilitation of reaching patterns with left UE from seated position.  Pt . Continues to present with limited shoulder movement, has more active movement distally than proximally.  Pt. Continues to benefit from guiding with left UE for tabletop exercises to achieve greater range of motion.  Pt continues  to work at home with HEP with assist from family and working towards improving performance with self care tasks.     PERFORMANCE DEFICITS: in functional skills including ADLs, IADLs, coordination, dexterity, ROM, strength, pain, Fine motor control, and Gross motor control, cognitive skills including attention, problem solving, and safety awareness, and psychosocial skills including coping strategies, environmental adaptation, and routines and behaviors.   IMPAIRMENTS: are limiting patient from ADLs, IADLs, leisure, and social participation.   CO-MORBIDITIES: may have co-morbidities  that affects occupational performance. Patient will benefit from skilled OT to address above impairments and improve overall function.  MODIFICATION OR ASSISTANCE TO COMPLETE EVALUATION: Maximum or significant modification of tasks or assist is necessary to complete an evaluation.  OT OCCUPATIONAL PROFILE AND HISTORY: Comprehensive assessment: Review of records and extensive additional review of physical, cognitive, psychosocial history related to current functional performance.  CLINICAL DECISION MAKING: High - multiple treatment options, significant modification of task necessary  REHAB POTENTIAL: Good  EVALUATION COMPLEXITY: High   PLAN:  OT FREQUENCY: 2x/week  OT DURATION: 12 weeks  PLANNED INTERVENTIONS: self care/ADL training, therapeutic exercise, therapeutic activity, neuromuscular re-education, manual therapy, passive range of motion, functional mobility training, electrical stimulation, and paraffin  RECOMMENDED OTHER SERVICES: OT  CONSULTED AND AGREED WITH PLAN OF CARE: Patient  PLAN FOR NEXT SESSION: therapeutic exercises, ADL/AE training   Harrel Carina, MS, OTR/L    Harrel Carina, OT 12/22/2022, 9:59 PM

## 2022-12-22 NOTE — Therapy (Signed)
OUTPATIENT OCCUPATIONAL THERAPY NEURO TREATMENT NOTE  Patient Name: Dana Lucas MRN: [id number] DOB:08/28/1951, 72 y.o., female   PCP: Durenda Guthrie, MD REFERRING PROVIDER: Farrel Gordon, MD  END OF SESSION:  OT End of Session - 12/21/22 1954     Visit Number 3    Number of Visits 24    Date for OT Re-Evaluation 03/07/23    Authorization Type Progress report period starting 12/13/2022    Progress Note Due on Visit 10    OT Start Time 1015    OT Stop Time 1100    OT Time Calculation (min) 45 min    Activity Tolerance Patient tolerated treatment well    Behavior During Therapy [facility] for tasks assessed/performed             Past Medical History:  Diagnosis Date   Diabetes mellitus without complication ([place])    Hypertension    Stroke [facility])    Past Surgical History:  Procedure Laterality Date   ABDOMINAL HYSTERECTOMY     COLONOSCOPY N/A 10/06/2019   Procedure: COLONOSCOPY;  Surgeon: Toledo, Benay Pike, MD;  Location: [facility] ENDOSCOPY;  Service: Gastroenterology;  Laterality: N/A;   Patient Active Problem List   Diagnosis Date Noted   Hypertension    Diabetes mellitus without complication ([place])    Stroke ([place])    GIB (gastrointestinal bleeding)    AKI (acute kidney injury) ([place])    UTI (urinary tract infection)     ONSET DATE: 05/2014  REFERRING DIAG: CVA  THERAPY DIAG:  Muscle weakness (generalized)  Other lack of coordination  Unsteadiness on feet  Rationale for Evaluation and Treatment: Rehabilitation  SUBJECTIVE:   SUBJECTIVE STATEMENT: Pt's right hand has been hurting more, she is using it to help get herself up from the bed and chair.  She would like to be able to help in the kitchen. Pt reports arthritis in right hand/arm and knee   Pt accompanied by: family member grand daughter  PERTINENT HISTORY: Pt. is a 72 y.o. female who has a history of multiple CVAs beginning in 11. Pt. 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   PRECAUTIONS: None  WEIGHT BEARING RESTRICTIONS: No  PAIN:  Are you having pain? No  FALLS: Has patient fallen in last 6 months? No  LIVING ENVIRONMENT: Lives with: lives with their family and lives with their son Lives in: House/apartment Stairs: one story, ramped entrance Has following equipment at home: Wheelchair (power) and Wheelchair (manual), 2 wheeled walker, 3 wheeled walker, power lift, hospital bed, transfer shower bench  PLOF: Independent  PATIENT GOALS: To be able to walk again, and to drive  OBJECTIVE:   HAND DOMINANCE: Right  ADLs:  Transfers/ambulation related to ADLs: Eating: Independent Grooming: Independent UB Dressing: Independent LB Dressing: Independent pants, MaxA shoes, and socks Toileting: Assist with transfers, reports independence with toileting care Bathing: Assist from DIL Tub Shower transfers: Min-ModA shower transfers Equipment: See above  IADLs: Shopping: DIL performs shopping Light housekeeping: Unable Meal Prep: Pt. Is able to retrieve a beverage, or snack, light meal prep Community mobility: Power w/c Medication management: Grand daughter assists with pillbox set-up.  Pt. Independently takes medication Financial management: No changes Handwriting: Name: 100% legible in printed form, 75% in cursive form  MOBILITY STATUS: Needs Assist: Uses power w/c  POSTURE COMMENTS:  Sitting balance: WFL supported   FUNCTIONAL OUTCOME MEASURES: FOTO: 33 TR:39  UPPER EXTREMITY ROM:    Active ROM Right WFL Left eval  Shoulder flexion  0(93)  Shoulder abduction  30(83)  Shoulder adduction    Shoulder extension    Shoulder internal rotation    Shoulder external rotation    Elbow flexion    Elbow extension  0-87(0-122)  Wrist flexion  52  Wrist extension  10(50)  Wrist ulnar deviation    Wrist radial deviation    Wrist pronation    Wrist supination    (Blank rows = not tested)  Full Digit flexion to the [facility] Active digit  extension through 75% of the range grossly Active thumb radial, and palmar abduction  UPPER EXTREMITY MMT:     MMT Right eval Left eval  Shoulder flexion  0/5  Shoulder abduction  2-/5  Shoulder adduction    Shoulder extension    Shoulder internal rotation    Shoulder external rotation    Middle trapezius    Lower trapezius    Elbow flexion  3/5  Elbow extension    Wrist flexion    Wrist extension  2/5  Wrist ulnar deviation    Wrist radial deviation    Wrist pronation    Wrist supination    (Blank rows = not tested)  HAND FUNCTION: Grip strength: Right: 18 lbs; Left: 10 lbs, Lateral pinch: Right: 5 lbs, Left: 11 lbs, and 3 point pinch: Right: 1 lbs, Left: 15 lbs  COORDINATION: Eval: N/A  SENSATION: WFL  EDEMA: TBD  MUSCLE TONE: LUE: Hypotonic  COGNITION: Overall cognitive status: Within functional limits for tasks assessed  VISION: Subjective report:  Pt. Reports having an Eye appointment Baseline vision: Wears glasses all the time  VISION ASSESSMENT: To be further assessed in functional context  PRAXIS: Impaired: Motor planning   TODAY'S TREATMENT:   12/20/2022 Therapeutic Exercise:  Pt seen for scapular retraction from seated position for 10 reps with therapist assist and cues to facilitate retraction.  AAROM to left UE for shoulder flexion, ABD, ADD, ER, elbow flexion/extension, wrist flexion/extension and digit flexion/extension.  Pt performing jumbo wooden knob pegs for grasp and release to place and remove from grid.  Therapist providing cues and AAROM/guiding during motion.  Pt performing towel exercises on tabletop, left hand on table with towel underneath, forward flexion, ABD, ADD, circles in both directions with therapist guiding through ROM.  Sit to stand with min assist.                PATIENT EDUCATION: Education details: initiated HEP, use of leg lifter, scooting technique on mat to simulate tub bench scooting  Person educated:  Patient Education method: Explanation, Demonstration, and Verbal cues Education comprehension: needs further education  HOME EXERCISE PROGRAM: Towel squeezes grip strengthening, seated marches and knee extension (lifting legs over tub)  GOALS: Goals reviewed with patient? Yes  SHORT TERM GOALS: Target date: 01/24/2023   Pt. Will demonstrate independence with HEPs for the LUE. Baseline: Eval: No current HEP Goal status: INITIAL  LONG TERM GOALS: Target date: 03/07/2023  Pt. Will increase FOTO score by 2 points for Pt. perceived improvement with assessment specific ADL/IADL improvement. Baseline: Eval: FOTO 33, TR score: 39 Goal status: INITIAL  2.  Pt. Will improve right shoulder ROM by 10 degrees to assist with self-dressing. Baseline: Left shoulder flexion: 0(93), Abduction: 30(83) Goal status: INITIAL  3.  Pt. Will improve left wrist extension by 10 in preparation for functional reaching Baseline: Left wrist extension: 10(50) Goal status: INITIAL  4.  Pt. Will left grip strength by 5# in preparation for being able to securely stabilize  ADL/IADL items at the tabletop. Baseline: Left grip strength: 10# Goal status: INITIAL  5. Pt will perform tub transfer with leg lifter and transfer tub bench with supv/set up.  Baseline: Min A with sit<>stand from tub bench, mod A to lift L leg over tub.  Goal status: NEW  6. Pt will perform UB bathing with min A.  Baseline: Mod A  Goal status: New  ASSESSMENT:  CLINICAL IMPRESSION: Pt requiring assist with ROM exercises this date, facilitation of reaching patterns with left UE from seated position.  Pt with limited shoulder movement, has more active movement distally than proximally.  Pt benefits from guiding with left UE for tabletop exercises to achieve greater range of motion.  Pt continues to work at home with HEP with assist from family and working towards improving performance with self care tasks.     PERFORMANCE DEFICITS: in  functional skills including ADLs, IADLs, coordination, dexterity, ROM, strength, pain, Fine motor control, and Gross motor control, cognitive skills including attention, problem solving, and safety awareness, and psychosocial skills including coping strategies, environmental adaptation, and routines and behaviors.   IMPAIRMENTS: are limiting patient from ADLs, IADLs, leisure, and social participation.   CO-MORBIDITIES: may have co-morbidities  that affects occupational performance. Patient will benefit from skilled OT to address above impairments and improve overall function.  MODIFICATION OR ASSISTANCE TO COMPLETE EVALUATION: Maximum or significant modification of tasks or assist is necessary to complete an evaluation.  OT OCCUPATIONAL PROFILE AND HISTORY: Comprehensive assessment: Review of records and extensive additional review of physical, cognitive, psychosocial history related to current functional performance.  CLINICAL DECISION MAKING: High - multiple treatment options, significant modification of task necessary  REHAB POTENTIAL: Good  EVALUATION COMPLEXITY: High   PLAN:  OT FREQUENCY: 2x/week  OT DURATION: 12 weeks  PLANNED INTERVENTIONS: self care/ADL training, therapeutic exercise, therapeutic activity, neuromuscular re-education, manual therapy, passive range of motion, functional mobility training, electrical stimulation, and paraffin  RECOMMENDED OTHER SERVICES: OT  CONSULTED AND AGREED WITH PLAN OF CARE: Patient  PLAN FOR NEXT SESSION: therapeutic exercises, ADL/AE training   Audrea Bolte T Graeme Menees, OTR/L, CLT   Farmer Mccahill, OT 12/22/2022, 8:11 PM

## 2022-12-27 ENCOUNTER — Ambulatory Visit: Payer: 59 | Admitting: Occupational Therapy

## 2022-12-29 ENCOUNTER — Ambulatory Visit: Payer: 59

## 2022-12-29 DIAGNOSIS — R2681 Unsteadiness on feet: Secondary | ICD-10-CM

## 2022-12-29 DIAGNOSIS — R278 Other lack of coordination: Secondary | ICD-10-CM

## 2022-12-29 DIAGNOSIS — M6281 Muscle weakness (generalized): Secondary | ICD-10-CM

## 2022-12-29 DIAGNOSIS — R262 Difficulty in walking, not elsewhere classified: Secondary | ICD-10-CM

## 2022-12-29 NOTE — Therapy (Signed)
OUTPATIENT PHYSICAL THERAPY NEURO TREATMENT NOTE   Patient Name: Dana Lucas MRN: [id number] DOB:28-May-1951, 72 y.o., female Today's Date: 12/29/2022   PCP: Gareth Morgan MD REFERRING PROVIDER: Waylan Rocher MD   END OF SESSION:  PT End of Session - 12/29/22 1149     Visit Number 4    Number of Visits 24    Date for PT Re-Evaluation 02/28/23    Authorization Type 1/10 eval 3/5    PT Start Time 1101    PT Stop Time 1144    PT Time Calculation (min) 43 min    Equipment Utilized During Treatment Gait belt    Activity Tolerance Patient tolerated treatment well;Patient limited by pain    Behavior During Therapy WFL for tasks assessed/performed               Past Medical History:  Diagnosis Date   Diabetes mellitus without complication ([place])    Hypertension    Stroke [facility])    Past Surgical History:  Procedure Laterality Date   ABDOMINAL HYSTERECTOMY     COLONOSCOPY N/A 10/06/2019   Procedure: COLONOSCOPY;  Surgeon: Toledo, Benay Pike, MD;  Location: [facility] ENDOSCOPY;  Service: Gastroenterology;  Laterality: N/A;   Patient Active Problem List   Diagnosis Date Noted   Hypertension    Diabetes mellitus without complication ([place])    Stroke ([place])    GIB (gastrointestinal bleeding)    AKI (acute kidney injury) ([place])    UTI (urinary tract infection)     ONSET DATE: 2020  REFERRING DIAG: 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   THERAPY DIAG:  Muscle weakness (generalized)  Unsteadiness on feet  Other lack of coordination  Difficulty in walking, not elsewhere classified  Rationale for Evaluation and Treatment: Rehabilitation  SUBJECTIVE:                                                                                                                                                                                             SUBJECTIVE STATEMENT: Pt continues to report B hand swelling and  hand pain. Pt reports no  stumbles/falls. Pt reports no other updates since last seen or concerns.  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 Stroke was four years ago, has had multiple strokes. Had a little bit of therapy, afterwards in [place]. Moved up to [place] about three years ago. [name] includes DM with diabetic neuropathy, stroke, HTN, AKI, UTI. Uses a walker to walk to the bathroom (two wheel and three wheel  walkers).  Has a power chair for out of the house.   PAIN:  Are you having pain? Yes: NPRS scale: 4/10 Pain location: R knee pain Pain description: aching Aggravating factors: standing, moving Relieving factors: nothing known.   PRECAUTIONS: Fall  WEIGHT BEARING RESTRICTIONS: No  FALLS: Has patient fallen in last 6 months? No  LIVING ENVIRONMENT: Lives with: lives with their family Lives in: House/apartment Stairs:  ramp outside Has following equipment at home: Gilford Rile - 2 wheeled, Environmental consultant - 4 wheeled, Wheelchair (power), shower chair, Ramped entry, and chair lift and hospital bed   PLOF: Independent  PATIENT GOALS: get up and walk by herself, wants to walk with a cane.   OBJECTIVE:   DIAGNOSTIC FINDINGS: CVA in [place]; no imaging in system  COGNITION: Overall cognitive status:  a little bit per granddaughter.    SENSATION: WFL  COORDINATION: Limited LLE     POSTURE: rounded shoulders, forward head, flexed trunk , and weight shift right    LOWER EXTREMITY MMT:    MMT Right Eval Left Eval  Hip flexion 4 2  Hip extension    Hip abduction 4 2+  Hip adduction 4 2+  Knee flexion 4 2-  Knee extension 4 2+  Ankle dorsiflexion 3   Ankle plantarflexion 3   (Blank rows = not tested)   TRANSFERS: Assistive device utilized: Environmental consultant - 2 wheeled  Sit to stand: CGA and Min A; blocking of L foot  Stand to sit: CGA Chair to chair: Min A    GAIT: Gait pattern:  step to pattern, decreased stance time- Left, decreased ankle dorsiflexion- Left, and poor foot clearance- Left Distance walked: 37ft Assistive device utilized: Environmental consultant - 2 wheeled Level of assistance: CGA Comments: L knee hyperextension with weightbearing  FUNCTIONAL TESTS:  5 times sit to stand: 38 seconds with SUE support  10 meter walk test: 1 min 43 seconds with RW  PATIENT SURVEYS:  FOTO 46  TODAY'S TREATMENT:                                                                                                                              DATE: 12/29/22  TE:  In // bars FWD/BCKWD gait in // bars 2x for each with close CGA. Standing LTL weight shifts 10x each side   STS 4x4. Rates medium  Seated: 2# AW RLE, and 1.5# AW LLE -soccer ball kicks to promote sequencing/strength and quad activation  LAQ with 2# AW RLE no AW LLE - 12x RLE, 2x12 LLE with TC provided  YTB hamstring curls RLE 2x10 Heel slides LLE 2x10 with PT providing hands-on assist Seated core crunches 2x10. Attempted second set with yellow weighted ball, but discontinued due to reports of R hand pain.  Close CGA gait 2-4 ft power chair<>standard chair with use of HW for AD.  PT monitored pt closely throughout for pain in hands, asked pt if pain present with interventions. Pt with no pain  with interventions, with exception when attempting seated core crunch with weighted ball, however this was immediately terminated upon reports of pain.   PATIENT EDUCATION: Education details: Pt educated throughout session about proper posture and technique with exercises. Improved exercise technique, movement at target joints, use of target muscles after min to mod verbal, visual, tactile cues.  Person educated: Patient and granddaughter Education method: Explanation, Demonstration, Tactile cues, and Verbal cues Education comprehension: verbalized understanding, returned demonstration, verbal cues required, tactile cues required,  and needs further education  HOME EXERCISE PROGRAM: Access Code: [email or web address] URL: [email or web address] Date: 12/06/2022 Prepared by: Janna Arch  Exercises - Leg Extension  - 1 x daily - 7 x weekly - 2 sets - 10 reps - 5 hold - Seated March  - 1 x daily - 7 x weekly - 2 sets - 10 reps - 5 hold - Seated Hip Abduction  - 1 x daily - 7 x weekly - 2 sets - 10 reps - 5 hold - Seated Hip Adduction Isometrics with Ball  - 1 x daily - 7 x weekly - 2 sets - 10 reps - 5 hold - Seated Weight Shifting Without Arm Support  - 1 x daily - 7 x weekly - 2 sets - 10 reps - 5 hold   GOALS: Goals reviewed with patient? Yes  SHORT TERM GOALS: Target date: 01/03/2023    Patient will be independent in home exercise program to improve strength/mobility for better functional independence with ADLs. Baseline: Goal status: INITIAL    LONG TERM GOALS: Target date: 02/28/2023    Patient will increase FOTO score to equal to or greater than 53    to demonstrate statistically significant improvement in mobility and quality of life.  Baseline: 3/5: 46% Goal status: INITIAL  2.  Patient (> 35 years old) will complete five times sit to stand test in < 15 seconds indicating an increased LE strength and improved balance. Baseline: 3/5: 38 seconds with SUE support; blocking of LLE Goal status: INITIAL  3.  Patient will increase 10 meter walk test to >1.71m/s as to improve gait speed for better community ambulation and to reduce fall risk Baseline: 3/5: 1 min 43 seconds with RW and w/c follow Goal status: INITIAL  4.  Patient will increase BLE gross strength to 4/5 as to improve functional strength for independent gait, increased standing tolerance and increased ADL ability. Baseline: 3/5: grossly 2/5 LLE  Goal status: INITIAL    ASSESSMENT:  CLINICAL IMPRESSION: Pt with excellent motivation to participate in session. She is able to perform almost all interventions without pain, exception when  attempting core crunch holding yellow weighted ball, however, this was immediately terminated. Pt progresses number of STS and upright activities today. The patient will benefit from skilled physical therapy to improve function, mobility, and strength.  OBJECTIVE IMPAIRMENTS: Abnormal gait, cardiopulmonary status limiting activity, decreased activity tolerance, decreased balance, decreased coordination, decreased endurance, decreased knowledge of use of DME, decreased mobility, difficulty walking, decreased ROM, decreased strength, impaired perceived functional ability, impaired flexibility, impaired UE functional use, improper body mechanics, postural dysfunction, obesity, and pain.   ACTIVITY LIMITATIONS: carrying, lifting, bending, sitting, standing, squatting, sleeping, stairs, transfers, bed mobility, bathing, toileting, dressing, reach over head, and caring for others  PARTICIPATION LIMITATIONS: meal prep, cleaning, laundry, interpersonal relationship, driving, shopping, community activity, and church  PERSONAL FACTORS: Age, Behavior pattern, Education, Fitness, Past/current experiences, Profession, Sex, Time since onset of injury/illness/exacerbation, and 3+ comorbidities: DM with diabetic neuropathy, stroke, HTN, AKI,  UTI  are also affecting patient's functional outcome.   REHAB POTENTIAL: Fair length of time since CVA  CLINICAL DECISION MAKING: Evolving/moderate complexity  EVALUATION COMPLEXITY: Moderate  PLAN:  PT FREQUENCY: 2x/week  PT DURATION: 12 weeks  PLANNED INTERVENTIONS: Therapeutic exercises, Therapeutic activity, Neuromuscular re-education, Balance training, Gait training, Patient/Family education, Self Care, Joint mobilization, Stair training, Vestibular training, Canalith repositioning, Visual/preceptual remediation/compensation, Orthotic/Fit training, DME instructions, Cognitive remediation, Electrical stimulation, Wheelchair mobility training, Spinal mobilization,  Cryotherapy, Moist heat, Splintting, Taping, Traction, Ultrasound, Manual therapy, and Re-evaluation  PLAN FOR NEXT SESSION: stand in // bars, work on walking, strengthening LLE.  Zollie Pee, PT 12/29/2022, 11:55 AM

## 2022-12-30 NOTE — Therapy (Signed)
OUTPATIENT OCCUPATIONAL THERAPY NEURO TREATMENT NOTE  Patient Name: Dana Lucas MRN: [id number] DOB:24-Mar-1951, 72 y.o., female   PCP: Durenda Guthrie, MD REFERRING PROVIDER: Farrel Gordon, MD  END OF SESSION:  OT End of Session - 12/30/22 0946     Visit Number 5    Number of Visits 24    Date for OT Re-Evaluation 03/07/23    Authorization Type Progress report period starting 12/13/2022    Progress Note Due on Visit 10    OT Start Time 1145    OT Stop Time 1225    OT Time Calculation (min) 40 min    Activity Tolerance Patient tolerated treatment well    Behavior During Therapy [facility] for tasks assessed/performed             Past Medical History:  Diagnosis Date   Diabetes mellitus without complication ([place])    Hypertension    Stroke [facility] [place])    Past Surgical History:  Procedure Laterality Date   ABDOMINAL HYSTERECTOMY     COLONOSCOPY N/A 10/06/2019   Procedure: COLONOSCOPY;  Surgeon: Toledo, Benay Pike, MD;  Location: [facility] ENDOSCOPY;  Service: Gastroenterology;  Laterality: N/A;   Patient Active Problem List   Diagnosis Date Noted   Hypertension    Diabetes mellitus without complication ([place])    Stroke ([place])    GIB (gastrointestinal bleeding)    AKI (acute kidney injury) ([place])    UTI (urinary tract infection)     ONSET DATE: 05/2014  REFERRING DIAG: CVA  THERAPY DIAG:  Muscle weakness (generalized)  Other lack of coordination  Rationale for Evaluation and Treatment: Rehabilitation  SUBJECTIVE:   SUBJECTIVE STATEMENT: Pt quite drowsy today and stated that she did not sleep well last night. Pt accompanied by: family member grand daughter  PERTINENT HISTORY: Pt. is a 72 y.o. female who has a history of multiple CVAs beginning in 65. Pt. 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   PRECAUTIONS: None  WEIGHT BEARING RESTRICTIONS: No  PAIN:  Are you having pain? 9/10 right hand arthritic pain reported beginning of  session, but reduced to 3/10 pain by end of session with use of moist heat to this hand for duration of session while focus was on the LUE today.  FALLS: Has patient fallen in last 6 months? No  LIVING ENVIRONMENT: Lives with: lives with their family and lives with their son Lives in: House/apartment Stairs: one story, ramped entrance Has following equipment at home: Wheelchair (power) and Wheelchair (manual), 2 wheeled walker, 3 wheeled walker, power lift, hospital bed, transfer shower bench  PLOF: Independent  PATIENT GOALS: To be able to walk again, and to drive  OBJECTIVE:   HAND DOMINANCE: Right  ADLs:  Transfers/ambulation related to ADLs: Eating: Independent Grooming: Independent UB Dressing: Independent LB Dressing: Independent pants, MaxA shoes, and socks Toileting: Assist with transfers, reports independence with toileting care Bathing: Assist from DIL Tub Shower transfers: Min-ModA shower transfers Equipment: See above  IADLs: Shopping: DIL performs shopping Light housekeeping: Unable Meal Prep: Pt. Is able to retrieve a beverage, or snack, light meal prep Community mobility: Power w/c Medication management: Grand daughter assists with pillbox set-up.  Pt. Independently takes medication Financial management: No changes Handwriting: Name: 100% legible in printed form, 75% in cursive form  MOBILITY STATUS: Needs Assist: Uses power w/c  POSTURE COMMENTS:  Sitting balance: WFL supported   FUNCTIONAL OUTCOME MEASURES: FOTO: 33 TR:39  UPPER EXTREMITY ROM:    Active ROM Right WFL Left eval  Shoulder flexion  0(93)  Shoulder abduction  30(83)  Shoulder adduction    Shoulder extension    Shoulder internal rotation    Shoulder external rotation    Elbow flexion    Elbow extension  0-87(0-122)  Wrist flexion  52  Wrist extension  10(50)  Wrist ulnar deviation    Wrist radial deviation    Wrist pronation    Wrist supination    (Blank rows = not  tested)  Full Digit flexion to the [facility] Active digit extension through 75% of the range grossly Active thumb radial, and palmar abduction  UPPER EXTREMITY MMT:     MMT Right eval Left eval  Shoulder flexion  0/5  Shoulder abduction  2-/5  Shoulder adduction    Shoulder extension    Shoulder internal rotation    Shoulder external rotation    Middle trapezius    Lower trapezius    Elbow flexion  3/5  Elbow extension    Wrist flexion    Wrist extension  2/5  Wrist ulnar deviation    Wrist radial deviation    Wrist pronation    Wrist supination    (Blank rows = not tested)  HAND FUNCTION: Grip strength: Right: 18 lbs; Left: 10 lbs, Lateral pinch: Right: 5 lbs, Left: 11 lbs, and 3 point pinch: Right: 1 lbs, Left: 15 lbs  COORDINATION: Eval: N/A  SENSATION: WFL  EDEMA: TBD  MUSCLE TONE: LUE: Hypotonic  COGNITION: Overall cognitive status: Within functional limits for tasks assessed  VISION: Subjective report:  Pt. Reports having an Eye appointment Baseline vision: Wears glasses all the time  VISION ASSESSMENT: To be further assessed in functional context  PRAXIS: Impaired: Motor planning  TODAY'S TREATMENT:   Therapeutic Exercise: Facilitated LUE strengthening this date, working to increase strength to better engage the LUE with self care tasks.  Pt performed L hand grip squeezes with a rolled washcloth for 3 sets 10 reps each.  Pt performed L bicep curl and tricep ext for 2 sets 10 reps with light manual resistance provided by OT, with OT also providing support to maintain elbow at side with a depressed shoulder to prevent compensation with accessory muscle groups.  Facilitated L lateral pinch strengthening working to remove yellow and red light resistive clothespins from a small dowel.  Dowel was positioned to facilitate slight active wrist extension while OT supported forearm and elbow to compensate for proximal weakness.  Pt performed reps of active assisted  forearm pron/sup for 2 sets 10 reps each.  Neuro re-ed: Facilitated L hand grasp/release and forward/lateral reaching patterns working to reach for palm sized balls on table top level.  Support was given to forearm and elbow to compensate for proximal weakness.             PATIENT EDUCATION: Education details: LUE strengthening exercises Person educated: Patient Education method: Explanation, Demonstration, and Verbal cues Education comprehension: needs further education  HOME EXERCISE PROGRAM: Towel squeezes grip strengthening, seated marches and knee extension (lifting legs over tub)  GOALS: Goals reviewed with patient? Yes  SHORT TERM GOALS: Target date: 01/24/2023   Pt. Will demonstrate independence with HEPs for the LUE. Baseline: Eval: No current HEP Goal status: INITIAL  LONG TERM GOALS: Target date: 03/07/2023  Pt. Will increase FOTO score by 2 points for Pt. perceived improvement with assessment specific ADL/IADL improvement. Baseline: Eval: FOTO 33, TR score: 39 Goal status: INITIAL  2.  Pt. Will improve right shoulder ROM by 10 degrees to  assist with self-dressing. Baseline: Left shoulder flexion: 0(93), Abduction: 30(83) Goal status: INITIAL  3.  Pt. Will improve left wrist extension by 10 in preparation for functional reaching Baseline: Left wrist extension: 10(50) Goal status: INITIAL  4.  Pt. Will left grip strength by 5# in preparation for being able to securely stabilize ADL/IADL items at the tabletop. Baseline: Left grip strength: 10# Goal status: INITIAL  5. Pt will perform tub transfer with leg lifter and transfer tub bench with supv/set up.  Baseline: Min A with sit<>stand from tub bench, mod A to lift L leg over tub.  Goal status: NEW  6. Pt will perform UB bathing with min A.  Baseline: Mod A  Goal status: New  ASSESSMENT:  CLINICAL IMPRESSION: Pt quite drowsy today and stated that she did not sleep well last night.  OT had to provide  intermittent verbal and tactile cues to maintain attention to exercises d/t pt's drowsiness.  Ended session 5 min early d/t pt excessively sleepy.  Pt reported 9/10 arthritic pain in the right hand today at beginning of session, but reduced to 3/10 pain by end of session with use of moist heat to this hand for duration of session while focus was on the LUE today.  Pt continues to present with limited shoulder movement, has more active movement distally than proximally.  OT provided support to the forearm and elbow during wrist and hand strengthening and grasp/release activities to compensate for proximal weakness.  Pt continues to benefit from skilled OT to improve BUE strength and LUE coordination in order to maximize indep with daily tasks.    PERFORMANCE DEFICITS: in functional skills including ADLs, IADLs, coordination, dexterity, ROM, strength, pain, Fine motor control, and Gross motor control, cognitive skills including attention, problem solving, and safety awareness, and psychosocial skills including coping strategies, environmental adaptation, and routines and behaviors.   IMPAIRMENTS: are limiting patient from ADLs, IADLs, leisure, and social participation.   CO-MORBIDITIES: may have co-morbidities  that affects occupational performance. Patient will benefit from skilled OT to address above impairments and improve overall function.  MODIFICATION OR ASSISTANCE TO COMPLETE EVALUATION: Maximum or significant modification of tasks or assist is necessary to complete an evaluation.  OT OCCUPATIONAL PROFILE AND HISTORY: Comprehensive assessment: Review of records and extensive additional review of physical, cognitive, psychosocial history related to current functional performance.  CLINICAL DECISION MAKING: High - multiple treatment options, significant modification of task necessary  REHAB POTENTIAL: Good  EVALUATION COMPLEXITY: High   PLAN:  OT FREQUENCY: 2x/week  OT DURATION: 12  weeks  PLANNED INTERVENTIONS: self care/ADL training, therapeutic exercise, therapeutic activity, neuromuscular re-education, manual therapy, passive range of motion, functional mobility training, electrical stimulation, and paraffin  RECOMMENDED OTHER SERVICES: OT  CONSULTED AND AGREED WITH PLAN OF CARE: Patient  PLAN FOR NEXT SESSION: therapeutic exercises, ADL/AE training   Leta Speller, MS, OTR/L  Darleene Cleaver, OT 12/30/2022, 9:47 AM

## 2023-01-03 ENCOUNTER — Ambulatory Visit: Payer: 59 | Attending: Internal Medicine

## 2023-01-03 ENCOUNTER — Ambulatory Visit: Payer: 59

## 2023-01-03 DIAGNOSIS — M6281 Muscle weakness (generalized): Secondary | ICD-10-CM | POA: Diagnosis present

## 2023-01-03 DIAGNOSIS — M79641 Pain in right hand: Secondary | ICD-10-CM | POA: Diagnosis present

## 2023-01-03 DIAGNOSIS — R262 Difficulty in walking, not elsewhere classified: Secondary | ICD-10-CM

## 2023-01-03 DIAGNOSIS — R2681 Unsteadiness on feet: Secondary | ICD-10-CM | POA: Diagnosis present

## 2023-01-03 DIAGNOSIS — R278 Other lack of coordination: Secondary | ICD-10-CM

## 2023-01-03 NOTE — Therapy (Signed)
OUTPATIENT OCCUPATIONAL THERAPY NEURO TREATMENT NOTE  Patient Name: Dana Lucas MRN: [id number] DOB:07-16-1951, 72 y.o., female   PCP: Durenda Guthrie, MD REFERRING PROVIDER: Farrel Gordon, MD  END OF SESSION:  OT End of Session - 01/03/23 1626     Visit Number 6    Number of Visits 24    Date for OT Re-Evaluation 03/07/23    Authorization Type Progress report period starting 12/13/2022    Progress Note Due on Visit 10    OT Start Time 0940    OT Stop Time 1025    OT Time Calculation (min) 45 min    Activity Tolerance Patient tolerated treatment well    Behavior During Therapy [facility] for tasks assessed/performed             Past Medical History:  Diagnosis Date   Diabetes mellitus without complication    Hypertension    Stroke    Past Surgical History:  Procedure Laterality Date   ABDOMINAL HYSTERECTOMY     COLONOSCOPY N/A 10/06/2019   Procedure: COLONOSCOPY;  Surgeon: Toledo, Benay Pike, MD;  Location: [facility] ENDOSCOPY;  Service: Gastroenterology;  Laterality: N/A;   Patient Active Problem List   Diagnosis Date Noted   Hypertension    Diabetes mellitus without complication    Stroke    GIB (gastrointestinal bleeding)    AKI (acute kidney injury)    UTI (urinary tract infection)     ONSET DATE: 05/2014  REFERRING DIAG: CVA  THERAPY DIAG:  Muscle weakness (generalized)  Other lack of coordination  Rationale for Evaluation and Treatment: Rehabilitation  SUBJECTIVE:   SUBJECTIVE STATEMENT: Pt reports doing well today.  Pt acknowledged being less drowsy today but stated that she just doesn't sleep well at night time.   Pt accompanied by: family member grand daughter  PERTINENT HISTORY: Pt. is a 72 y.o. female who has a history of multiple CVAs beginning in 63. Pt. 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   PRECAUTIONS: None  WEIGHT BEARING RESTRICTIONS: No  PAIN:  Are you having pain? 9/10 right hand arthritic pain  reported beginning of session, but reduced to 3/10 pain by end of session with use of moist heat to this hand for duration of session while focus was on the LUE today.  FALLS: Has patient fallen in last 6 months? No  LIVING ENVIRONMENT: Lives with: lives with their family and lives with their son Lives in: House/apartment Stairs: one story, ramped entrance Has following equipment at home: Wheelchair (power) and Wheelchair (manual), 2 wheeled walker, 3 wheeled walker, power lift, hospital bed, transfer shower bench  PLOF: Independent  PATIENT GOALS: To be able to walk again, and to drive  OBJECTIVE:   HAND DOMINANCE: Right  ADLs:  Transfers/ambulation related to ADLs: Eating: Independent Grooming: Independent UB Dressing: Independent LB Dressing: Independent pants, MaxA shoes, and socks Toileting: Assist with transfers, reports independence with toileting care Bathing: Assist from DIL Tub Shower transfers: Min-ModA shower transfers Equipment: See above  IADLs: Shopping: DIL performs shopping Light housekeeping: Unable Meal Prep: Pt. Is able to retrieve a beverage, or snack, light meal prep Community mobility: Power w/c Medication management: Grand daughter assists with pillbox set-up.  Pt. Independently takes medication Financial management: No changes Handwriting: Name: 100% legible in printed form, 75% in cursive form  MOBILITY STATUS: Needs Assist: Uses power w/c  POSTURE COMMENTS:  Sitting balance: WFL supported   FUNCTIONAL OUTCOME MEASURES: FOTO: 33 TR:39  UPPER EXTREMITY ROM:    Active  ROM Right WFL Left eval  Shoulder flexion  0(93)  Shoulder abduction  30(83)  Shoulder adduction    Shoulder extension    Shoulder internal rotation    Shoulder external rotation    Elbow flexion    Elbow extension  0-87(0-122)  Wrist flexion  52  Wrist extension  10(50)  Wrist ulnar deviation    Wrist radial deviation    Wrist pronation    Wrist supination     (Blank rows = not tested)  Full Digit flexion to the [facility] Active digit extension through 75% of the range grossly Active thumb radial, and palmar abduction  UPPER EXTREMITY MMT:     MMT Right eval Left eval  Shoulder flexion  0/5  Shoulder abduction  2-/5  Shoulder adduction    Shoulder extension    Shoulder internal rotation    Shoulder external rotation    Middle trapezius    Lower trapezius    Elbow flexion  3/5  Elbow extension    Wrist flexion    Wrist extension  2/5  Wrist ulnar deviation    Wrist radial deviation    Wrist pronation    Wrist supination    (Blank rows = not tested)  HAND FUNCTION: Grip strength: Right: 18 lbs; Left: 10 lbs, Lateral pinch: Right: 5 lbs, Left: 11 lbs, and 3 point pinch: Right: 1 lbs, Left: 15 lbs  COORDINATION: Eval: N/A  SENSATION: WFL  EDEMA: TBD  MUSCLE TONE: LUE: Hypotonic  COGNITION: Overall cognitive status: Within functional limits for tasks assessed  VISION: Subjective report:  Pt. Reports having an Eye appointment Baseline vision: Wears glasses all the time  VISION ASSESSMENT: To be further assessed in functional context  PRAXIS: Impaired: Motor planning  TODAY'S TREATMENT:   Moist heat applied to R hand for pain reduction/muscle relaxation used for most of session while pt participated in LUE strengthening exercises.  Therapeutic Exercise: Facilitated LUE strengthening this date, working to increase strength to better engage the LUE with self care tasks.  Pt performed L hand grip squeezes with a rolled washcloth for 3 sets 10 reps each.  Pt performed reps of active assisted wrist extension against gravity, and provided light manual resistance to complete wrist flexion (gravity assisted).  Pt performed active assisted forearm pron/sup for 2 sets 10 reps each.  Pt performed L bicep curl and tricep ext for 2 sets 10 reps with light manual resistance provided by OT, with OT also providing support to maintain elbow  at side with a depressed shoulder to prevent compensation with accessory muscle groups.  Pt performed AAROM for the L shoulder horiz abd/add, shoulder flex, abd, and IR/ER with elbow at side for 2 sets 10 reps each.  At table top, pt performed active assisted horiz abd/add and forward flexion with washcloth beneath hand to perform table slides in above planes with OT providing mod A to maintain form and maximize ROM within each shoulder plane; 2 sets 10 reps each.  Facilitated L lateral pinch strengthening working to remove yellow and red light resistive clothespins from a small dowel.  OT supported forearm and elbow to compensate for proximal weakness.               PATIENT EDUCATION: Education details: LUE strengthening exercises Person educated: Patient Education method: Explanation, Demonstration, and Verbal cues Education comprehension: needs further education  HOME EXERCISE PROGRAM: Towel squeezes grip strengthening, seated marches and knee extension (lifting legs over tub)  GOALS: Goals reviewed with patient?  Yes  SHORT TERM GOALS: Target date: 01/24/2023   Pt. Will demonstrate independence with HEPs for the LUE. Baseline: Eval: No current HEP Goal status: INITIAL  LONG TERM GOALS: Target date: 03/07/2023  Pt. Will increase FOTO score by 2 points for Pt. perceived improvement with assessment specific ADL/IADL improvement. Baseline: Eval: FOTO 33, TR score: 39 Goal status: INITIAL  2.  Pt. Will improve right shoulder ROM by 10 degrees to assist with self-dressing. Baseline: Left shoulder flexion: 0(93), Abduction: 30(83) Goal status: INITIAL  3.  Pt. Will improve left wrist extension by 10 in preparation for functional reaching Baseline: Left wrist extension: 10(50) Goal status: INITIAL  4.  Pt. Will left grip strength by 5# in preparation for being able to securely stabilize ADL/IADL items at the tabletop. Baseline: Left grip strength: 10# Goal status: INITIAL  5. Pt will  perform tub transfer with leg lifter and transfer tub bench with supv/set up.  Baseline: Min A with sit<>stand from tub bench, mod A to lift L leg over tub.  Goal status: NEW  6. Pt will perform UB bathing with min A.  Baseline: Mod A  Goal status: New  ASSESSMENT:  CLINICAL IMPRESSION: Pt more alert today and responded well to [facility] music to maintain level of alertness and attention to therapeutic exercises.  Pt reported 9/10 arthritic pain again in the right hand today at beginning of session, but reduced to 3/10 pain by end of session with use of moist heat to this hand for duration of session while focus was on LUE strengthening.  Pt continues to present with limited shoulder movement, has more active movement distally than proximally.  Pt tolerated AAROM well today at the L shoulder, elbow, forearm, and wrist, working to increase strength to better engage the LUE with daily tasks.  At this time, pt reports that she uses the L hand to stabilize her dinner plate.  Pt continues to benefit from skilled OT to improve BUE strength and LUE coordination in order to maximize indep with daily tasks.    PERFORMANCE DEFICITS: in functional skills including ADLs, IADLs, coordination, dexterity, ROM, strength, pain, Fine motor control, and Gross motor control, cognitive skills including attention, problem solving, and safety awareness, and psychosocial skills including coping strategies, environmental adaptation, and routines and behaviors.   IMPAIRMENTS: are limiting patient from ADLs, IADLs, leisure, and social participation.   CO-MORBIDITIES: may have co-morbidities  that affects occupational performance. Patient will benefit from skilled OT to address above impairments and improve overall function.  MODIFICATION OR ASSISTANCE TO COMPLETE EVALUATION: Maximum or significant modification of tasks or assist is necessary to complete an evaluation.  OT OCCUPATIONAL PROFILE AND HISTORY: Comprehensive  assessment: Review of records and extensive additional review of physical, cognitive, psychosocial history related to current functional performance.  CLINICAL DECISION MAKING: High - multiple treatment options, significant modification of task necessary  REHAB POTENTIAL: Good  EVALUATION COMPLEXITY: High   PLAN:  OT FREQUENCY: 2x/week  OT DURATION: 12 weeks  PLANNED INTERVENTIONS: self care/ADL training, therapeutic exercise, therapeutic activity, neuromuscular re-education, manual therapy, passive range of motion, functional mobility training, electrical stimulation, and paraffin  RECOMMENDED OTHER SERVICES: OT  CONSULTED AND AGREED WITH PLAN OF CARE: Patient  PLAN FOR NEXT SESSION: therapeutic exercises, ADL/AE training   Leta Speller, MS, OTR/L  Darleene Cleaver, OT 01/03/2023, 4:30 PM

## 2023-01-03 NOTE — Therapy (Signed)
OUTPATIENT PHYSICAL THERAPY NEURO TREATMENT NOTE   Patient Name: Keturah Morris MRN: [id number] DOB:May 13, 1951, 72 y.o., female Today's Date: 01/03/2023   PCP: Gareth Morgan MD REFERRING PROVIDER: Waylan Rocher MD   END OF SESSION:  PT End of Session - 01/03/23 0946     Visit Number 5    Number of Visits 24    Date for PT Re-Evaluation 02/28/23    Authorization Type 1/10 eval 3/5    PT Start Time 0851    PT Stop Time 0930    PT Time Calculation (min) 39 min    Equipment Utilized During Treatment Gait belt    Activity Tolerance Patient tolerated treatment well;No increased pain    Behavior During Therapy WFL for tasks assessed/performed               Past Medical History:  Diagnosis Date   Diabetes mellitus without complication    Hypertension    Stroke    Past Surgical History:  Procedure Laterality Date   ABDOMINAL HYSTERECTOMY     COLONOSCOPY N/A 10/06/2019   Procedure: COLONOSCOPY;  Surgeon: Toledo, Benay Pike, MD;  Location: [facility] ENDOSCOPY;  Service: Gastroenterology;  Laterality: N/A;   Patient Active Problem List   Diagnosis Date Noted   Hypertension    Diabetes mellitus without complication    Stroke    GIB (gastrointestinal bleeding)    AKI (acute kidney injury)    UTI (urinary tract infection)     ONSET DATE: 2020  REFERRING DIAG: 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   THERAPY DIAG:  Muscle weakness (generalized)  Unsteadiness on feet  Difficulty in walking, not elsewhere classified  Other lack of coordination  Rationale for Evaluation and Treatment: Rehabilitation  SUBJECTIVE:                                                                                                                                                                                             SUBJECTIVE STATEMENT: Pt reports R hand pain (chronic). She reports she is having difficulty with her HEP due to LLE weakness. No  other concerns/updates reported Pt accompanied by: family member  PERTINENT HISTORY:   Patient presents for 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 Stroke was four years ago, has had multiple strokes. Had a little bit of therapy, afterwards in [place]. Moved up to [place] about three years ago. [name] includes DM with diabetic neuropathy, stroke, HTN, AKI, UTI. Uses a walker to walk to the bathroom (two wheel and three wheel walkers).  Has a power chair for out of  the house.   PAIN:  Are you having pain? Yes: NPRS scale: 4/10 Pain location: R knee pain Pain description: aching Aggravating factors: standing, moving Relieving factors: nothing known.   PRECAUTIONS: Fall  WEIGHT BEARING RESTRICTIONS: No  FALLS: Has patient fallen in last 6 months? No  LIVING ENVIRONMENT: Lives with: lives with their family Lives in: House/apartment Stairs:  ramp outside Has following equipment at home: Gilford Rile - 2 wheeled, Environmental consultant - 4 wheeled, Wheelchair (power), shower chair, Ramped entry, and chair lift and hospital bed   PLOF: Independent  PATIENT GOALS: get up and walk by herself, wants to walk with a cane.   OBJECTIVE:   DIAGNOSTIC FINDINGS: CVA in [place]; no imaging in system  COGNITION: Overall cognitive status:  a little bit per granddaughter.    SENSATION: WFL  COORDINATION: Limited LLE     POSTURE: rounded shoulders, forward head, flexed trunk , and weight shift right    LOWER EXTREMITY MMT:    MMT Right Eval Left Eval  Hip flexion 4 2  Hip extension    Hip abduction 4 2+  Hip adduction 4 2+  Knee flexion 4 2-  Knee extension 4 2+  Ankle dorsiflexion 3   Ankle plantarflexion 3   (Blank rows = not tested)   TRANSFERS: Assistive device utilized: Environmental consultant - 2 wheeled  Sit to stand: CGA and Min A; blocking of L foot  Stand to sit: CGA Chair to chair: Min A    GAIT: Gait pattern: step to pattern, decreased stance time- Left,  decreased ankle dorsiflexion- Left, and poor foot clearance- Left Distance walked: 67ft Assistive device utilized: Environmental consultant - 2 wheeled Level of assistance: CGA Comments: L knee hyperextension with weightbearing  FUNCTIONAL TESTS:  5 times sit to stand: 38 seconds with SUE support  10 meter walk test: 1 min 43 seconds with RW  PATIENT SURVEYS:  FOTO 46  TODAY'S TREATMENT:                                                                                                                              DATE: 01/03/23  TE:  Seated LAQ 3x10 each LE. Rates hard, VC/TC provided Seated adductor squeeze with pball 3x10 with 3 sec hold/rep bilateral LE  Seated march 3x10 each LE  Seated hip abduction 2x10 each LE - PT providing manual resistance. Pt rates medium. Pt then performs one more set of ten reps LLE only Seated weight shift without arm support with and without manual resistance fwd/bckwd LTL each direction x multiple reps. Pt rates easy   STS to standing with LTL weight-shifts 3x10   PATIENT EDUCATION: Education details: Pt educated throughout session about proper posture and technique with exercises. Improved exercise technique, movement at target joints, use of target muscles after min to mod verbal, visual, tactile cues.  Person educated: Patient and granddaughter Education method: Explanation, Demonstration, Tactile cues, and Verbal cues Education comprehension: verbalized understanding, returned demonstration, verbal cues  required, tactile cues required, and needs further education  HOME EXERCISE PROGRAM: Access Code: [email or web address] URL: [email or web address] Date: 12/06/2022 Prepared by: Janna Arch  Exercises - Leg Extension  - 1 x daily - 7 x weekly - 2 sets - 10 reps - 5 hold - Seated March  - 1 x daily - 7 x weekly - 2 sets - 10 reps - 5 hold - Seated Hip Abduction  - 1 x daily - 7 x weekly - 2 sets - 10 reps - 5 hold - Seated Hip Adduction Isometrics with Ball   - 1 x daily - 7 x weekly - 2 sets - 10 reps - 5 hold - Seated Weight Shifting Without Arm Support  - 1 x daily - 7 x weekly - 2 sets - 10 reps - 5 hold   GOALS: Goals reviewed with patient? Yes  SHORT TERM GOALS: Target date: 01/03/2023    Patient will be independent in home exercise program to improve strength/mobility for better functional independence with ADLs. Baseline: Goal status: INITIAL    LONG TERM GOALS: Target date: 02/28/2023    Patient will increase FOTO score to equal to or greater than 53    to demonstrate statistically significant improvement in mobility and quality of life.  Baseline: 3/5: 46% Goal status: INITIAL  2.  Patient (> 23 years old) will complete five times sit to stand test in < 15 seconds indicating an increased LE strength and improved balance. Baseline: 3/5: 38 seconds with SUE support; blocking of LLE Goal status: INITIAL  3.  Patient will increase 10 meter walk test to >1.45m/s as to improve gait speed for better community ambulation and to reduce fall risk Baseline: 3/5: 1 min 43 seconds with RW and w/c follow Goal status: INITIAL  4.  Patient will increase BLE gross strength to 4/5 as to improve functional strength for independent gait, increased standing tolerance and increased ADL ability. Baseline: 3/5: grossly 2/5 LLE  Goal status: INITIAL    ASSESSMENT:  CLINICAL IMPRESSION: Pt tolerates all interventions well today without increased pain. PT reviewed HEP in session as pt reported having difficulty at home. Pt was able to perform all interventions, but did require VC/TC throughout. PT encouraged pt to continue HEP even if moving LLE only a little bit with exercises. Pt verbalized understanding. The patient will benefit from skilled physical therapy to improve function, mobility, and strength.  OBJECTIVE IMPAIRMENTS: Abnormal gait, cardiopulmonary status limiting activity, decreased activity tolerance, decreased balance, decreased  coordination, decreased endurance, decreased knowledge of use of DME, decreased mobility, difficulty walking, decreased ROM, decreased strength, impaired perceived functional ability, impaired flexibility, impaired UE functional use, improper body mechanics, postural dysfunction, obesity, and pain.   ACTIVITY LIMITATIONS: carrying, lifting, bending, sitting, standing, squatting, sleeping, stairs, transfers, bed mobility, bathing, toileting, dressing, reach over head, and caring for others  PARTICIPATION LIMITATIONS: meal prep, cleaning, laundry, interpersonal relationship, driving, shopping, community activity, and church  PERSONAL FACTORS: Age, Behavior pattern, Education, Fitness, Past/current experiences, Profession, Sex, Time since onset of injury/illness/exacerbation, and 3+ comorbidities: DM with diabetic neuropathy, stroke, HTN, AKI, UTI  are also affecting patient's functional outcome.   REHAB POTENTIAL: Fair length of time since CVA  CLINICAL DECISION MAKING: Evolving/moderate complexity  EVALUATION COMPLEXITY: Moderate  PLAN:  PT FREQUENCY: 2x/week  PT DURATION: 12 weeks  PLANNED INTERVENTIONS: Therapeutic exercises, Therapeutic activity, Neuromuscular re-education, Balance training, Gait training, Patient/Family education, Self Care, Joint mobilization, Stair training, Vestibular training, Canalith repositioning, Visual/preceptual remediation/compensation, Orthotic/Fit training, DME  instructions, Cognitive remediation, Electrical stimulation, Wheelchair mobility training, Spinal mobilization, Cryotherapy, Moist heat, Splintting, Taping, Traction, Ultrasound, Manual therapy, and Re-evaluation  PLAN FOR NEXT SESSION: stand in // bars, work on walking, strengthening LLE.  Zollie Pee, PT 01/03/2023, 9:49 AM

## 2023-01-05 ENCOUNTER — Ambulatory Visit: Payer: 59 | Admitting: Occupational Therapy

## 2023-01-05 ENCOUNTER — Encounter: Payer: Self-pay | Admitting: Occupational Therapy

## 2023-01-05 DIAGNOSIS — M6281 Muscle weakness (generalized): Secondary | ICD-10-CM

## 2023-01-05 NOTE — Therapy (Signed)
OUTPATIENT OCCUPATIONAL THERAPY NEURO TREATMENT NOTE  Patient Name: Dana Lucas MRN: [id number] DOB:1951/05/11, 72 y.o., female   PCP: Durenda Guthrie, MD REFERRING PROVIDER: Farrel Gordon, MD  END OF SESSION:  OT End of Session - 01/05/23 1415     Visit Number 7    Number of Visits 24    Date for OT Re-Evaluation 03/07/23    Authorization Type Progress report period starting 12/13/2022    OT Start Time 1145    OT Stop Time 1230    OT Time Calculation (min) 45 min    Activity Tolerance Patient tolerated treatment well    Behavior During Therapy [facility] for tasks assessed/performed             Past Medical History:  Diagnosis Date   Diabetes mellitus without complication    Hypertension    Stroke    Past Surgical History:  Procedure Laterality Date   ABDOMINAL HYSTERECTOMY     COLONOSCOPY N/A 10/06/2019   Procedure: COLONOSCOPY;  Surgeon: Toledo, Benay Pike, MD;  Location: [facility] ENDOSCOPY;  Service: Gastroenterology;  Laterality: N/A;   Patient Active Problem List   Diagnosis Date Noted   Hypertension    Diabetes mellitus without complication    Stroke    GIB (gastrointestinal bleeding)    AKI (acute kidney injury)    UTI (urinary tract infection)     ONSET DATE: 05/2014  REFERRING DIAG: CVA  THERAPY DIAG:  Muscle weakness (generalized)  Rationale for Evaluation and Treatment: Rehabilitation  SUBJECTIVE:   SUBJECTIVE STATEMENT: Pt reports doing well today.  Pt acknowledged being less drowsy today but stated that she just doesn't sleep well at night time.   Pt accompanied by: family member grand daughter  PERTINENT HISTORY: Pt. is a 72 y.o. female who has a history of multiple CVAs beginning in 37. Pt. 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   PRECAUTIONS: None  WEIGHT BEARING RESTRICTIONS: No  PAIN:  Are you having pain? 9/10 right hand arthritic pain reported beginning of session, but reduced to 3/10 pain by end  of session with use of moist heat to this hand for duration of session while focus was on the LUE today.  FALLS: Has patient fallen in last 6 months? No  LIVING ENVIRONMENT: Lives with: lives with their family and lives with their son Lives in: House/apartment Stairs: one story, ramped entrance Has following equipment at home: Wheelchair (power) and Wheelchair (manual), 2 wheeled walker, 3 wheeled walker, power lift, hospital bed, transfer shower bench  PLOF: Independent  PATIENT GOALS: To be able to walk again, and to drive  OBJECTIVE:   HAND DOMINANCE: Right  ADLs:  Transfers/ambulation related to ADLs: Eating: Independent Grooming: Independent UB Dressing: Independent LB Dressing: Independent pants, MaxA shoes, and socks Toileting: Assist with transfers, reports independence with toileting care Bathing: Assist from DIL Tub Shower transfers: Min-ModA shower transfers Equipment: See above  IADLs: Shopping: DIL performs shopping Light housekeeping: Unable Meal Prep: Pt. Is able to retrieve a beverage, or snack, light meal prep Community mobility: Power w/c Medication management: Grand daughter assists with pillbox set-up.  Pt. Independently takes medication Financial management: No changes Handwriting: Name: 100% legible in printed form, 75% in cursive form  MOBILITY STATUS: Needs Assist: Uses power w/c  POSTURE COMMENTS:  Sitting balance: WFL supported   FUNCTIONAL OUTCOME MEASURES: FOTO: 33 TR:39  UPPER EXTREMITY ROM:    Active ROM Right WFL Left eval  Shoulder flexion  0(93)  Shoulder abduction  30(83)  Shoulder adduction    Shoulder extension    Shoulder internal rotation    Shoulder external rotation    Elbow flexion    Elbow extension  0-87(0-122)  Wrist flexion  52  Wrist extension  10(50)  Wrist ulnar deviation    Wrist radial deviation    Wrist pronation    Wrist supination    (Blank rows = not tested)  Full Digit flexion to the  [facility] Active digit extension through 75% of the range grossly Active thumb radial, and palmar abduction  UPPER EXTREMITY MMT:     MMT Right eval Left eval  Shoulder flexion  0/5  Shoulder abduction  2-/5  Shoulder adduction    Shoulder extension    Shoulder internal rotation    Shoulder external rotation    Middle trapezius    Lower trapezius    Elbow flexion  3/5  Elbow extension    Wrist flexion    Wrist extension  2/5  Wrist ulnar deviation    Wrist radial deviation    Wrist pronation    Wrist supination    (Blank rows = not tested)  HAND FUNCTION: Grip strength: Right: 18 lbs; Left: 10 lbs, Lateral pinch: Right: 5 lbs, Left: 11 lbs, and 3 point pinch: Right: 1 lbs, Left: 15 lbs  COORDINATION: Eval: N/A  SENSATION: WFL  EDEMA: TBD  MUSCLE TONE: LUE: Hypotonic  COGNITION: Overall cognitive status: Within functional limits for tasks assessed  VISION: Subjective report:  Pt. Reports having an Eye appointment Baseline vision: Wears glasses all the time  VISION ASSESSMENT: To be further assessed in functional context  PRAXIS: Impaired: Motor planning  TODAY'S TREATMENT:   Moist heat applied to R hand for pain reduction/muscle relaxation used for most of session while pt participated in LUE strengthening exercises.  Therapeutic Exercise:    Pt. performed AAROM to the left UE for shoulder flexion, ABD, ADD, ER, elbow flexion/extension, forearm supination, wrist flexion/extension and digit flexion/extension. Reviewed AAROM for hand to face patterns with the LUE with support proximally. Pt. Performed lateral, and 3pt. Pinch strengthening with yellow, and red resistive clips with cues, and assist for the hand pattern.    Manual Therapy:   Pt. tolerated retrograde massage to the left hand for edema control. Pt. Tolerated soft tissue massage to the right hand 2/2 stiffness, and arthritis. Manual therapy was performed in preparation for ther. Ex.                   PATIENT EDUCATION: Education details: LUE strengthening exercises Person educated: Patient Education method: Explanation, Demonstration, and Verbal cues Education comprehension: needs further education  HOME EXERCISE PROGRAM: Towel squeezes grip strengthening, seated marches and knee extension (lifting legs over tub)  GOALS: Goals reviewed with patient? Yes  SHORT TERM GOALS: Target date: 01/24/2023   Pt. Will demonstrate independence with HEPs for the LUE. Baseline: Eval: No current HEP Goal status: INITIAL  LONG TERM GOALS: Target date: 03/07/2023  Pt. Will increase FOTO score by 2 points for Pt. perceived improvement with assessment specific ADL/IADL improvement. Baseline: Eval: FOTO 33, TR score: 39 Goal status: INITIAL  2.  Pt. Will improve right shoulder ROM by 10 degrees to assist with self-dressing. Baseline: Left shoulder flexion: 0(93), Abduction: 30(83) Goal status: INITIAL  3.  Pt. Will improve left wrist extension by 10 in preparation for functional reaching Baseline: Left wrist extension: 10(50) Goal status: INITIAL  4.  Pt. Will left grip strength by 5# in  preparation for being able to securely stabilize ADL/IADL items at the tabletop. Baseline: Left grip strength: 10# Goal status: INITIAL  5. Pt will perform tub transfer with leg lifter and transfer tub bench with supv/set up.  Baseline: Min A with sit<>stand from tub bench, mod A to lift L leg over tub.  Goal status: NEW  6. Pt will perform UB bathing with min A.  Baseline: Mod A  Goal status: New  ASSESSMENT:  CLINICAL IMPRESSION:  Pt. continues to respond well to the treatment. Pt. is able to initiate consistent wrist, and digit extension, gross grasp, and releasing. Pt. required visual, and tactile cues to encourage increased left wrist extension. Pt. continues to require assist with ROM exercises this date, facilitation of hand to face patterns in preparation of engaging then UE for self-grooming.  Pt. continues to present with limited shoulder movement, has more active movement distally than proximally.  Pt. was able to perform lateral, and 3pt. Pinch with the right hand. Pt. continues to benefit from guiding with left UE for tabletop exercises to achieve greater range of motion.  Pt continues to work at home with HEP with assist from family and working towards improving performance with self care tasks.       PERFORMANCE DEFICITS: in functional skills including ADLs, IADLs, coordination, dexterity, ROM, strength, pain, Fine motor control, and Gross motor control, cognitive skills including attention, problem solving, and safety awareness, and psychosocial skills including coping strategies, environmental adaptation, and routines and behaviors.   IMPAIRMENTS: are limiting patient from ADLs, IADLs, leisure, and social participation.   CO-MORBIDITIES: may have co-morbidities  that affects occupational performance. Patient will benefit from skilled OT to address above impairments and improve overall function.  MODIFICATION OR ASSISTANCE TO COMPLETE EVALUATION: Maximum or significant modification of tasks or assist is necessary to complete an evaluation.  OT OCCUPATIONAL PROFILE AND HISTORY: Comprehensive assessment: Review of records and extensive additional review of physical, cognitive, psychosocial history related to current functional performance.  CLINICAL DECISION MAKING: High - multiple treatment options, significant modification of task necessary  REHAB POTENTIAL: Good  EVALUATION COMPLEXITY: High   PLAN:  OT FREQUENCY: 2x/week  OT DURATION: 12 weeks  PLANNED INTERVENTIONS: self care/ADL training, therapeutic exercise, therapeutic activity, neuromuscular re-education, manual therapy, passive range of motion, functional mobility training, electrical stimulation, and paraffin  RECOMMENDED OTHER SERVICES: OT  CONSULTED AND AGREED WITH PLAN OF CARE: Patient  PLAN FOR NEXT  SESSION: therapeutic exercises, ADL/AE training   Harrel Carina, MS, OTR/L 01/05/2023, 2:18 PM

## 2023-01-09 ENCOUNTER — Ambulatory Visit: Payer: 59

## 2023-01-12 ENCOUNTER — Ambulatory Visit: Payer: 59

## 2023-01-12 DIAGNOSIS — M6281 Muscle weakness (generalized): Secondary | ICD-10-CM | POA: Diagnosis not present

## 2023-01-12 DIAGNOSIS — R2681 Unsteadiness on feet: Secondary | ICD-10-CM

## 2023-01-12 DIAGNOSIS — M79641 Pain in right hand: Secondary | ICD-10-CM

## 2023-01-12 DIAGNOSIS — R262 Difficulty in walking, not elsewhere classified: Secondary | ICD-10-CM

## 2023-01-12 NOTE — Therapy (Signed)
OUTPATIENT PHYSICAL THERAPY NEURO TREATMENT NOTE   Patient Name: Dana Lucas MRN: [id number] DOB:13-May-1951, 72 y.o., female Today's Date: 01/12/2023   PCP: Bobbye Morton MD REFERRING PROVIDER: Louis Matte MD   END OF SESSION:  PT End of Session - 01/12/23 0947     Visit Number 6    Number of Visits 24    Date for PT Re-Evaluation 02/28/23    Authorization Type 1/10 eval 3/5    PT Start Time 0850    PT Stop Time 0930    PT Time Calculation (min) 40 min    Equipment Utilized During Treatment Gait belt    Activity Tolerance Patient tolerated treatment well;Patient limited by pain    Behavior During Therapy WFL for tasks assessed/performed               Past Medical History:  Diagnosis Date   Diabetes mellitus without complication    Hypertension    Stroke    Past Surgical History:  Procedure Laterality Date   ABDOMINAL HYSTERECTOMY     COLONOSCOPY N/A 10/06/2019   Procedure: COLONOSCOPY;  Surgeon: Toledo, Boykin Nearing, MD;  Location: [facility] ENDOSCOPY;  Service: Gastroenterology;  Laterality: N/A;   Patient Active Problem List   Diagnosis Date Noted   Hypertension    Diabetes mellitus without complication    Stroke    GIB (gastrointestinal bleeding)    AKI (acute kidney injury)    UTI (urinary tract infection)     ONSET DATE: 2020  REFERRING DIAG: 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   THERAPY DIAG:  Muscle weakness (generalized)  Unsteadiness on feet  Difficulty in walking, not elsewhere classified  Pain in right hand  Rationale for Evaluation and Treatment: Rehabilitation  SUBJECTIVE:                                                                                                                                                                                             SUBJECTIVE STATEMENT: Pt with continued R hand pain currently. She reports no stumbles/falls, exercise going well at home. Pt  accompanied by: family member  PERTINENT HISTORY:   Patient presents for 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 Stroke was four years ago, has had multiple strokes. Had a little bit of therapy, afterwards in [place]. Moved up to [place] about three years ago. [name] includes DM with diabetic neuropathy, stroke, HTN, AKI, UTI. Uses a walker to walk to the bathroom (two wheel and three wheel walkers).  Has a power chair for out of the house.   PAIN:  Are you having pain? Yes: NPRS scale: 4/10 Pain location: R knee pain Pain description: aching Aggravating factors: standing, moving Relieving factors: nothing known.   PRECAUTIONS: Fall  WEIGHT BEARING RESTRICTIONS: No  FALLS: Has patient fallen in last 6 months? No  LIVING ENVIRONMENT: Lives with: lives with their family Lives in: House/apartment Stairs:  ramp outside Has following equipment at home: [name] - 2 wheeled, Environmental consultant - 4 wheeled, Wheelchair (power), shower chair, Ramped entry, and chair lift and hospital bed   PLOF: Independent  PATIENT GOALS: get up and walk by herself, wants to walk with a cane.   OBJECTIVE:   DIAGNOSTIC FINDINGS: CVA in [place]; no imaging in system  COGNITION: Overall cognitive status:  a little bit per granddaughter.    SENSATION: WFL  COORDINATION: Limited LLE     POSTURE: rounded shoulders, forward head, flexed trunk , and weight shift right    LOWER EXTREMITY MMT:    MMT Right Eval Left Eval  Hip flexion 4 2  Hip extension    Hip abduction 4 2+  Hip adduction 4 2+  Knee flexion 4 2-  Knee extension 4 2+  Ankle dorsiflexion 3   Ankle plantarflexion 3   (Blank rows = not tested)   TRANSFERS: Assistive device utilized: Environmental consultant - 2 wheeled  Sit to stand: CGA and Min A; blocking of L foot  Stand to sit: CGA Chair to chair: Min A    GAIT: Gait pattern: step to pattern, decreased stance time- Left, decreased ankle dorsiflexion- Left,  and poor foot clearance- Left Distance walked: 53ft Assistive device utilized: Environmental consultant - 2 wheeled Level of assistance: CGA Comments: L knee hyperextension with weightbearing  FUNCTIONAL TESTS:  5 times sit to stand: 38 seconds with SUE support  10 meter walk test: 1 min 43 seconds with RW  PATIENT SURVEYS:  FOTO 46  TODAY'S TREATMENT:                                                                                                                              DATE: 01/12/23  TE:  Seated LAQ 3x12 each LE. Rates medium, VC/TC provided Seated march 3x12 each LE. Rates hard STS 2x5. Rates medium. Improved ease with movement today.  Seated adductor squeeze with pball 3x12 with 1-3 sec hold/rep bilateral LE  Seated hip abduction 3x12 each LE - PT providing TC . Challenging  Seated crunch 1x15 , seated crunch with BLE on compliant surface 1x15 Seated on dynadisc, PT providing LTL perturbations   PT provides min-mod assist throughout with STS and to complete transfer WC<>standard chair 2x  PATIENT EDUCATION: Education details: Pt educated throughout session about proper posture and technique with exercises. Improved exercise technique, movement at target joints, use of target muscles after min to mod verbal, visual, tactile cues.  Person educated: Patient and granddaughter Education method: Explanation, Demonstration, Tactile cues, and Verbal cues Education comprehension: verbalized understanding, returned demonstration, verbal cues required, tactile cues  required, and needs further education  HOME EXERCISE PROGRAM: Access Code: [email or web address] URL: [email or web address] Date: 12/06/2022 Prepared by: Precious Bard  Exercises - Leg Extension  - 1 x daily - 7 x weekly - 2 sets - 10 reps - 5 hold - Seated March  - 1 x daily - 7 x weekly - 2 sets - 10 reps - 5 hold - Seated Hip Abduction  - 1 x daily - 7 x weekly - 2 sets - 10 reps - 5 hold - Seated Hip Adduction Isometrics with  Ball  - 1 x daily - 7 x weekly - 2 sets - 10 reps - 5 hold - Seated Weight Shifting Without Arm Support  - 1 x daily - 7 x weekly - 2 sets - 10 reps - 5 hold   GOALS: Goals reviewed with patient? Yes  SHORT TERM GOALS: Target date: 01/03/2023    Patient will be independent in home exercise program to improve strength/mobility for better functional independence with ADLs. Baseline: Goal status: INITIAL    LONG TERM GOALS: Target date: 02/28/2023    Patient will increase FOTO score to equal to or greater than 53    to demonstrate statistically significant improvement in mobility and quality of life.  Baseline: 3/5: 46% Goal status: INITIAL  2.  Patient (> 27 years old) will complete five times sit to stand test in < 15 seconds indicating an increased LE strength and improved balance. Baseline: 3/5: 38 seconds with SUE support; blocking of LLE Goal status: INITIAL  3.  Patient will increase 10 meter walk test to >1.12m/s as to improve gait speed for better community ambulation and to reduce fall risk Baseline: 3/5: 1 min 43 seconds with RW and w/c follow Goal status: INITIAL  4.  Patient will increase BLE gross strength to 4/5 as to improve functional strength for independent gait, increased standing tolerance and increased ADL ability. Baseline: 3/5: grossly 2/5 LLE  Goal status: INITIAL    ASSESSMENT:  CLINICAL IMPRESSION: STS training somewhat limited due to R hand pain today, as pt relies on RUE to assist with completing activity. Otherwise pt tolerated interventions well without pain. Pt still requires min-mod a to complete STS to [facility] and to transfer WC<>standard chair. The patient will benefit from skilled physical therapy to improve function, mobility, and strength.  OBJECTIVE IMPAIRMENTS: Abnormal gait, cardiopulmonary status limiting activity, decreased activity tolerance, decreased balance, decreased coordination, decreased endurance, decreased knowledge of use of DME,  decreased mobility, difficulty walking, decreased ROM, decreased strength, impaired perceived functional ability, impaired flexibility, impaired UE functional use, improper body mechanics, postural dysfunction, obesity, and pain.   ACTIVITY LIMITATIONS: carrying, lifting, bending, sitting, standing, squatting, sleeping, stairs, transfers, bed mobility, bathing, toileting, dressing, reach over head, and caring for others  PARTICIPATION LIMITATIONS: meal prep, cleaning, laundry, interpersonal relationship, driving, shopping, community activity, and church  PERSONAL FACTORS: Age, Behavior pattern, Education, Fitness, Past/current experiences, Profession, Sex, Time since onset of injury/illness/exacerbation, and 3+ comorbidities: DM with diabetic neuropathy, stroke, HTN, AKI, UTI  are also affecting patient's functional outcome.   REHAB POTENTIAL: Fair length of time since CVA  CLINICAL DECISION MAKING: Evolving/moderate complexity  EVALUATION COMPLEXITY: Moderate  PLAN:  PT FREQUENCY: 2x/week  PT DURATION: 12 weeks  PLANNED INTERVENTIONS: Therapeutic exercises, Therapeutic activity, Neuromuscular re-education, Balance training, Gait training, Patient/Family education, Self Care, Joint mobilization, Stair training, Vestibular training, Canalith repositioning, Visual/preceptual remediation/compensation, Orthotic/Fit training, DME instructions, Cognitive remediation, Electrical stimulation, Wheelchair mobility training, Spinal mobilization, Cryotherapy, Moist heat,  Splintting, Taping, Traction, Ultrasound, Manual therapy, and Re-evaluation  PLAN FOR NEXT SESSION: stand in // bars, work on walking, strengthening LLE.  Baird KayHaley R Vessie Olmsted, PT 01/12/2023, 9:49 AM

## 2023-01-16 ENCOUNTER — Ambulatory Visit: Payer: 59

## 2023-01-16 DIAGNOSIS — R278 Other lack of coordination: Secondary | ICD-10-CM

## 2023-01-16 DIAGNOSIS — R262 Difficulty in walking, not elsewhere classified: Secondary | ICD-10-CM

## 2023-01-16 DIAGNOSIS — M6281 Muscle weakness (generalized): Secondary | ICD-10-CM | POA: Diagnosis not present

## 2023-01-16 DIAGNOSIS — R2681 Unsteadiness on feet: Secondary | ICD-10-CM

## 2023-01-16 NOTE — Therapy (Signed)
OUTPATIENT PHYSICAL THERAPY NEURO TREATMENT NOTE   Patient Name: Dana Lucas MRN: [id number] DOB:[birth year], 72 y.o., female Today's Date: 01/16/2023   PCP: Bobbye Morton MD REFERRING PROVIDER: Louis Matte MD   END OF SESSION:  PT End of Session - 01/16/23 0934     Visit Number 7    Number of Visits 24    Date for PT Re-Evaluation 02/28/23    Authorization Type 1/10 eval 3/5    PT Start Time 0931    PT Stop Time 1010    PT Time Calculation (min) 39 min    Equipment Utilized During Treatment Gait belt    Activity Tolerance Patient tolerated treatment well    Behavior During Therapy WFL for tasks assessed/performed               Past Medical History:  Diagnosis Date   Diabetes mellitus without complication    Hypertension    Stroke    Past Surgical History:  Procedure Laterality Date   ABDOMINAL HYSTERECTOMY     COLONOSCOPY N/A 10/06/2019   Procedure: COLONOSCOPY;  Surgeon: Toledo, Boykin Nearing, MD;  Location: [facility] ENDOSCOPY;  Service: Gastroenterology;  Laterality: N/A;   Patient Active Problem List   Diagnosis Date Noted   Hypertension    Diabetes mellitus without complication    Stroke    GIB (gastrointestinal bleeding)    AKI (acute kidney injury)    UTI (urinary tract infection)     ONSET DATE: 2020  REFERRING DIAG: 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   THERAPY DIAG:  Muscle weakness (generalized)  Other lack of coordination  Unsteadiness on feet  Difficulty in walking, not elsewhere classified  Rationale for Evaluation and Treatment: Rehabilitation  SUBJECTIVE:                                                                                                                                                                                             SUBJECTIVE STATEMENT: Pt reports her R hand is still sore but a little better than last time. She reports no stumbles/falls. She had a good  birthday. 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 Stroke was four years ago, has had multiple strokes. Had a little bit of therapy, afterwards in [place]. Moved up to [place] about three years ago. [name] includes DM with diabetic neuropathy, stroke, HTN, AKI, UTI. Uses a walker to walk to the bathroom (two wheel and three wheel walkers).  Has a power chair for out of the  house.   PAIN:  Are you having pain? Yes: NPRS scale: 4/10 Pain location: R knee pain Pain description: aching Aggravating factors: standing, moving Relieving factors: nothing known.   PRECAUTIONS: Fall  WEIGHT BEARING RESTRICTIONS: No  FALLS: Has patient fallen in last 6 months? No  LIVING ENVIRONMENT: Lives with: lives with their family Lives in: House/apartment Stairs:  ramp outside Has following equipment at home: [name] - 2 wheeled, Environmental consultant - 4 wheeled, Wheelchair (power), shower chair, Ramped entry, and chair lift and hospital bed   PLOF: Independent  PATIENT GOALS: get up and walk by herself, wants to walk with a cane.   OBJECTIVE:   DIAGNOSTIC FINDINGS: CVA in [place]; no imaging in system  COGNITION: Overall cognitive status:  a little bit per granddaughter.    SENSATION: WFL  COORDINATION: Limited LLE     POSTURE: rounded shoulders, forward head, flexed trunk , and weight shift right    LOWER EXTREMITY MMT:    MMT Right Eval Left Eval  Hip flexion 4 2  Hip extension    Hip abduction 4 2+  Hip adduction 4 2+  Knee flexion 4 2-  Knee extension 4 2+  Ankle dorsiflexion 3   Ankle plantarflexion 3   (Blank rows = not tested)   TRANSFERS: Assistive device utilized: Environmental consultant - 2 wheeled  Sit to stand: CGA and Min A; blocking of L foot  Stand to sit: CGA Chair to chair: Min A    GAIT: Gait pattern: step to pattern, decreased stance time- Left, decreased ankle  dorsiflexion- Left, and poor foot clearance- Left Distance walked: 38ft Assistive device utilized: Environmental consultant - 2 wheeled Level of assistance: CGA Comments: L knee hyperextension with weightbearing  FUNCTIONAL TESTS:  5 times sit to stand: 38 seconds with SUE support  10 meter walk test: 1 min 43 seconds with RW  PATIENT SURVEYS:  FOTO 46  TODAY'S TREATMENT:                                                                                                                              DATE: 01/16/23  TE:  Amb FWD/BCKWD in // bars 2x6x half way down // bars and back. Pt rates medium  STS from WC 2x5 1x3. Rates medium. PT provides mod assist   Seated with 2.5# AW on RLE, no weight LLE LAQ 4x10 each LE, exhibits improvement, but still has difficulty fully extending L knee Seated adductor squeeze with pball 4x10 bilat Seated march 4x8 each side. Very challenging. Pt requires rest break Seated clamshell 2x15 bilat   PATIENT EDUCATION: Education details: Pt educated throughout session about proper posture and technique with exercises. Improved exercise technique, movement at target joints, use of target muscles after min to mod verbal, visual, tactile cues.  Person educated: Patient and granddaughter Education method: Explanation, Demonstration, Tactile cues, and Verbal cues Education comprehension: verbalized understanding, returned demonstration, verbal cues required, tactile cues required, and needs further education  HOME EXERCISE PROGRAM: Access Code: [email or web address] URL: [email or web address] Date: 12/06/2022 Prepared by: Precious Bard  Exercises - Leg Extension  - 1 x daily - 7 x weekly - 2 sets - 10 reps - 5 hold - Seated March  - 1 x daily - 7 x weekly - 2 sets - 10 reps - 5 hold - Seated Hip Abduction  - 1 x daily - 7 x weekly - 2 sets - 10 reps - 5 hold - Seated Hip Adduction Isometrics with Ball  - 1 x daily - 7 x weekly - 2 sets - 10 reps - 5 hold - Seated Weight  Shifting Without Arm Support  - 1 x daily - 7 x weekly - 2 sets - 10 reps - 5 hold   GOALS: Goals reviewed with patient? Yes  SHORT TERM GOALS: Target date: 01/03/2023    Patient will be independent in home exercise program to improve strength/mobility for better functional independence with ADLs. Baseline: Goal status: INITIAL    LONG TERM GOALS: Target date: 02/28/2023    Patient will increase FOTO score to equal to or greater than 53    to demonstrate statistically significant improvement in mobility and quality of life.  Baseline: 3/5: 46% Goal status: INITIAL  2.  Patient (> 70 years old) will complete five times sit to stand test in < 15 seconds indicating an increased LE strength and improved balance. Baseline: 3/5: 38 seconds with SUE support; blocking of LLE Goal status: INITIAL  3.  Patient will increase 10 meter walk test to >1.39m/s as to improve gait speed for better community ambulation and to reduce fall risk Baseline: 3/5: 1 min 43 seconds with RW and w/c follow Goal status: INITIAL  4.  Patient will increase BLE gross strength to 4/5 as to improve functional strength for independent gait, increased standing tolerance and increased ADL ability. Baseline: 3/5: grossly 2/5 LLE  Goal status: INITIAL    ASSESSMENT:  CLINICAL IMPRESSION: Pt with improved R hand pain today, although still present. She tolerates interventions well without increased pain and is able to advance to more challenging therex as well as increase volume and/or intensity of other LE strengthening exercises.The patient will benefit from skilled physical therapy to improve function, mobility, and strength.  OBJECTIVE IMPAIRMENTS: Abnormal gait, cardiopulmonary status limiting activity, decreased activity tolerance, decreased balance, decreased coordination, decreased endurance, decreased knowledge of use of DME, decreased mobility, difficulty walking, decreased ROM, decreased strength, impaired  perceived functional ability, impaired flexibility, impaired UE functional use, improper body mechanics, postural dysfunction, obesity, and pain.   ACTIVITY LIMITATIONS: carrying, lifting, bending, sitting, standing, squatting, sleeping, stairs, transfers, bed mobility, bathing, toileting, dressing, reach over head, and caring for others  PARTICIPATION LIMITATIONS: meal prep, cleaning, laundry, interpersonal relationship, driving, shopping, community activity, and church  PERSONAL FACTORS: Age, Behavior pattern, Education, Fitness, Past/current experiences, Profession, Sex, Time since onset of injury/illness/exacerbation, and 3+ comorbidities: DM with diabetic neuropathy, stroke, HTN, AKI, UTI  are also affecting patient's functional outcome.   REHAB POTENTIAL: Fair length of time since CVA  CLINICAL DECISION MAKING: Evolving/moderate complexity  EVALUATION COMPLEXITY: Moderate  PLAN:  PT FREQUENCY: 2x/week  PT DURATION: 12 weeks  PLANNED INTERVENTIONS: Therapeutic exercises, Therapeutic activity, Neuromuscular re-education, Balance training, Gait training, Patient/Family education, Self Care, Joint mobilization, Stair training, Vestibular training, Canalith repositioning, Visual/preceptual remediation/compensation, Orthotic/Fit training, DME instructions, Cognitive remediation, Electrical stimulation, Wheelchair mobility training, Spinal mobilization, Cryotherapy, Moist heat, Splintting, Taping, Traction, Ultrasound, Manual therapy, and Re-evaluation  PLAN FOR  NEXT SESSION: stand in // bars, work on walking, strengthening LLE.  Baird Kay, PT 01/16/2023, 10:38 AM

## 2023-01-17 ENCOUNTER — Ambulatory Visit: Payer: 59

## 2023-01-17 ENCOUNTER — Encounter: Payer: 59 | Admitting: Speech Pathology

## 2023-01-17 ENCOUNTER — Ambulatory Visit: Payer: 59 | Admitting: Occupational Therapy

## 2023-01-19 ENCOUNTER — Ambulatory Visit: Payer: 59

## 2023-01-19 DIAGNOSIS — R262 Difficulty in walking, not elsewhere classified: Secondary | ICD-10-CM

## 2023-01-19 DIAGNOSIS — M6281 Muscle weakness (generalized): Secondary | ICD-10-CM | POA: Diagnosis not present

## 2023-01-19 DIAGNOSIS — R2681 Unsteadiness on feet: Secondary | ICD-10-CM

## 2023-01-19 DIAGNOSIS — R278 Other lack of coordination: Secondary | ICD-10-CM

## 2023-01-19 NOTE — Therapy (Signed)
OUTPATIENT PHYSICAL THERAPY NEURO TREATMENT NOTE   Patient Name: Dana Lucas MRN: [id number] DOB:Feb 27, 1951, 72 y.o., female Today's Date: 01/19/2023   PCP: Bobbye Morton MD REFERRING PROVIDER: Louis Matte MD   END OF SESSION:  PT End of Session - 01/19/23 1032     Visit Number 8    Number of Visits 24    Date for PT Re-Evaluation 02/28/23    Authorization Type 1/10 eval 3/5    PT Start Time 0850    PT Stop Time 0930    PT Time Calculation (min) 40 min    Equipment Utilized During Treatment Gait belt    Activity Tolerance Patient tolerated treatment well;No increased pain    Behavior During Therapy WFL for tasks assessed/performed                Past Medical History:  Diagnosis Date   Diabetes mellitus without complication    Hypertension    Stroke    Past Surgical History:  Procedure Laterality Date   ABDOMINAL HYSTERECTOMY     COLONOSCOPY N/A 10/06/2019   Procedure: COLONOSCOPY;  Surgeon: Toledo, Boykin Nearing, MD;  Location: [facility];  Service: Gastroenterology;  Laterality: N/A;   Patient Active Problem List   Diagnosis Date Noted   Hypertension    Diabetes mellitus without complication    Stroke    GIB (gastrointestinal bleeding)    AKI (acute kidney injury)    UTI (urinary tract infection)     ONSET DATE: 2020  REFERRING DIAG: 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   THERAPY DIAG:  Muscle weakness (generalized)  Other lack of coordination  Unsteadiness on feet  Difficulty in walking, not elsewhere classified  Rationale for Evaluation and Treatment: Rehabilitation  SUBJECTIVE:                                                                                                                                                                                             SUBJECTIVE STATEMENT: Pt reports her R hand is feeling about the same. She reports no falls/stumbles. Pt reports HEP is going  well. 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 Stroke was four years ago, has had multiple strokes. Had a little bit of therapy, afterwards in [place]. Moved up to [place] about three years ago. [name] includes DM with diabetic neuropathy, stroke, HTN, AKI, UTI. Uses a walker to walk to the bathroom (two wheel and three wheel walkers).  Has a power chair for out of the house.  PAIN:  Are you having pain? Yes: NPRS scale: 4/10 Pain location: R knee pain Pain description: aching Aggravating factors: standing, moving Relieving factors: nothing known.   PRECAUTIONS: Fall  WEIGHT BEARING RESTRICTIONS: No  FALLS: Has patient fallen in last 6 months? No  LIVING ENVIRONMENT: Lives with: lives with their family Lives in: House/apartment Stairs:  ramp outside Has following equipment at home: [name] - 2 wheeled, Environmental consultant - 4 wheeled, Wheelchair (power), shower chair, Ramped entry, and chair lift and hospital bed   PLOF: Independent  PATIENT GOALS: get up and walk by herself, wants to walk with a cane.   OBJECTIVE:   DIAGNOSTIC FINDINGS: CVA in [place]; no imaging in system  COGNITION: Overall cognitive status:  a little bit per granddaughter.    SENSATION: WFL  COORDINATION: Limited LLE     POSTURE: rounded shoulders, forward head, flexed trunk , and weight shift right    LOWER EXTREMITY MMT:    MMT Right Eval Left Eval  Hip flexion 4 2  Hip extension    Hip abduction 4 2+  Hip adduction 4 2+  Knee flexion 4 2-  Knee extension 4 2+  Ankle dorsiflexion 3   Ankle plantarflexion 3   (Blank rows = not tested)   TRANSFERS: Assistive device utilized: Environmental consultant - 2 wheeled  Sit to stand: CGA and Min A; blocking of L foot  Stand to sit: CGA Chair to chair: Min A    GAIT: Gait pattern: step to pattern, decreased stance time- Left, decreased ankle  dorsiflexion- Left, and poor foot clearance- Left Distance walked: 23ft Assistive device utilized: Environmental consultant - 2 wheeled Level of assistance: CGA Comments: L knee hyperextension with weightbearing  FUNCTIONAL TESTS:  5 times sit to stand: 38 seconds with SUE support  10 meter walk test: 1 min 43 seconds with RW  PATIENT SURVEYS:  FOTO 46  TODAY'S TREATMENT:                                                                                                                              DATE: 01/19/23  TE:  Amb FWD/BCKWD in // bars 1x8 half way down // bars and back. Pt rates easy  -Advanced to performing with 1.5# AW donned each LE 4x each way. Pt rates medium  LTL stepping in // bars with 1.5# AW each LE 3x length of // bars each direction. More challenging/fatiguing   Standing BUE support alt step tap onto 6" with 1.5# AW each LE 1x12, 1x6. Cuing to decrease circumduction of LLE   Seated march 10x each LE with 1.5# AW donned - very difficult -3x10 alt LE with TC and no AW. Improved AROM of LLE without weights, weights currently too challenging for LLE on this date.   STS from WC 3x5, pull to stand. Rates medium. PT provides CGA  Seated core crunch/oblique twist 3x10 each side  No pain with interventions  PATIENT EDUCATION: Education details: Pt educated  throughout session about proper posture and technique with exercises. Improved exercise technique, movement at target joints, use of target muscles after min to mod verbal, visual, tactile cues.  Person educated: Patient and granddaughter Education method: Explanation, Demonstration, Tactile cues, and Verbal cues Education comprehension: verbalized understanding, returned demonstration, verbal cues required, tactile cues required, and needs further education  HOME EXERCISE PROGRAM: Access Code: [email or web address] URL: [email or web address] Date: 12/06/2022 Prepared by: Precious Bard  Exercises - Leg Extension  - 1 x daily -  7 x weekly - 2 sets - 10 reps - 5 hold - Seated March  - 1 x daily - 7 x weekly - 2 sets - 10 reps - 5 hold - Seated Hip Abduction  - 1 x daily - 7 x weekly - 2 sets - 10 reps - 5 hold - Seated Hip Adduction Isometrics with Ball  - 1 x daily - 7 x weekly - 2 sets - 10 reps - 5 hold - Seated Weight Shifting Without Arm Support  - 1 x daily - 7 x weekly - 2 sets - 10 reps - 5 hold   GOALS: Goals reviewed with patient? Yes  SHORT TERM GOALS: Target date: 01/03/2023    Patient will be independent in home exercise program to improve strength/mobility for better functional independence with ADLs. Baseline: Goal status: INITIAL    LONG TERM GOALS: Target date: 02/28/2023    Patient will increase FOTO score to equal to or greater than 53    to demonstrate statistically significant improvement in mobility and quality of life.  Baseline: 3/5: 46% Goal status: INITIAL  2.  Patient (> 17 years old) will complete five times sit to stand test in < 15 seconds indicating an increased LE strength and improved balance. Baseline: 3/5: 38 seconds with SUE support; blocking of LLE Goal status: INITIAL  3.  Patient will increase 10 meter walk test to >1.9m/s as to improve gait speed for better community ambulation and to reduce fall risk Baseline: 3/5: 1 min 43 seconds with RW and w/c follow Goal status: INITIAL  4.  Patient will increase BLE gross strength to 4/5 as to improve functional strength for independent gait, increased standing tolerance and increased ADL ability. Baseline: 3/5: grossly 2/5 LLE  Goal status: INITIAL    ASSESSMENT:  CLINICAL IMPRESSION: Pt with excellent motivation to participate in session. She advances LE strengthening/gait activity in // bars both in terms of complexity of movement (lateral stepping) and with completing with ankle weights donned. While pt shows progress, she struggles with compensatory L hip circumduction with step-tap intervention. Will continue to  target this deficit by performing hip flexor strengthening. The patient will benefit from skilled physical therapy to improve function, mobility, and strength.  OBJECTIVE IMPAIRMENTS: Abnormal gait, cardiopulmonary status limiting activity, decreased activity tolerance, decreased balance, decreased coordination, decreased endurance, decreased knowledge of use of DME, decreased mobility, difficulty walking, decreased ROM, decreased strength, impaired perceived functional ability, impaired flexibility, impaired UE functional use, improper body mechanics, postural dysfunction, obesity, and pain.   ACTIVITY LIMITATIONS: carrying, lifting, bending, sitting, standing, squatting, sleeping, stairs, transfers, bed mobility, bathing, toileting, dressing, reach over head, and caring for others  PARTICIPATION LIMITATIONS: meal prep, cleaning, laundry, interpersonal relationship, driving, shopping, community activity, and church  PERSONAL FACTORS: Age, Behavior pattern, Education, Fitness, Past/current experiences, Profession, Sex, Time since onset of injury/illness/exacerbation, and 3+ comorbidities: DM with diabetic neuropathy, stroke, HTN, AKI, UTI  are also affecting patient's functional outcome.   REHAB POTENTIAL: Fair  length of time since CVA  CLINICAL DECISION MAKING: Evolving/moderate complexity  EVALUATION COMPLEXITY: Moderate  PLAN:  PT FREQUENCY: 2x/week  PT DURATION: 12 weeks  PLANNED INTERVENTIONS: Therapeutic exercises, Therapeutic activity, Neuromuscular re-education, Balance training, Gait training, Patient/Family education, Self Care, Joint mobilization, Stair training, Vestibular training, Canalith repositioning, Visual/preceptual remediation/compensation, Orthotic/Fit training, DME instructions, Cognitive remediation, Electrical stimulation, Wheelchair mobility training, Spinal mobilization, Cryotherapy, Moist heat, Splintting, Taping, Traction, Ultrasound, Manual therapy, and  Re-evaluation  PLAN FOR NEXT SESSION: stand in // bars, work on walking, strengthening LLE.  Baird Kay, PT 01/19/2023, 10:37 AM

## 2023-01-24 ENCOUNTER — Ambulatory Visit: Payer: 59 | Admitting: Occupational Therapy

## 2023-01-24 ENCOUNTER — Ambulatory Visit: Payer: 59

## 2023-01-24 ENCOUNTER — Encounter: Payer: Self-pay | Admitting: Occupational Therapy

## 2023-01-24 DIAGNOSIS — R2681 Unsteadiness on feet: Secondary | ICD-10-CM

## 2023-01-24 DIAGNOSIS — M79641 Pain in right hand: Secondary | ICD-10-CM

## 2023-01-24 DIAGNOSIS — R278 Other lack of coordination: Secondary | ICD-10-CM

## 2023-01-24 DIAGNOSIS — M6281 Muscle weakness (generalized): Secondary | ICD-10-CM

## 2023-01-24 DIAGNOSIS — R262 Difficulty in walking, not elsewhere classified: Secondary | ICD-10-CM

## 2023-01-24 NOTE — Therapy (Signed)
OUTPATIENT PHYSICAL THERAPY NEURO TREATMENT NOTE   Patient Name: Dana Lucas MRN: [id number] DOB:[birth year], 72 y.o., female Today's Date: 01/24/2023   PCP: Bobbye Morton MD REFERRING PROVIDER: Louis Matte MD   END OF SESSION:  PT End of Session - 01/24/23 1012     Visit Number 9    Number of Visits 24    Date for PT Re-Evaluation 02/28/23    Authorization Type 1/10 eval 3/5    PT Start Time 0932    PT Stop Time 1011    PT Time Calculation (min) 39 min    Equipment Utilized During Treatment Gait belt    Activity Tolerance Patient tolerated treatment well;No increased pain    Behavior During Therapy WFL for tasks assessed/performed                Past Medical History:  Diagnosis Date   Diabetes mellitus without complication    Hypertension    Stroke    Past Surgical History:  Procedure Laterality Date   ABDOMINAL HYSTERECTOMY     COLONOSCOPY N/A 10/06/2019   Procedure: COLONOSCOPY;  Surgeon: Toledo, Boykin Nearing, MD;  Location: [facility];  Service: Gastroenterology;  Laterality: N/A;   Patient Active Problem List   Diagnosis Date Noted   Hypertension    Diabetes mellitus without complication    Stroke    GIB (gastrointestinal bleeding)    AKI (acute kidney injury)    UTI (urinary tract infection)     ONSET DATE: 2020  REFERRING DIAG: 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   THERAPY DIAG:  Muscle weakness (generalized)  Other lack of coordination  Difficulty in walking, not elsewhere classified  Unsteadiness on feet  Pain in right hand  Rationale for Evaluation and Treatment: Rehabilitation  SUBJECTIVE:                                                                                                                                                                                             SUBJECTIVE STATEMENT: Pt reports she had a good weekend, no soreness following last appointment. She reports  no falls. No other updates, continued R hand pain. 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 Stroke was four years ago, has had multiple strokes. Had a little bit of therapy, afterwards in [place]. Moved up to [place] about three years ago. [name] includes DM with diabetic neuropathy, stroke, HTN, AKI, UTI. Uses a walker to walk to the bathroom (two wheel and three wheel walkers).  Has  a power chair for out of the house.   PAIN:  Are you having pain? Yes: NPRS scale: 4/10 Pain location: R knee pain Pain description: aching Aggravating factors: standing, moving Relieving factors: nothing known.   PRECAUTIONS: Fall  WEIGHT BEARING RESTRICTIONS: No  FALLS: Has patient fallen in last 6 months? No  LIVING ENVIRONMENT: Lives with: lives with their family Lives in: House/apartment Stairs:  ramp outside Has following equipment at home: [name] - 2 wheeled, Environmental consultant - 4 wheeled, Wheelchair (power), shower chair, Ramped entry, and chair lift and hospital bed   PLOF: Independent  PATIENT GOALS: get up and walk by herself, wants to walk with a cane.   OBJECTIVE:   DIAGNOSTIC FINDINGS: CVA in [place]; no imaging in system  COGNITION: Overall cognitive status:  a little bit per granddaughter.    SENSATION: WFL  COORDINATION: Limited LLE     POSTURE: rounded shoulders, forward head, flexed trunk , and weight shift right    LOWER EXTREMITY MMT:    MMT Right Eval Left Eval  Hip flexion 4 2  Hip extension    Hip abduction 4 2+  Hip adduction 4 2+  Knee flexion 4 2-  Knee extension 4 2+  Ankle dorsiflexion 3   Ankle plantarflexion 3   (Blank rows = not tested)   TRANSFERS: Assistive device utilized: Environmental consultant - 2 wheeled  Sit to stand: CGA and Min A; blocking of L foot  Stand to sit: CGA Chair to chair: Min A    GAIT: Gait pattern: step to pattern, decreased  stance time- Left, decreased ankle dorsiflexion- Left, and poor foot clearance- Left Distance walked: 31ft Assistive device utilized: Environmental consultant - 2 wheeled Level of assistance: CGA Comments: L knee hyperextension with weightbearing  FUNCTIONAL TESTS:  5 times sit to stand: 38 seconds with SUE support  10 meter walk test: 1 min 43 seconds with RW  PATIENT SURVEYS:  FOTO 46  TODAY'S TREATMENT:                                                                                                                              DATE: 01/24/23  TE:  Amb FWD/BCKWD and LTL in // bars half way down // bars and back. Pt rates Medium  --repeated with 1.5# AW each LE. Pt rates medium   Standing BUE support alt step tap onto 6" with 1.5# AW each LE 2x12. Cuing to decrease circumduction of LLE. Pt rates hard   Seated march 2x10 each LE with 1.5# AW donned - very difficult -LLE only (no AW) 4x10   LAQ 1x15 each LE, 1x15 RLE only with 1.5# AW each LE -- no AW LLE 1x10 - attempted one additional set but LLE too fatigued to complete   STS from WC 2x5, 1x6, pull to stand. Rates easy   Seated oblique twists 15x   No increased pain with interventions  PATIENT EDUCATION: Education details: Pt educated throughout session about  proper posture and technique with exercises. Improved exercise technique, movement at target joints, use of target muscles after min to mod verbal, visual, tactile cues.  Person educated: Patient and granddaughter Education method: Explanation, Demonstration, Tactile cues, and Verbal cues Education comprehension: verbalized understanding, returned demonstration, verbal cues required, tactile cues required, and needs further education  HOME EXERCISE PROGRAM: Access Code: [email or web address] URL: [email or web address] Date: 12/06/2022 Prepared by: Precious Bard  Exercises - Leg Extension  - 1 x daily - 7 x weekly - 2 sets - 10 reps - 5 hold - Seated March  - 1 x daily - 7 x  weekly - 2 sets - 10 reps - 5 hold - Seated Hip Abduction  - 1 x daily - 7 x weekly - 2 sets - 10 reps - 5 hold - Seated Hip Adduction Isometrics with Ball  - 1 x daily - 7 x weekly - 2 sets - 10 reps - 5 hold - Seated Weight Shifting Without Arm Support  - 1 x daily - 7 x weekly - 2 sets - 10 reps - 5 hold   GOALS: Goals reviewed with patient? Yes  SHORT TERM GOALS: Target date: 01/03/2023    Patient will be independent in home exercise program to improve strength/mobility for better functional independence with ADLs. Baseline: Goal status: INITIAL    LONG TERM GOALS: Target date: 02/28/2023    Patient will increase FOTO score to equal to or greater than 53    to demonstrate statistically significant improvement in mobility and quality of life.  Baseline: 3/5: 46% Goal status: INITIAL  2.  Patient (> 64 years old) will complete five times sit to stand test in < 15 seconds indicating an increased LE strength and improved balance. Baseline: 3/5: 38 seconds with SUE support; blocking of LLE Goal status: INITIAL  3.  Patient will increase 10 meter walk test to >1.60m/s as to improve gait speed for better community ambulation and to reduce fall risk Baseline: 3/5: 1 min 43 seconds with RW and w/c follow Goal status: INITIAL  4.  Patient will increase BLE gross strength to 4/5 as to improve functional strength for independent gait, increased standing tolerance and increased ADL ability. Baseline: 3/5: grossly 2/5 LLE  Goal status: INITIAL    ASSESSMENT:  CLINICAL IMPRESSION: Continued focus on strengthening gait activities performed in // bars. Pt tolerates these well and is able to advance interventions by completing increased reps. While pt shows progress, she still exhibits significant impairment with LLE quad and hip flexor strength. The patient will benefit from skilled physical therapy to improve function, mobility, and strength.  OBJECTIVE IMPAIRMENTS: Abnormal gait,  cardiopulmonary status limiting activity, decreased activity tolerance, decreased balance, decreased coordination, decreased endurance, decreased knowledge of use of DME, decreased mobility, difficulty walking, decreased ROM, decreased strength, impaired perceived functional ability, impaired flexibility, impaired UE functional use, improper body mechanics, postural dysfunction, obesity, and pain.   ACTIVITY LIMITATIONS: carrying, lifting, bending, sitting, standing, squatting, sleeping, stairs, transfers, bed mobility, bathing, toileting, dressing, reach over head, and caring for others  PARTICIPATION LIMITATIONS: meal prep, cleaning, laundry, interpersonal relationship, driving, shopping, community activity, and church  PERSONAL FACTORS: Age, Behavior pattern, Education, Fitness, Past/current experiences, Profession, Sex, Time since onset of injury/illness/exacerbation, and 3+ comorbidities: DM with diabetic neuropathy, stroke, HTN, AKI, UTI  are also affecting patient's functional outcome.   REHAB POTENTIAL: Fair length of time since CVA  CLINICAL DECISION MAKING: Evolving/moderate complexity  EVALUATION COMPLEXITY: Moderate  PLAN:  PT FREQUENCY:  2x/week  PT DURATION: 12 weeks  PLANNED INTERVENTIONS: Therapeutic exercises, Therapeutic activity, Neuromuscular re-education, Balance training, Gait training, Patient/Family education, Self Care, Joint mobilization, Stair training, Vestibular training, Canalith repositioning, Visual/preceptual remediation/compensation, Orthotic/Fit training, DME instructions, Cognitive remediation, Electrical stimulation, Wheelchair mobility training, Spinal mobilization, Cryotherapy, Moist heat, Splintting, Taping, Traction, Ultrasound, Manual therapy, and Re-evaluation  PLAN FOR NEXT SESSION: stand in // bars, work on walking, strengthening LLE.  Baird Kay, PT 01/24/2023, 10:13 AM

## 2023-01-24 NOTE — Therapy (Signed)
OUTPATIENT OCCUPATIONAL THERAPY NEURO TREATMENT NOTE  Patient Name: Dana Lucas MRN: [id number] DOB:1951/06/08, 72 y.o., female   PCP: Hinda Lenis, MD REFERRING PROVIDER: Sumner Boast, MD  END OF SESSION:  OT End of Session - 01/24/23 1016     Visit Number 8    Number of Visits 24    Date for OT Re-Evaluation 03/07/23    OT Start Time 1015    OT Stop Time 1059    OT Time Calculation (min) 44 min    Behavior During Therapy [facility] for tasks assessed/performed             Past Medical History:  Diagnosis Date   Diabetes mellitus without complication    Hypertension    Stroke    Past Surgical History:  Procedure Laterality Date   ABDOMINAL HYSTERECTOMY     COLONOSCOPY N/A 10/06/2019   Procedure: COLONOSCOPY;  Surgeon: Toledo, Boykin Nearing, MD;  Location: [facility] ENDOSCOPY;  Service: Gastroenterology;  Laterality: N/A;   Patient Active Problem List   Diagnosis Date Noted   Hypertension    Diabetes mellitus without complication    Stroke    GIB (gastrointestinal bleeding)    AKI (acute kidney injury)    UTI (urinary tract infection)     ONSET DATE: 05/2014  REFERRING DIAG: CVA  THERAPY DIAG:  Muscle weakness (generalized)  Other lack of coordination  Pain in right hand  Rationale for Evaluation and Treatment: Rehabilitation  SUBJECTIVE:   SUBJECTIVE STATEMENT: Pt reports doing well today.  Pt acknowledged being less drowsy today but stated that she just doesn't sleep well at night time.    Pt accompanied by: family member grand daughter  PERTINENT HISTORY: Pt. is a 72 y.o. female who has a history of multiple CVAs beginning in 55. Pt. 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   PRECAUTIONS: None  WEIGHT BEARING RESTRICTIONS: No  PAIN:  Are you having pain? 9/10 right hand arthritic pain reported beginning of session, but reduced to 3/10 pain by end of session with use of moist heat to this hand for duration of  session while focus was on the LUE today.  FALLS: Has patient fallen in last 6 months? No  LIVING ENVIRONMENT: Lives with: lives with their family and lives with their son Lives in: House/apartment Stairs: one story, ramped entrance Has following equipment at home: Wheelchair (power) and Wheelchair (manual), 2 wheeled walker, 3 wheeled walker, power lift, hospital bed, transfer shower bench  PLOF: Independent  PATIENT GOALS: To be able to walk again, and to drive  OBJECTIVE:   HAND DOMINANCE: Right  ADLs:  Transfers/ambulation related to ADLs: Eating: Independent Grooming: Independent UB Dressing: Independent LB Dressing: Independent pants, MaxA shoes, and socks Toileting: Assist with transfers, reports independence with toileting care Bathing: Assist from DIL Tub Shower transfers: Min-ModA shower transfers Equipment: See above  IADLs: Shopping: DIL performs shopping Light housekeeping: Unable Meal Prep: Pt. Is able to retrieve a beverage, or snack, light meal prep Community mobility: Power w/c Medication management: Grand daughter assists with pillbox set-up.  Pt. Independently takes medication Financial management: No changes Handwriting: Name: 100% legible in printed form, 75% in cursive form  MOBILITY STATUS: Needs Assist: Uses power w/c  POSTURE COMMENTS:  Sitting balance: WFL supported   FUNCTIONAL OUTCOME MEASURES: FOTO: 33 TR:39  UPPER EXTREMITY ROM:    Active ROM Right WFL Left eval  Shoulder flexion  0(93)  Shoulder abduction  30(83)  Shoulder adduction    Shoulder  extension    Shoulder internal rotation    Shoulder external rotation    Elbow flexion    Elbow extension  0-87(0-122)  Wrist flexion  52  Wrist extension  10(50)  Wrist ulnar deviation    Wrist radial deviation    Wrist pronation    Wrist supination    (Blank rows = not tested)  Full Digit flexion to the [facility] Active digit extension through 75% of the range grossly Active  thumb radial, and palmar abduction  UPPER EXTREMITY MMT:     MMT Right eval Left eval  Shoulder flexion  0/5  Shoulder abduction  2-/5  Shoulder adduction    Shoulder extension    Shoulder internal rotation    Shoulder external rotation    Middle trapezius    Lower trapezius    Elbow flexion  3/5  Elbow extension    Wrist flexion    Wrist extension  2/5  Wrist ulnar deviation    Wrist radial deviation    Wrist pronation    Wrist supination    (Blank rows = not tested)  HAND FUNCTION: Grip strength: Right: 18 lbs; Left: 10 lbs, Lateral pinch: Right: 5 lbs, Left: 11 lbs, and 3 point pinch: Right: 1 lbs, Left: 15 lbs  COORDINATION: Eval: N/A  SENSATION: WFL  EDEMA: TBD  MUSCLE TONE: LUE: Hypotonic  COGNITION: Overall cognitive status: Within functional limits for tasks assessed  VISION: Subjective report:  Pt. Reports having an Eye appointment Baseline vision: Wears glasses all the time  VISION ASSESSMENT: To be further assessed in functional context  PRAXIS: Impaired: Motor planning  TODAY'S TREATMENT:   Moist heat applied to R hand for pain reduction/muscle relaxation used for most of session while pt participated in LUE strengthening exercises.  Pt reports improvement in toileting skills and managing clothing over the last few weeks.  No pain in left UE, some pain in right hand from arthritis.   Therapeutic Exercise:   Pt seen this date for PROM of left UE for shoulder flexion, ABD, ADD, ER, elbow flexion/extension, forearm supination/pronation, wrist flexion/extension and digit flexion/extension followed by AAROM of all motions within available ranges.  Pt engaging in tabletop AAROM for forwards flexion, ABD, ADD with guiding from therapist.   Neuromuscular reeducation:  Pt performing active reaching from lap to tabletop with guiding from therapist.  Pt performing picking up of medium pegs (placed in upright standing position on table)  and placing into  large grid, able to perform 3-5 reps then requiring additional assistance and guiding.  Moderate cues for release of objects to place in grid and when removing to place back into container.  Increased left thumb movement noted this date.                 PATIENT EDUCATION: Education details: LUE strengthening exercises Person educated: Patient Education method: Explanation, Demonstration, and Verbal cues Education comprehension: needs further education  HOME EXERCISE PROGRAM: Towel squeezes grip strengthening, seated marches and knee extension (lifting legs over tub)  GOALS: Goals reviewed with patient? Yes  SHORT TERM GOALS: Target date: 01/24/2023   Pt. Will demonstrate independence with HEPs for the LUE. Baseline: Eval: No current HEP Goal status: INITIAL  LONG TERM GOALS: Target date: 03/07/2023  Pt. Will increase FOTO score by 2 points for Pt. perceived improvement with assessment specific ADL/IADL improvement. Baseline: Eval: FOTO 33, TR score: 39 Goal status: INITIAL  2.  Pt. Will improve right shoulder ROM by 10 degrees to assist  with self-dressing. Baseline: Left shoulder flexion: 0(93), Abduction: 30(83) Goal status: INITIAL  3.  Pt. Will improve left wrist extension by 10 in preparation for functional reaching Baseline: Left wrist extension: 10(50) Goal status: INITIAL  4.  Pt. Will left grip strength by 5# in preparation for being able to securely stabilize ADL/IADL items at the tabletop. Baseline: Left grip strength: 10# Goal status: INITIAL  5. Pt will perform tub transfer with leg lifter and transfer tub bench with supv/set up.  Baseline: Min A with sit<>stand from tub bench, mod A to lift L leg over tub.  Goal status: NEW  6. Pt will perform UB bathing with min A.  Baseline: Mod A  Goal status: New  ASSESSMENT:  CLINICAL IMPRESSION: Pt demonstrating good progress overall and demonstrating increased reaching and grasping patterns since seeing this  therapist in the past.   Increased thumb motion on left noted with thumb ABD.  She benefits from therapist guiding with reaching patterns and cues/facilitation for release of objects.  Pt requires short rest breaks during task and then performs 3-5 reps without assist before requiring therapist guiding.  She tends to elevate left shoulder demonstrating compensatory movements with active reaching patterns.  Continue OT to facilitate increased active movement of left UE to improve independence in necessary daily tasks.      PERFORMANCE DEFICITS: in functional skills including ADLs, IADLs, coordination, dexterity, ROM, strength, pain, Fine motor control, and Gross motor control, cognitive skills including attention, problem solving, and safety awareness, and psychosocial skills including coping strategies, environmental adaptation, and routines and behaviors.   IMPAIRMENTS: are limiting patient from ADLs, IADLs, leisure, and social participation.   CO-MORBIDITIES: may have co-morbidities  that affects occupational performance. Patient will benefit from skilled OT to address above impairments and improve overall function.  MODIFICATION OR ASSISTANCE TO COMPLETE EVALUATION: Maximum or significant modification of tasks or assist is necessary to complete an evaluation.  OT OCCUPATIONAL PROFILE AND HISTORY: Comprehensive assessment: Review of records and extensive additional review of physical, cognitive, psychosocial history related to current functional performance.  CLINICAL DECISION MAKING: High - multiple treatment options, significant modification of task necessary  REHAB POTENTIAL: Good  EVALUATION COMPLEXITY: High   PLAN:  OT FREQUENCY: 2x/week  OT DURATION: 12 weeks  PLANNED INTERVENTIONS: self care/ADL training, therapeutic exercise, therapeutic activity, neuromuscular re-education, manual therapy, passive range of motion, functional mobility training, electrical stimulation, and  paraffin  RECOMMENDED OTHER SERVICES: OT  CONSULTED AND AGREED WITH PLAN OF CARE: Patient  PLAN FOR NEXT SESSION: therapeutic exercises, ADL/AE training   Danaysia Rader T Shaqueena Mauceri, OTR/L, CLT  01/25/2023, 10:01 AM

## 2023-01-26 ENCOUNTER — Ambulatory Visit: Payer: 59

## 2023-01-26 DIAGNOSIS — R262 Difficulty in walking, not elsewhere classified: Secondary | ICD-10-CM

## 2023-01-26 DIAGNOSIS — R278 Other lack of coordination: Secondary | ICD-10-CM

## 2023-01-26 DIAGNOSIS — M6281 Muscle weakness (generalized): Secondary | ICD-10-CM

## 2023-01-26 DIAGNOSIS — R2681 Unsteadiness on feet: Secondary | ICD-10-CM

## 2023-01-26 NOTE — Therapy (Signed)
OUTPATIENT PHYSICAL THERAPY NEURO TREATMENT NOTE/Physical Therapy Progress Note   Dates of reporting period  12/06/2022   to   01/26/2023    Patient Name: Dana Lucas MRN: [id number] DOB:1951/09/10, 72 y.o., female Today's Date: 01/26/2023   PCP: Bobbye Morton MD REFERRING PROVIDER: Louis Matte MD   END OF SESSION:  PT End of Session - 01/26/23 0928     Visit Number 10    Number of Visits 24    Date for PT Re-Evaluation 02/28/23    Authorization Type 1/10 eval 3/5    PT Start Time 0931    PT Stop Time 1012    PT Time Calculation (min) 41 min    Equipment Utilized During Treatment Gait belt    Activity Tolerance Patient tolerated treatment well;No increased pain    Behavior During Therapy WFL for tasks assessed/performed                Past Medical History:  Diagnosis Date   Diabetes mellitus without complication    Hypertension    Stroke    Past Surgical History:  Procedure Laterality Date   ABDOMINAL HYSTERECTOMY     COLONOSCOPY N/A 10/06/2019   Procedure: COLONOSCOPY;  Surgeon: Toledo, Boykin Nearing, MD;  Location: [facility] ENDOSCOPY;  Service: Gastroenterology;  Laterality: N/A;   Patient Active Problem List   Diagnosis Date Noted   Hypertension    Diabetes mellitus without complication    Stroke    GIB (gastrointestinal bleeding)    AKI (acute kidney injury)    UTI (urinary tract infection)     ONSET DATE: 2020  REFERRING DIAG: 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   THERAPY DIAG:  Muscle weakness (generalized)  Other lack of coordination  Difficulty in walking, not elsewhere classified  Unsteadiness on feet  Rationale for Evaluation and Treatment: Rehabilitation  SUBJECTIVE:                                                                                                                                                                                             SUBJECTIVE STATEMENT: Pt reports her  hand is feeling a little better today. Pt reports she slept well after last appointment, reports no soreness, no stumbles/falls. 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 Stroke was four years ago, has had multiple strokes. Had a little bit of therapy, afterwards in [place]. Moved up to [place] about three years ago. [name] includes DM with diabetic neuropathy, stroke, HTN, AKI, UTI. Uses a walker  to walk to the bathroom (two wheel and three wheel walkers).  Has a power chair for out of the house.   PAIN:  Are you having pain? Yes: NPRS scale: 4/10 Pain location: R knee pain Pain description: aching Aggravating factors: standing, moving Relieving factors: nothing known.   PRECAUTIONS: Fall  WEIGHT BEARING RESTRICTIONS: No  FALLS: Has patient fallen in last 6 months? No  LIVING ENVIRONMENT: Lives with: lives with their family Lives in: House/apartment Stairs:  ramp outside Has following equipment at home: [name] - 2 wheeled, Environmental consultant - 4 wheeled, Wheelchair (power), shower chair, Ramped entry, and chair lift and hospital bed   PLOF: Independent  PATIENT GOALS: get up and walk by herself, wants to walk with a cane.   OBJECTIVE:   DIAGNOSTIC FINDINGS: CVA in [place]; no imaging in system  COGNITION: Overall cognitive status:  a little bit per granddaughter.    SENSATION: WFL  COORDINATION: Limited LLE     POSTURE: rounded shoulders, forward head, flexed trunk , and weight shift right    LOWER EXTREMITY MMT:    MMT Right Eval Left Eval  Hip flexion 4 2  Hip extension    Hip abduction 4 2+  Hip adduction 4 2+  Knee flexion 4 2-  Knee extension 4 2+  Ankle dorsiflexion 3   Ankle plantarflexion 3   (Blank rows = not tested)   TRANSFERS: Assistive device utilized: Environmental consultant - 2 wheeled  Sit to stand: CGA and Min A; blocking of L foot  Stand to sit:  CGA Chair to chair: Min A    GAIT: Gait pattern: step to pattern, decreased stance time- Left, decreased ankle dorsiflexion- Left, and poor foot clearance- Left Distance walked: 73ft Assistive device utilized: Environmental consultant - 2 wheeled Level of assistance: CGA Comments: L knee hyperextension with weightbearing  FUNCTIONAL TESTS:  5 times sit to stand: 38 seconds with SUE support  10 meter walk test: 1 min 43 seconds with RW  PATIENT SURVEYS:  FOTO 46  TODAY'S TREATMENT:                                                                                                                              DATE: 01/26/23  TA: Goal retesting completed on this date. Refer to goal section below for details. Pt provided education on testing and indications of performance throughout.   TE:  STS 2x5. Rates medium  With 2.5# AW donned each LE: Amb FWD/BCKWD 2x4 and LTL 1x4 in // bars half way down // bars and back. Pt rates difficult  2.5# RLE no weight LLE seated march 3x10 each LE   Core isometric at matrix cable machine with  2.5# x 60 sec. Some difficulty with technique.  No pain with interventions.  PATIENT EDUCATION: Education details: Pt educated throughout session about proper posture and technique with exercises. Improved exercise technique, movement at target joints, use of target muscles after min to mod verbal, visual,  tactile cues. Goal retesting, indications   Person educated: Patient and granddaughter Education method: Explanation, Demonstration, Tactile cues, and Verbal cues Education comprehension: verbalized understanding, returned demonstration, verbal cues required, tactile cues required, and needs further education  HOME EXERCISE PROGRAM: Access Code: [email or web address] URL: [email or web address] Date: 12/06/2022 Prepared by: Precious Bard  Exercises - Leg Extension  - 1 x daily - 7 x weekly - 2 sets - 10 reps - 5 hold - Seated March  - 1 x daily - 7 x weekly - 2 sets  - 10 reps - 5 hold - Seated Hip Abduction  - 1 x daily - 7 x weekly - 2 sets - 10 reps - 5 hold - Seated Hip Adduction Isometrics with Ball  - 1 x daily - 7 x weekly - 2 sets - 10 reps - 5 hold - Seated Weight Shifting Without Arm Support  - 1 x daily - 7 x weekly - 2 sets - 10 reps - 5 hold   GOALS: Goals reviewed with patient? Yes  SHORT TERM GOALS: Target date: 01/03/2023    Patient will be independent in home exercise program to improve strength/mobility for better functional independence with ADLs. Baseline: 01/26/2023: pt has some difficulty with HEP Goal status: IN PROGRESS    LONG TERM GOALS: Target date: 02/28/2023    Patient will increase FOTO score to equal to or greater than 53    to demonstrate statistically significant improvement in mobility and quality of life.  Baseline: 3/5: 46%; 01/26/23: 45%  Goal status: IN PROGRESS  2.  Patient (> 63 years old) will complete five times sit to stand test in < 15 seconds indicating an increased LE strength and improved balance. Baseline: 3/5: 38 seconds with SUE support; blocking of LLE; 01/26/23 18 seconds with pulling to stand Goal status: IN PROGRESS  3.  Patient will increase 10 meter walk test to >1.79m/s as to improve gait speed for better community ambulation and to reduce fall risk Baseline: 3/5: 1 min 43 seconds with RW and w/c follow; 01/26/2023: 2 min 6 seconds  Goal status: IN PROGRESS  4.  Patient will increase BLE gross strength to 4/5 as to improve functional strength for independent gait, increased standing tolerance and increased ADL ability. Baseline: 3/5: grossly 2/5 LLE; 4//25/24: RLE grossly 4-/5, LLE grossly 2/5  Goal status: IN PROGRESS    ASSESSMENT:  CLINICAL IMPRESSION: Goal retesting completed on this date for progress note. Pt making gains on 5xSTS and MMT goals, indicating improved BLE strength/power and slight decrease in fall risk. While pt making some gains, she had 1 point decrease on FOTO and  decreased performance on . Patient's condition has the potential to improve in response to therapy. Maximum improvement is yet to be obtained. The anticipated improvement is attainable and reasonable in a generally predictable time.    The patient will benefit from skilled physical therapy to improve function, mobility, and strength.  OBJECTIVE IMPAIRMENTS: Abnormal gait, cardiopulmonary status limiting activity, decreased activity tolerance, decreased balance, decreased coordination, decreased endurance, decreased knowledge of use of DME, decreased mobility, difficulty walking, decreased ROM, decreased strength, impaired perceived functional ability, impaired flexibility, impaired UE functional use, improper body mechanics, postural dysfunction, obesity, and pain.   ACTIVITY LIMITATIONS: carrying, lifting, bending, sitting, standing, squatting, sleeping, stairs, transfers, bed mobility, bathing, toileting, dressing, reach over head, and caring for others  PARTICIPATION LIMITATIONS: meal prep, cleaning, laundry, interpersonal relationship, driving, shopping, community activity, and church  PERSONAL FACTORS: Age, Behavior pattern, Education,  Fitness, Past/current experiences, Profession, Sex, Time since onset of injury/illness/exacerbation, and 3+ comorbidities: DM with diabetic neuropathy, stroke, HTN, AKI, UTI  are also affecting patient's functional outcome.   REHAB POTENTIAL: Fair length of time since CVA  CLINICAL DECISION MAKING: Evolving/moderate complexity  EVALUATION COMPLEXITY: Moderate  PLAN:  PT FREQUENCY: 2x/week  PT DURATION: 12 weeks  PLANNED INTERVENTIONS: Therapeutic exercises, Therapeutic activity, Neuromuscular re-education, Balance training, Gait training, Patient/Family education, Self Care, Joint mobilization, Stair training, Vestibular training, Canalith repositioning, Visual/preceptual remediation/compensation, Orthotic/Fit training, DME instructions, Cognitive  remediation, Electrical stimulation, Wheelchair mobility training, Spinal mobilization, Cryotherapy, Moist heat, Splintting, Taping, Traction, Ultrasound, Manual therapy, and Re-evaluation  PLAN FOR NEXT SESSION: stand in // bars, work on walking, strengthening LLE.  Baird Kay, PT 01/26/2023, 11:33 AM

## 2023-01-31 ENCOUNTER — Ambulatory Visit: Payer: 59

## 2023-01-31 DIAGNOSIS — R278 Other lack of coordination: Secondary | ICD-10-CM

## 2023-01-31 DIAGNOSIS — M79641 Pain in right hand: Secondary | ICD-10-CM

## 2023-01-31 DIAGNOSIS — M6281 Muscle weakness (generalized): Secondary | ICD-10-CM

## 2023-01-31 DIAGNOSIS — R2681 Unsteadiness on feet: Secondary | ICD-10-CM

## 2023-01-31 DIAGNOSIS — R262 Difficulty in walking, not elsewhere classified: Secondary | ICD-10-CM

## 2023-01-31 NOTE — Therapy (Signed)
OUTPATIENT PHYSICAL THERAPY NEURO TREATMENT NOTE    Patient Name: Dana Lucas MRN: [id number] DOB:08/05/1951, 72 y.o., female Today's Date: 01/31/2023   PCP: Bobbye Morton MD REFERRING PROVIDER: Louis Matte MD   END OF SESSION:  PT End of Session - 01/31/23 1203     Visit Number 11    Number of Visits 24    Date for PT Re-Evaluation 02/28/23    Authorization Type 1/10 eval 3/5    PT Start Time 0933    PT Stop Time 1014    PT Time Calculation (min) 41 min    Equipment Utilized During Treatment Gait belt    Activity Tolerance Patient tolerated treatment well;No increased pain    Behavior During Therapy WFL for tasks assessed/performed                 Past Medical History:  Diagnosis Date   Diabetes mellitus without complication (HCC)    Hypertension    Stroke [facility])    Past Surgical History:  Procedure Laterality Date   ABDOMINAL HYSTERECTOMY     COLONOSCOPY N/A 10/06/2019   Procedure: COLONOSCOPY;  Surgeon: Toledo, Boykin Nearing, MD;  Location: [facility] ENDOSCOPY;  Service: Gastroenterology;  Laterality: N/A;   Patient Active Problem List   Diagnosis Date Noted   Hypertension    Diabetes mellitus without complication (HCC)    Stroke (HCC)    GIB (gastrointestinal bleeding)    AKI (acute kidney injury) (HCC)    UTI (urinary tract infection)     ONSET DATE: 2020  REFERRING DIAG: 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   THERAPY DIAG:  Muscle weakness (generalized)  Unsteadiness on feet  Difficulty in walking, not elsewhere classified  Other lack of coordination  Pain in right hand  Rationale for Evaluation and Treatment: Rehabilitation  SUBJECTIVE:                                                                                                                                                                                             SUBJECTIVE STATEMENT: Pt reports hand still hurting. No other concerns or  updates. HEP going well at home. 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 Stroke was four years ago, has had multiple strokes. Had a little bit of therapy, afterwards in [place]. Moved up to [place] about three years ago. [name] includes DM with diabetic neuropathy, stroke, HTN, AKI, UTI. Uses a walker to walk to the bathroom (two wheel and three wheel walkers).  Has a  power chair for out of the house.   PAIN:  Are you having pain? Yes: NPRS scale: 4/10 Pain location: R knee pain Pain description: aching Aggravating factors: standing, moving Relieving factors: nothing known.   PRECAUTIONS: Fall  WEIGHT BEARING RESTRICTIONS: No  FALLS: Has patient fallen in last 6 months? No  LIVING ENVIRONMENT: Lives with: lives with their family Lives in: House/apartment Stairs:  ramp outside Has following equipment at home: [name] - 2 wheeled, Environmental consultant - 4 wheeled, Wheelchair (power), shower chair, Ramped entry, and chair lift and hospital bed   PLOF: Independent  PATIENT GOALS: get up and walk by herself, wants to walk with a cane.   OBJECTIVE:   DIAGNOSTIC FINDINGS: CVA in [place]; no imaging in system  COGNITION: Overall cognitive status:  a little bit per granddaughter.    SENSATION: WFL  COORDINATION: Limited LLE     POSTURE: rounded shoulders, forward head, flexed trunk , and weight shift right    LOWER EXTREMITY MMT:    MMT Right Eval Left Eval  Hip flexion 4 2  Hip extension    Hip abduction 4 2+  Hip adduction 4 2+  Knee flexion 4 2-  Knee extension 4 2+  Ankle dorsiflexion 3   Ankle plantarflexion 3   (Blank rows = not tested)   TRANSFERS: Assistive device utilized: Environmental consultant - 2 wheeled  Sit to stand: CGA and Min A; blocking of L foot  Stand to sit: CGA Chair to chair: Min A    GAIT: Gait pattern: step to pattern, decreased stance time-  Left, decreased ankle dorsiflexion- Left, and poor foot clearance- Left Distance walked: 66ft Assistive device utilized: Environmental consultant - 2 wheeled Level of assistance: CGA Comments: L knee hyperextension with weightbearing  FUNCTIONAL TESTS:  5 times sit to stand: 38 seconds with SUE support  10 meter walk test: 1 min 43 seconds with RW  PATIENT SURVEYS:  FOTO 46  TODAY'S TREATMENT:                                                                                                                              DATE: 01/31/23   TE:  1.5# AW donned each LE: Ambulation FWD and with turns in // bars x several minutes, BUE support. Pt rates medium  Standing LTL weight shifts with BUE support 10x Standing BUE on support surface: standing marches, side stepping 2 rounds of each of approx 10 reps each LE. Pt rates medium  STS 7x (mix of push and pull to stand technique)  Pt then completes 2x7 additional sets/reps   Pt takes bathroom break x 4 minutes  No AW, seated: LAQ 15x each LE with assist to RLE, TC provided  Seated soccer ball kicks focus RLE  (kicking to PT and kicking over cones)  x multiple reps Seated march each LE 3x15  Seated core crunch with oblique twist 10x each side  No pain with interventions.  PATIENT EDUCATION: Education  details: Pt educated throughout session about proper posture and technique with exercises. Improved exercise technique, movement at target joints, use of target muscles after min to mod verbal, visual, tactile cues. Goal retesting, indications   Person educated: Patient and granddaughter Education method: Explanation, Demonstration, Tactile cues, and Verbal cues Education comprehension: verbalized understanding, returned demonstration, verbal cues required, tactile cues required, and needs further education  HOME EXERCISE PROGRAM: Access Code: [email or web address] URL: [email or web address] Date: 12/06/2022 Prepared by: Precious Bard  Exercises - Leg  Extension  - 1 x daily - 7 x weekly - 2 sets - 10 reps - 5 hold - Seated March  - 1 x daily - 7 x weekly - 2 sets - 10 reps - 5 hold - Seated Hip Abduction  - 1 x daily - 7 x weekly - 2 sets - 10 reps - 5 hold - Seated Hip Adduction Isometrics with Ball  - 1 x daily - 7 x weekly - 2 sets - 10 reps - 5 hold - Seated Weight Shifting Without Arm Support  - 1 x daily - 7 x weekly - 2 sets - 10 reps - 5 hold   GOALS: Goals reviewed with patient? Yes  SHORT TERM GOALS: Target date: 01/03/2023    Patient will be independent in home exercise program to improve strength/mobility for better functional independence with ADLs. Baseline: 01/26/2023: pt has some difficulty with HEP Goal status: IN PROGRESS    LONG TERM GOALS: Target date: 02/28/2023    Patient will increase FOTO score to equal to or greater than 53    to demonstrate statistically significant improvement in mobility and quality of life.  Baseline: 3/5: 46%; 01/26/23: 45%  Goal status: IN PROGRESS  2.  Patient (> 41 years old) will complete five times sit to stand test in < 15 seconds indicating an increased LE strength and improved balance. Baseline: 3/5: 38 seconds with SUE support; blocking of LLE; 01/26/23 18 seconds with pulling to stand Goal status: IN PROGRESS  3.  Patient will increase 10 meter walk test to >1.28m/s as to improve gait speed for better community ambulation and to reduce fall risk Baseline: 3/5: 1 min 43 seconds with RW and w/c follow; 01/26/2023: 2 min 6 seconds  Goal status: IN PROGRESS  4.  Patient will increase BLE gross strength to 4/5 as to improve functional strength for independent gait, increased standing tolerance and increased ADL ability. Baseline: 3/5: grossly 2/5 LLE; 4//25/24: RLE grossly 4-/5, LLE grossly 2/5  Goal status: IN PROGRESS    ASSESSMENT:  CLINICAL IMPRESSION: Pt able to advance to completing more standing therex, indicating improved activity tolerance and LE strength. While pt is  making progress, she fatigued quickly requiring rest breaks throughout. The patient will benefit from skilled physical therapy to improve function, mobility, and strength.  OBJECTIVE IMPAIRMENTS: Abnormal gait, cardiopulmonary status limiting activity, decreased activity tolerance, decreased balance, decreased coordination, decreased endurance, decreased knowledge of use of DME, decreased mobility, difficulty walking, decreased ROM, decreased strength, impaired perceived functional ability, impaired flexibility, impaired UE functional use, improper body mechanics, postural dysfunction, obesity, and pain.   ACTIVITY LIMITATIONS: carrying, lifting, bending, sitting, standing, squatting, sleeping, stairs, transfers, bed mobility, bathing, toileting, dressing, reach over head, and caring for others  PARTICIPATION LIMITATIONS: meal prep, cleaning, laundry, interpersonal relationship, driving, shopping, community activity, and church  PERSONAL FACTORS: Age, Behavior pattern, Education, Fitness, Past/current experiences, Profession, Sex, Time since onset of injury/illness/exacerbation, and 3+ comorbidities: DM with diabetic neuropathy, stroke, HTN, AKI,  UTI  are also affecting patient's functional outcome.   REHAB POTENTIAL: Fair length of time since CVA  CLINICAL DECISION MAKING: Evolving/moderate complexity  EVALUATION COMPLEXITY: Moderate  PLAN:  PT FREQUENCY: 2x/week  PT DURATION: 12 weeks  PLANNED INTERVENTIONS: Therapeutic exercises, Therapeutic activity, Neuromuscular re-education, Balance training, Gait training, Patient/Family education, Self Care, Joint mobilization, Stair training, Vestibular training, Canalith repositioning, Visual/preceptual remediation/compensation, Orthotic/Fit training, DME instructions, Cognitive remediation, Electrical stimulation, Wheelchair mobility training, Spinal mobilization, Cryotherapy, Moist heat, Splintting, Taping, Traction, Ultrasound, Manual therapy, and  Re-evaluation  PLAN FOR NEXT SESSION: stand in // bars, work on walking, strengthening LLE.  Baird Kay, PT 01/31/2023, 12:08 PM

## 2023-01-31 NOTE — Therapy (Signed)
OUTPATIENT OCCUPATIONAL THERAPY NEURO TREATMENT NOTE  Patient Name: Dana Lucas MRN: [id number] DOB:Jul 17, 1951, 72 y.o., female   PCP: Hinda Lenis, MD REFERRING PROVIDER: Sumner Boast, MD  END OF SESSION:  OT End of Session - 01/31/23 1256     Visit Number 9    Number of Visits 24    Date for OT Re-Evaluation 03/07/23    Authorization Type Progress report period starting 12/13/2022    Progress Note Due on Visit 10    OT Start Time 1015    OT Stop Time 1100    OT Time Calculation (min) 45 min    Activity Tolerance Patient tolerated treatment well    Behavior During Therapy [facility] for tasks assessed/performed             Past Medical History:  Diagnosis Date   Diabetes mellitus without complication (HCC)    Hypertension    Stroke [facility])    Past Surgical History:  Procedure Laterality Date   ABDOMINAL HYSTERECTOMY     COLONOSCOPY N/A 10/06/2019   Procedure: COLONOSCOPY;  Surgeon: Toledo, Boykin Nearing, MD;  Location: [facility] ENDOSCOPY;  Service: Gastroenterology;  Laterality: N/A;   Patient Active Problem List   Diagnosis Date Noted   Hypertension    Diabetes mellitus without complication (HCC)    Stroke (HCC)    GIB (gastrointestinal bleeding)    AKI (acute kidney injury) (HCC)    UTI (urinary tract infection)     ONSET DATE: 05/2014  REFERRING DIAG: CVA  THERAPY DIAG:  Muscle weakness (generalized)  Other lack of coordination  Rationale for Evaluation and Treatment: Rehabilitation  SUBJECTIVE:   SUBJECTIVE STATEMENT: Pt reports R hand not feeling too bad today (4/10 pain), but pt feeling tired.  Pt continues to report that she doesn't sleep well at night as she has to get up every 2 hours to urinate.     Pt accompanied by: family member grand daughter  PERTINENT HISTORY: Pt. is a 72 y.o. female who has a history of multiple CVAs beginning in 14. Pt. 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   PRECAUTIONS:  None  WEIGHT BEARING RESTRICTIONS: No  PAIN:  Are you having pain? 4/10 right hand arthritic pain; improves with moist heat during session FALLS: Has patient fallen in last 6 months? No  LIVING ENVIRONMENT: Lives with: lives with their family and lives with their son Lives in: House/apartment Stairs: one story, ramped entrance Has following equipment at home: Wheelchair (power) and Wheelchair (manual), 2 wheeled walker, 3 wheeled walker, power lift, hospital bed, transfer shower bench  PLOF: Independent  PATIENT GOALS: To be able to walk again, and to drive  OBJECTIVE:   HAND DOMINANCE: Right  ADLs: Transfers/ambulation related to ADLs: Eating: Independent Grooming: Independent UB Dressing: Independent LB Dressing: Independent pants, MaxA shoes, and socks Toileting: Assist with transfers, reports independence with toileting care Bathing: Assist from DIL Tub Shower transfers: Min-ModA shower transfers Equipment: See above  IADLs: Shopping: DIL performs shopping Light housekeeping: Unable Meal Prep: Pt. Is able to retrieve a beverage, or snack, light meal prep Community mobility: Power w/c Medication management: Grand daughter assists with pillbox set-up.  Pt. Independently takes medication Financial management: No changes Handwriting: Name: 100% legible in printed form, 75% in cursive form  MOBILITY STATUS: Needs Assist: Uses power w/c  POSTURE COMMENTS:  Sitting balance: WFL supported   FUNCTIONAL OUTCOME MEASURES: FOTO: 33 TR:39  UPPER EXTREMITY ROM:    Active ROM Right WFL Left eval  Shoulder flexion  0(93)  Shoulder abduction  30(83)  Shoulder adduction    Shoulder extension    Shoulder internal rotation    Shoulder external rotation    Elbow flexion    Elbow extension  0-87(0-122)  Wrist flexion  52  Wrist extension  10(50)  Wrist ulnar deviation    Wrist radial deviation    Wrist pronation    Wrist supination    (Blank rows = not  tested)  Full Digit flexion to the [facility] Active digit extension through 75% of the range grossly Active thumb radial, and palmar abduction  UPPER EXTREMITY MMT:     MMT Right eval Left eval  Shoulder flexion  0/5  Shoulder abduction  2-/5  Shoulder adduction    Shoulder extension    Shoulder internal rotation    Shoulder external rotation    Middle trapezius    Lower trapezius    Elbow flexion  3/5  Elbow extension    Wrist flexion    Wrist extension  2/5  Wrist ulnar deviation    Wrist radial deviation    Wrist pronation    Wrist supination    (Blank rows = not tested)  HAND FUNCTION: Grip strength: Right: 18 lbs; Left: 10 lbs, Lateral pinch: Right: 5 lbs, Left: 11 lbs, and 3 point pinch: Right: 1 lbs, Left: 15 lbs  COORDINATION: Eval: N/A  SENSATION: WFL  EDEMA: TBD  MUSCLE TONE: LUE: Hypotonic  COGNITION: Overall cognitive status: Within functional limits for tasks assessed  VISION: Subjective report:  Pt. Reports having an Eye appointment Baseline vision: Wears glasses all the time  VISION ASSESSMENT: To be further assessed in functional context  PRAXIS: Impaired: Motor planning  TODAY'S TREATMENT:   Moist heat applied to R hand for pain reduction/muscle relaxation used for most of session while pt participated in LUE strengthening exercises.  Therapeutic Exercise:  Pt seen this date for PROM of left UE for shoulder flexion, ABD, horiz abd/add, ER/IR, elbow flexion/extension, forearm supination/pronation, wrist flexion/extension and digit flexion/extension followed by AAROM of all motions within available ranges x10 reps each.  Pt performed 3 sets 10 reps with hand gripper with light resistance (1 red band) for each hand; assist for set up of gripper in each hand.  Neuromuscular reeducation:  Pt performing active assisted reaching from lap to tabletop with guiding from therapist.  Pt worked on grasping 1" velcro cubes from checkerboard and moving them to  table top level.  Pt then practiced forward and lateral reaching patterns with OT assisting for active assisted ROM to move cubes along table top.  OT provided support throughout the LUE to facilitate normalized movement patterns with reduced compensatory leaning when reaching for cubes.                 PATIENT EDUCATION: Education details: limit caffeine intake after lunch time to decrease evening voiding Person educated: Patient Education method: explanation Education comprehension: verbalized understanding  HOME EXERCISE PROGRAM: Towel squeezes grip strengthening, seated marches and knee extension (lifting legs over tub)  GOALS: Goals reviewed with patient? Yes  SHORT TERM GOALS: Target date: 01/24/2023   Pt. Will demonstrate independence with HEPs for the LUE. Baseline: Eval: No current HEP Goal status: INITIAL  LONG TERM GOALS: Target date: 03/07/2023  Pt. Will increase FOTO score by 2 points for Pt. perceived improvement with assessment specific ADL/IADL improvement. Baseline: Eval: FOTO 33, TR score: 39 Goal status: INITIAL  2.  Pt. Will improve right shoulder ROM  by 10 degrees to assist with self-dressing. Baseline: Left shoulder flexion: 0(93), Abduction: 30(83) Goal status: INITIAL  3.  Pt. Will improve left wrist extension by 10 in preparation for functional reaching Baseline: Left wrist extension: 10(50) Goal status: INITIAL  4.  Pt. Will left grip strength by 5# in preparation for being able to securely stabilize ADL/IADL items at the tabletop. Baseline: Left grip strength: 10# Goal status: INITIAL  5. Pt will perform tub transfer with leg lifter and transfer tub bench with supv/set up.  Baseline: Min A with sit<>stand from tub bench, mod A to lift L leg over tub.  Goal status: NEW  6. Pt will perform UB bathing with min A.  Baseline: Mod A  Goal status: New  ASSESSMENT:  CLINICAL IMPRESSION: Pt reports R hand not feeling too bad today (4/10 pain), but pt  feeling tired.  Pt continues to report that she doesn't sleep well at night as she has to get up every 2 hours to urinate.   Pt had to be intermittently prompting to maintain alertness during session.  OT provided education on strategies to help to decrease night time voiding, including limiting caffeine after lunch time.  Pt reported that she didn't realize that caffeine contributes to increased urination, and stated that she usually drinks tea at dinner, but could switch to decaf tea.  OT also encouraged pt to limit fluid intake after supper time.  Pt verbalized understanding.  Pt demonstrating good progress overall and demonstrating increased reaching and grasping patterns.  Increased thumb motion on left noted with thumb ABD when working to grasp/release cubes from [employer].  She benefits from therapist guiding with reaching patterns and cues/facilitation for release of objects.   She tends to elevate left shoulder demonstrating compensatory movements with active reaching patterns.  Continue OT to facilitate increased active movement of left UE to improve independence in necessary daily tasks.     PERFORMANCE DEFICITS: in functional skills including ADLs, IADLs, coordination, dexterity, ROM, strength, pain, Fine motor control, and Gross motor control, cognitive skills including attention, problem solving, and safety awareness, and psychosocial skills including coping strategies, environmental adaptation, and routines and behaviors.   IMPAIRMENTS: are limiting patient from ADLs, IADLs, leisure, and social participation.   CO-MORBIDITIES: may have co-morbidities  that affects occupational performance. Patient will benefit from skilled OT to address above impairments and improve overall function.  MODIFICATION OR ASSISTANCE TO COMPLETE EVALUATION: Maximum or significant modification of tasks or assist is necessary to complete an evaluation.  OT OCCUPATIONAL PROFILE AND HISTORY: Comprehensive  assessment: Review of records and extensive additional review of physical, cognitive, psychosocial history related to current functional performance.  CLINICAL DECISION MAKING: High - multiple treatment options, significant modification of task necessary  REHAB POTENTIAL: Good  EVALUATION COMPLEXITY: High   PLAN:  OT FREQUENCY: 2x/week  OT DURATION: 12 weeks  PLANNED INTERVENTIONS: self care/ADL training, therapeutic exercise, therapeutic activity, neuromuscular re-education, manual therapy, passive range of motion, functional mobility training, electrical stimulation, and paraffin  RECOMMENDED OTHER SERVICES: OT  CONSULTED AND AGREED WITH PLAN OF CARE: Patient  PLAN FOR NEXT SESSION: therapeutic exercises, ADL/AE training   Danelle Earthly, MS, OTR/L   01/31/2023, 12:58 PM

## 2023-02-02 ENCOUNTER — Ambulatory Visit: Payer: 59 | Attending: Internal Medicine

## 2023-02-02 ENCOUNTER — Ambulatory Visit: Payer: 59

## 2023-02-02 DIAGNOSIS — R2681 Unsteadiness on feet: Secondary | ICD-10-CM | POA: Diagnosis present

## 2023-02-02 DIAGNOSIS — R278 Other lack of coordination: Secondary | ICD-10-CM

## 2023-02-02 DIAGNOSIS — M79641 Pain in right hand: Secondary | ICD-10-CM | POA: Insufficient documentation

## 2023-02-02 DIAGNOSIS — M6281 Muscle weakness (generalized): Secondary | ICD-10-CM

## 2023-02-02 DIAGNOSIS — R262 Difficulty in walking, not elsewhere classified: Secondary | ICD-10-CM | POA: Insufficient documentation

## 2023-02-02 NOTE — Therapy (Signed)
OUTPATIENT PHYSICAL THERAPY NEURO TREATMENT NOTE    Patient Name: Dana Lucas MRN: [id number] DOB:1951-07-25, 72 y.o., female Today's Date: 02/02/2023   PCP: Bobbye Morton MD REFERRING PROVIDER: Louis Matte MD   END OF SESSION:  PT End of Session - 02/02/23 0952     Visit Number 12    Number of Visits 24    Date for PT Re-Evaluation 02/28/23    Authorization Type 1/10 eval 3/5    PT Start Time 0934    PT Stop Time 1014    PT Time Calculation (min) 40 min    Equipment Utilized During Treatment Gait belt    Activity Tolerance Patient tolerated treatment well;No increased pain    Behavior During Therapy WFL for tasks assessed/performed                  Past Medical History:  Diagnosis Date   Diabetes mellitus without complication (HCC)    Hypertension    Stroke [facility])    Past Surgical History:  Procedure Laterality Date   ABDOMINAL HYSTERECTOMY     COLONOSCOPY N/A 10/06/2019   Procedure: COLONOSCOPY;  Surgeon: Toledo, Boykin Nearing, MD;  Location: [facility] ENDOSCOPY;  Service: Gastroenterology;  Laterality: N/A;   Patient Active Problem List   Diagnosis Date Noted   Hypertension    Diabetes mellitus without complication (HCC)    Stroke (HCC)    GIB (gastrointestinal bleeding)    AKI (acute kidney injury) (HCC)    UTI (urinary tract infection)     ONSET DATE: 2020  REFERRING DIAG: 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   THERAPY DIAG:  Muscle weakness (generalized)  Difficulty in walking, not elsewhere classified  Other lack of coordination  Unsteadiness on feet  Rationale for Evaluation and Treatment: Rehabilitation  SUBJECTIVE:                                                                                                                                                                                             SUBJECTIVE STATEMENT: Pt reports hand is still hurting. No, falls or current concerns. Last  visit tired her out. 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 Stroke was four years ago, has had multiple strokes. Had a little bit of therapy, afterwards in [place]. Moved up to [place] about three years ago. [name] includes DM with diabetic neuropathy, stroke, HTN, AKI, UTI. Uses a walker to walk to the bathroom (two wheel and three wheel walkers).  Has a power chair for  out of the house.   PAIN:  Are you having pain? Yes: NPRS scale: 4/10 Pain location: R knee pain Pain description: aching Aggravating factors: standing, moving Relieving factors: nothing known.   PRECAUTIONS: Fall  WEIGHT BEARING RESTRICTIONS: No  FALLS: Has patient fallen in last 6 months? No  LIVING ENVIRONMENT: Lives with: lives with their family Lives in: House/apartment Stairs:  ramp outside Has following equipment at home: [name] - 2 wheeled, Environmental consultant - 4 wheeled, Wheelchair (power), shower chair, Ramped entry, and chair lift and hospital bed   PLOF: Independent  PATIENT GOALS: get up and walk by herself, wants to walk with a cane.   OBJECTIVE:   DIAGNOSTIC FINDINGS: CVA in [place]; no imaging in system  COGNITION: Overall cognitive status:  a little bit per granddaughter.    SENSATION: WFL  COORDINATION: Limited LLE     POSTURE: rounded shoulders, forward head, flexed trunk , and weight shift right    LOWER EXTREMITY MMT:    MMT Right Eval Left Eval  Hip flexion 4 2  Hip extension    Hip abduction 4 2+  Hip adduction 4 2+  Knee flexion 4 2-  Knee extension 4 2+  Ankle dorsiflexion 3   Ankle plantarflexion 3   (Blank rows = not tested)   TRANSFERS: Assistive device utilized: Environmental consultant - 2 wheeled  Sit to stand: CGA and Min A; blocking of L foot  Stand to sit: CGA Chair to chair: Min A    GAIT: Gait pattern: step to pattern, decreased stance time- Left, decreased  ankle dorsiflexion- Left, and poor foot clearance- Left Distance walked: 23ft Assistive device utilized: Environmental consultant - 2 wheeled Level of assistance: CGA Comments: L knee hyperextension with weightbearing  FUNCTIONAL TESTS:  5 times sit to stand: 38 seconds with SUE support  10 meter walk test: 1 min 43 seconds with RW  PATIENT SURVEYS:  FOTO 46  TODAY'S TREATMENT:                                                                                                                              DATE: 02/02/23   TE: Gait belt donned and CGA provided unless specified otherwise  Ambulation FWD and with turns in // bars x 6x length of bars- BUE support. Pt rates medium  Forward/backward stepping over half-foam with BUE support 1x4. Unable to fully clear obstacle with LLE. Seated step tap 6" step multiple attempts, too challenging Seated step tap half-foam x multiple reps each LE, difficulty with lifting LLE off half-foam  STS 2x8 (pull to stand)  No AW, seated: LAQ 2x15 each LE- Pt rates medium Seated march each LE 1x15  Seated balloon toss (RUE) x 3 min  to promote core activation, seated balance and coordination  Seated soccer ball kicks focus RLE  x multiple reps to promote quad strength/activation and coordination  STS 5x (pull to stand)    No pain with interventions.  PATIENT EDUCATION: Education details: Pt educated throughout session about proper posture and technique with exercises. Improved exercise technique, movement at target joints, use of target muscles after min to mod verbal, visual, tactile cues. Goal retesting, indications   Person educated: Patient and granddaughter Education method: Explanation, Demonstration, Tactile cues, and Verbal cues Education comprehension: verbalized understanding, returned demonstration, verbal cues required, tactile cues required, and needs further education  HOME EXERCISE PROGRAM: Access Code: [email or web address] URL:  [email or web address] Date: 12/06/2022 Prepared by: Precious Bard  Exercises - Leg Extension  - 1 x daily - 7 x weekly - 2 sets - 10 reps - 5 hold - Seated March  - 1 x daily - 7 x weekly - 2 sets - 10 reps - 5 hold - Seated Hip Abduction  - 1 x daily - 7 x weekly - 2 sets - 10 reps - 5 hold - Seated Hip Adduction Isometrics with Ball  - 1 x daily - 7 x weekly - 2 sets - 10 reps - 5 hold - Seated Weight Shifting Without Arm Support  - 1 x daily - 7 x weekly - 2 sets - 10 reps - 5 hold   GOALS: Goals reviewed with patient? Yes  SHORT TERM GOALS: Target date: 01/03/2023    Patient will be independent in home exercise program to improve strength/mobility for better functional independence with ADLs. Baseline: 01/26/2023: pt has some difficulty with HEP Goal status: IN PROGRESS    LONG TERM GOALS: Target date: 02/28/2023    Patient will increase FOTO score to equal to or greater than 53    to demonstrate statistically significant improvement in mobility and quality of life.  Baseline: 3/5: 46%; 01/26/23: 45%  Goal status: IN PROGRESS  2.  Patient (> 36 years old) will complete five times sit to stand test in < 15 seconds indicating an increased LE strength and improved balance. Baseline: 3/5: 38 seconds with SUE support; blocking of LLE; 01/26/23 18 seconds with pulling to stand Goal status: IN PROGRESS  3.  Patient will increase 10 meter walk test to >1.61m/s as to improve gait speed for better community ambulation and to reduce fall risk Baseline: 3/5: 1 min 43 seconds with RW and w/c follow; 01/26/2023: 2 min 6 seconds  Goal status: IN PROGRESS  4.  Patient will increase BLE gross strength to 4/5 as to improve functional strength for independent gait, increased standing tolerance and increased ADL ability. Baseline: 3/5: grossly 2/5 LLE; 4//25/24: RLE grossly 4-/5, LLE grossly 2/5  Goal status: IN PROGRESS    ASSESSMENT:  CLINICAL IMPRESSION: Interventions  somewhat regressed today as pt presents with increased fatigue. However, pt highly motivated to participate. Despite fatigue, pt to increase number of STS reps completed on this date. The patient will benefit from skilled physical therapy to improve function, mobility, and strength.  OBJECTIVE IMPAIRMENTS: Abnormal gait, cardiopulmonary status limiting activity, decreased activity tolerance, decreased balance, decreased coordination, decreased endurance, decreased knowledge of use of DME, decreased mobility, difficulty walking, decreased ROM, decreased strength, impaired perceived functional ability, impaired flexibility, impaired UE functional use, improper body mechanics, postural dysfunction, obesity, and pain.   ACTIVITY LIMITATIONS: carrying, lifting, bending, sitting, standing, squatting, sleeping, stairs, transfers, bed mobility, bathing, toileting, dressing, reach over head, and caring for others  PARTICIPATION LIMITATIONS: meal prep, cleaning, laundry, interpersonal relationship, driving, shopping, community activity, and church  PERSONAL FACTORS: Age, Behavior pattern, Education, Fitness, Past/current experiences, Profession, Sex, Time since onset of injury/illness/exacerbation, and 3+ comorbidities: DM with diabetic  neuropathy, stroke, HTN, AKI, UTI  are also affecting patient's functional outcome.   REHAB POTENTIAL: Fair length of time since CVA  CLINICAL DECISION MAKING: Evolving/moderate complexity  EVALUATION COMPLEXITY: Moderate  PLAN:  PT FREQUENCY: 2x/week  PT DURATION: 12 weeks  PLANNED INTERVENTIONS: Therapeutic exercises, Therapeutic activity, Neuromuscular re-education, Balance training, Gait training, Patient/Family education, Self Care, Joint mobilization, Stair training, Vestibular training, Canalith repositioning, Visual/preceptual remediation/compensation, Orthotic/Fit training, DME instructions, Cognitive remediation, Electrical stimulation, Wheelchair mobility  training, Spinal mobilization, Cryotherapy, Moist heat, Splintting, Taping, Traction, Ultrasound, Manual therapy, and Re-evaluation  PLAN FOR NEXT SESSION: stand in // bars, work on walking, strengthening LLE.  Baird Kay, PT 02/02/2023, 10:27 AM

## 2023-02-02 NOTE — Therapy (Signed)
OUTPATIENT OCCUPATIONAL THERAPY NEURO PROGRESS AND TREATMENT NOTE Reporting period 12/13/22-02/02/23 Patient Name: Dana Lucas MRN: [id number] DOB:26-Feb-1951, 72 y.o., female   PCP: Hinda Lenis, MD REFERRING PROVIDER: Sumner Boast, MD  END OF SESSION:  OT End of Session - 02/02/23 0846     Visit Number 10    Number of Visits 24    Date for OT Re-Evaluation 03/07/23    Authorization Type Progress report period starting 12/13/2022-02/02/23    Progress Note Due on Visit 10    OT Start Time 0845    OT Stop Time 0930    OT Time Calculation (min) 45 min    Activity Tolerance Patient tolerated treatment well    Behavior During Therapy [facility] for tasks assessed/performed            Past Medical History:  Diagnosis Date   Diabetes mellitus without complication (HCC)    Hypertension    Stroke [facility])    Past Surgical History:  Procedure Laterality Date   ABDOMINAL HYSTERECTOMY     COLONOSCOPY N/A 10/06/2019   Procedure: COLONOSCOPY;  Surgeon: Toledo, Boykin Nearing, MD;  Location: [facility] ENDOSCOPY;  Service: Gastroenterology;  Laterality: N/A;   Patient Active Problem List   Diagnosis Date Noted   Hypertension    Diabetes mellitus without complication (HCC)    Stroke (HCC)    GIB (gastrointestinal bleeding)    AKI (acute kidney injury) (HCC)    UTI (urinary tract infection)    ONSET DATE: 05/2014  REFERRING DIAG: CVA  THERAPY DIAG:  Muscle weakness (generalized)  Other lack of coordination  Pain in right hand  Rationale for Evaluation and Treatment: Rehabilitation  SUBJECTIVE:   SUBJECTIVE STATEMENT: Pt reports that she is no longer drinking tea at dinner time and has cut down on her night time voiding from getting up 5x a night to 3x a night.  OT continues to reinforce no caffeine in the late afternoon or evening and reduce fluid intake after supper time.  Pt verbalized understanding.  Pt accompanied by: family member grand daughter  PERTINENT HISTORY: Pt. is a 72  y.o. female who has a history of multiple CVAs beginning in 24. Pt. 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   PRECAUTIONS: None  WEIGHT BEARING RESTRICTIONS: No  PAIN:  Are you having pain? 4/10 right hand arthritic pain; improves with moist heat during session FALLS: Has patient fallen in last 6 months? No  LIVING ENVIRONMENT: Lives with: lives with their family and lives with their son Lives in: House/apartment Stairs: one story, ramped entrance Has following equipment at home: Wheelchair (power) and Wheelchair (manual), 2 wheeled walker, 3 wheeled walker, power lift, hospital bed, transfer shower bench  PLOF: Independent  PATIENT GOALS: To be able to walk again, and to drive  OBJECTIVE:   HAND DOMINANCE: Right  ADLs: Transfers/ambulation related to ADLs: Eating: Independent Grooming: Independent UB Dressing: Independent LB Dressing: Independent pants, MaxA shoes, and socks Toileting: Assist with transfers, reports independence with toileting care Bathing: Assist from DIL Tub Shower transfers: Min-ModA shower transfers Equipment: See above  IADLs: Shopping: DIL performs shopping Light housekeeping: Unable Meal Prep: Pt. Is able to retrieve a beverage, or snack, light meal prep Community mobility: Power w/c Medication management: Grand daughter assists with pillbox set-up.  Pt. Independently takes medication Financial management: No changes Handwriting: Name: 100% legible in printed form, 75% in cursive form  MOBILITY STATUS: Needs Assist: Uses power w/c  POSTURE COMMENTS:  Sitting balance: WFL supported  FUNCTIONAL OUTCOME MEASURES: FOTO: 33 TR:39 02/02/23: 44  UPPER EXTREMITY ROM:    Active ROM Right WFL Left eval Left 02/02/23  Shoulder flexion  0(93) 0 (90)  Shoulder abduction  30(83) 35 (90)   Shoulder adduction     Shoulder extension     Shoulder internal rotation     Shoulder external rotation     Elbow flexion   0-87(0-122) 80  Elbow extension   -40  Wrist flexion  52 82  Wrist extension  10(50) 0 (58)  Wrist ulnar deviation     Wrist radial deviation     Wrist pronation     Wrist supination     (Blank rows = not tested)  Full Digit flexion to the [facility] Active digit extension through 75% of the range grossly Active thumb radial, and palmar abduction  UPPER EXTREMITY MMT:     MMT Right eval Left eval Left 02/02/23  Shoulder flexion  0/5 0  Shoulder abduction  2-/5 2-  Shoulder adduction     Shoulder extension     Shoulder internal rotation     Shoulder external rotation     Middle trapezius     Lower trapezius     Elbow flexion  3/5 3-  Elbow extension     Wrist flexion     Wrist extension  2/5 2  Wrist ulnar deviation     Wrist radial deviation     Wrist pronation     Wrist supination     (Blank rows = not tested)  HAND FUNCTION: Grip strength: Right: 18 lbs; Left: 10 lbs, Lateral pinch: Right: 5 lbs, Left: 11 lbs, and 3 point pinch: Right: 1 lbs, Left: 15 lbs 02/02/23: Grip strength: Right: 8 lbs (limited by pain); Left: 25 lbs, Lateral pinch: Right: 5 lbs, Left: 12 lbs, and 3 point pinch: Right: 4 lbs, Left: 6 lbs  COORDINATION: Eval: N/A 02/02/23: 9 hole R: 42 sec; L: (unable) Able to pick up 1 peg and place in hole   SENSATION: WFL  EDEMA: TBD  MUSCLE TONE: LUE: Hypotonic  COGNITION: Overall cognitive status: Within functional limits for tasks assessed  VISION: Subjective report:  Pt. Reports having an Eye appointment Baseline vision: Wears glasses all the time  VISION ASSESSMENT: To be further assessed in functional context  PRAXIS: Impaired: Motor planning  TODAY'S TREATMENT:   Moist heat applied to R hand for pain reduction/muscle relaxation used for most of session while pt participated in LUE strengthening exercises.  Therapeutic Exercise:  Objective measures taken and goals updated for progress note.  Pt seen this date for PROM of left UE for shoulder  flexion, ABD, horiz abd/add, ER/IR, elbow flexion/extension, forearm supination/pronation, wrist flexion/extension and digit flexion/extension followed by AAROM of all motions within available ranges x2 sets 10 reps each.                PATIENT EDUCATION: Education details: limit caffeine intake after lunch time to decrease evening voiding, HEP review Person educated: Patient Education method: explanation Education comprehension: verbalized understanding  HOME EXERCISE PROGRAM: Towel squeezes grip strengthening, seated marches and knee extension (lifting legs over tub)  GOALS: Goals reviewed with patient? Yes  SHORT TERM GOALS: Target date: 01/24/2023   Pt. Will demonstrate independence with HEPs for the LUE. Baseline: Eval: No current HEP; 02/02/23: Pt demos self passive stretching at the shoulder in a couple different planes (horiz add, flexion); pt has been encouraged to perform towel squeezes for grip strengthening, but  pt reports she has not yet done this at home. Goal status: ongoing  LONG TERM GOALS: Target date: 03/07/2023  Pt. Will increase FOTO score by 2 points for Pt. perceived improvement with assessment specific ADL/IADL improvement. Baseline: Eval: FOTO 33, TR score: 39; 02/02/23: FOTO 44 Goal status: achieved/ongoing  2.  Pt. Will improve left shoulder ROM by 10 degrees to assist with self-dressing. Baseline: Left shoulder flexion: 0(93), Abduction: 30(83); 02/02/23: L shoulder flexion 0 (90), abd 35 (90) Goal status: ongoing  3.  Pt. Will improve left wrist extension by 10 in preparation for functional reaching Baseline: Left wrist extension: 10(50); 02/02/23: L wrist ext 0 (58) Goal status: ongoing  4.  Pt. Will increase left grip strength to 30# or more in preparation for being able to securely stabilize ADL/IADL items at the tabletop. (revised from 5# on 02/02/23) Baseline: Left grip strength: 10#; 02/02/23: L 25# Goal status: revised/ongoing  5. Pt will perform tub  transfer with leg lifter and transfer tub bench with supv/set up. Baseline: Min A with sit<>stand from tub bench, mod A to lift L leg over tub; 02/02/23: supv with sit<>stand from tub bench, min A to lift L leg over tub with use of leg lifter   Goal status: ongoing  6. Pt will perform UB bathing with min A.  Baseline: Mod A; 02/02/23: min A  Goal status: achieved  ASSESSMENT: CLINICAL IMPRESSION:  Pt seen for 10th visit progress update.  FOTO score has improved from 33 to 44.  Overall, pt reports improvements in her ability to transfer in/out of bed.  L grip has improved and pt reporting improved ability to stabilize a bowl or plate with the L hand while self feeding with the R hand.  No significant gains in strength or ROM in the proximal LUE, but L grip has significantly improved with an increase from 10# to 25#.  R hand strength is limited by arthritic pain, and actually noted a decline today as compared to eval.  Pt responds well to moist heat for R hand pain management during OT sessions.  Pt tends to have difficulty maintaining alertness during OT sessions, as she states that she does not sleep well at night time d/t having to get up every 2 hours to urinate.  Last session OT provided education on avoiding caffeine after mid day and limiting fluid intake after supper.  Pt reported today that after she stopped drinking tea with caffeine at supper time, she has only been getting up 3x per night instead of 5x, and did present as more alert today.  Pt wants to continue to focus on increasing strength throughout the LUE over the next assessment period in order to continue to increase use of LUE with ADLs as a stabilizer.  Continue OT to facilitate increased active movement of left UE to improve independence in necessary daily tasks.     PERFORMANCE DEFICITS: in functional skills including ADLs, IADLs, coordination, dexterity, ROM, strength, pain, Fine motor control, and Gross motor control, cognitive skills  including attention, problem solving, and safety awareness, and psychosocial skills including coping strategies, environmental adaptation, and routines and behaviors.   IMPAIRMENTS: are limiting patient from ADLs, IADLs, leisure, and social participation.   CO-MORBIDITIES: may have co-morbidities  that affects occupational performance. Patient will benefit from skilled OT to address above impairments and improve overall function.  MODIFICATION OR ASSISTANCE TO COMPLETE EVALUATION: Maximum or significant modification of tasks or assist is necessary to complete an evaluation.  OT  OCCUPATIONAL PROFILE AND HISTORY: Comprehensive assessment: Review of records and extensive additional review of physical, cognitive, psychosocial history related to current functional performance.  CLINICAL DECISION MAKING: High - multiple treatment options, significant modification of task necessary  REHAB POTENTIAL: Good  EVALUATION COMPLEXITY: High  PLAN:  OT FREQUENCY: 2x/week  OT DURATION: 12 weeks  PLANNED INTERVENTIONS: self care/ADL training, therapeutic exercise, therapeutic activity, neuromuscular re-education, manual therapy, passive range of motion, functional mobility training, electrical stimulation, and paraffin  RECOMMENDED OTHER SERVICES: OT  CONSULTED AND AGREED WITH PLAN OF CARE: Patient  PLAN FOR NEXT SESSION: therapeutic exercises, ADL/AE training   Danelle Earthly, MS, OTR/L  02/02/2023, 10:45 AM

## 2023-02-07 ENCOUNTER — Ambulatory Visit: Payer: 59

## 2023-02-07 DIAGNOSIS — M79641 Pain in right hand: Secondary | ICD-10-CM

## 2023-02-07 DIAGNOSIS — R262 Difficulty in walking, not elsewhere classified: Secondary | ICD-10-CM

## 2023-02-07 DIAGNOSIS — M6281 Muscle weakness (generalized): Secondary | ICD-10-CM

## 2023-02-07 DIAGNOSIS — R278 Other lack of coordination: Secondary | ICD-10-CM

## 2023-02-07 NOTE — Therapy (Signed)
OUTPATIENT PHYSICAL THERAPY NEURO TREATMENT NOTE    Patient Name: Dana Lucas MRN: [id number] DOB:[birth year], 72 y.o., female Today's Date: 02/07/2023   PCP: Bobbye Morton MD REFERRING PROVIDER: Louis Matte MD   END OF SESSION:  PT End of Session - 02/07/23 1258     Visit Number 11    Number of Visits 24    Date for PT Re-Evaluation 02/28/23    Authorization Type 1/10 eval 3/5    PT Start Time 0933    PT Stop Time 1013    PT Time Calculation (min) 40 min    Equipment Utilized During Treatment Gait belt    Activity Tolerance Patient tolerated treatment well;No increased pain;Other (comment)   session limited to LLE swelling   Behavior During Therapy [facility] for tasks assessed/performed                  Past Medical History:  Diagnosis Date   Diabetes mellitus without complication (HCC)    Hypertension    Stroke [facility])    Past Surgical History:  Procedure Laterality Date   ABDOMINAL HYSTERECTOMY     COLONOSCOPY N/A 10/06/2019   Procedure: COLONOSCOPY;  Surgeon: Toledo, Boykin Nearing, MD;  Location: [facility] ENDOSCOPY;  Service: Gastroenterology;  Laterality: N/A;   Patient Active Problem List   Diagnosis Date Noted   Hypertension    Diabetes mellitus without complication (HCC)    Stroke (HCC)    GIB (gastrointestinal bleeding)    AKI (acute kidney injury) (HCC)    UTI (urinary tract infection)     ONSET DATE: 2020  REFERRING DIAG: 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   THERAPY DIAG:  Muscle weakness (generalized)  Difficulty in walking, not elsewhere classified  Rationale for Evaluation and Treatment: Rehabilitation  SUBJECTIVE:                                                                                                                                                                                             SUBJECTIVE STATEMENT: Pt presents with DIL. Pt and DIL reports pt woke up this morning with  increased LLE swelling and was unable to put on her shoes. Pt has not had this level of swelling since January per report. Pt reports she does have hx of lymphedeam that affected LLE (this was 4 years ago). Pt denies SOB, pain in calf, or any other current symptoms aside from L great toe pain and her usual UE pain. 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  Stroke was four years ago, has had multiple strokes. Had a little bit of therapy, afterwards in [place]. Moved up to [place] about three years ago. [name] includes DM with diabetic neuropathy, stroke, HTN, AKI, UTI. Uses a walker to walk to the bathroom (two wheel and three wheel walkers).  Has a power chair for out of the house.   PAIN:  Are you having pain? Yes: NPRS scale: 4/10 Pain location: R knee pain Pain description: aching Aggravating factors: standing, moving Relieving factors: nothing known.   PRECAUTIONS: Fall  WEIGHT BEARING RESTRICTIONS: No  FALLS: Has patient fallen in last 6 months? No  LIVING ENVIRONMENT: Lives with: lives with their family Lives in: House/apartment Stairs:  ramp outside Has following equipment at home: [name] - 2 wheeled, Environmental consultant - 4 wheeled, Wheelchair (power), shower chair, Ramped entry, and chair lift and hospital bed   PLOF: Independent  PATIENT GOALS: get up and walk by herself, wants to walk with a cane.   OBJECTIVE:   DIAGNOSTIC FINDINGS: CVA in [place]; no imaging in system  COGNITION: Overall cognitive status:  a little bit per granddaughter.    SENSATION: WFL  COORDINATION: Limited LLE     POSTURE: rounded shoulders, forward head, flexed trunk , and weight shift right    LOWER EXTREMITY MMT:    MMT Right Eval Left Eval  Hip flexion 4 2  Hip extension    Hip abduction 4 2+  Hip adduction 4 2+  Knee flexion 4 2-  Knee extension 4 2+  Ankle dorsiflexion 3    Ankle plantarflexion 3   (Blank rows = not tested)   TRANSFERS: Assistive device utilized: Environmental consultant - 2 wheeled  Sit to stand: CGA and Min A; blocking of L foot  Stand to sit: CGA Chair to chair: Min A    GAIT: Gait pattern: step to pattern, decreased stance time- Left, decreased ankle dorsiflexion- Left, and poor foot clearance- Left Distance walked: 70ft Assistive device utilized: Environmental consultant - 2 wheeled Level of assistance: CGA Comments: L knee hyperextension with weightbearing  FUNCTIONAL TESTS:  5 times sit to stand: 38 seconds with SUE support  10 meter walk test: 1 min 43 seconds with RW  PATIENT SURVEYS:  FOTO 46  TODAY'S TREATMENT:                                                                                                                              DATE: 02/07/23   TE: Gait belt donned and CGA provided unless specified otherwise  Pt without LLE supported on power chair LE platform/foot rest due to inability to bend L knee from increased swelling. PT advises pt to stay buckled in to her chair (has seatbelt) due to sliding forward in chair without bilat LE support. PT assists pt in buckling seat belt, DIL present for this as well.  Integumentary: No redness observed bilat LE, no wounds observed bilat LE. Pt calf not TTP bilaterally. Pt  only with great L toe pain. Swelling observed bilat LE and feet but L>R. Bilat feet warm to touch on dorsal surface. PT advises pt to follow-up with her doctor due to sudden onset of LLE swelling. Pt and DIL verbalize understanding. PT does contact pt PCP's office during session  (Dr. Sherrin Daisy, Asencion Islam, MD) and speaks with nursing relaying findings in session. Nurse reports they will call pt to follow-up to determine if she should be seen today. Pt reports she does have an appointment tomorrow. PT still advises pt to go to ED should she experience SOB or other worsening symptoms. Pt and DIL verbalize understanding, agreeable to  plan.  Other interventions: Seated oblique twist 1x15 bilat. Pt then repeats with RTB oblique twist to R & L 15x LAQ RLE only 2x20. Pt rates medium  March 10x, 20x RLE only RLE only seated DF 2x20 Rates medium RLE only seated PF 2x20 Rates medium RLE RTB ankle eversion 2x15 RLE RTB ankle inversion 2x15     No pain with interventions.    PATIENT EDUCATION: Education details: Pt educated throughout session about proper posture and technique with exercises. Improved exercise technique, movement at target joints, use of target muscles after min to mod verbal, visual, tactile cues. Goal retesting, indications   Person educated: Patient and granddaughter Education method: Explanation, Demonstration, Tactile cues, and Verbal cues Education comprehension: verbalized understanding, returned demonstration, verbal cues required, tactile cues required, and needs further education  HOME EXERCISE PROGRAM: Access Code: [email or web address] URL: [email or web address] Date: 12/06/2022 Prepared by: Precious Bard  Exercises - Leg Extension  - 1 x daily - 7 x weekly - 2 sets - 10 reps - 5 hold - Seated March  - 1 x daily - 7 x weekly - 2 sets - 10 reps - 5 hold - Seated Hip Abduction  - 1 x daily - 7 x weekly - 2 sets - 10 reps - 5 hold - Seated Hip Adduction Isometrics with Ball  - 1 x daily - 7 x weekly - 2 sets - 10 reps - 5 hold - Seated Weight Shifting Without Arm Support  - 1 x daily - 7 x weekly - 2 sets - 10 reps - 5 hold   GOALS: Goals reviewed with patient? Yes  SHORT TERM GOALS: Target date: 01/03/2023    Patient will be independent in home exercise program to improve strength/mobility for better functional independence with ADLs. Baseline: 01/26/2023: pt has some difficulty with HEP Goal status: IN PROGRESS    LONG TERM GOALS: Target date: 02/28/2023    Patient will increase FOTO score to equal to or greater than 53    to demonstrate statistically significant improvement in  mobility and quality of life.  Baseline: 3/5: 46%; 01/26/23: 45%  Goal status: IN PROGRESS  2.  Patient (> 108 years old) will complete five times sit to stand test in < 15 seconds indicating an increased LE strength and improved balance. Baseline: 3/5: 38 seconds with SUE support; blocking of LLE; 01/26/23 18 seconds with pulling to stand Goal status: IN PROGRESS  3.  Patient will increase 10 meter walk test to >1.66m/s as to improve gait speed for better community ambulation and to reduce fall risk Baseline: 3/5: 1 min 43 seconds with RW and w/c follow; 01/26/2023: 2 min 6 seconds  Goal status: IN PROGRESS  4.  Patient will increase BLE gross strength to 4/5 as to improve functional strength for independent gait, increased standing tolerance and increased ADL ability.  Baseline: 3/5: grossly 2/5 LLE; 4//25/24: RLE grossly 4-/5, LLE grossly 2/5  Goal status: IN PROGRESS    ASSESSMENT:  CLINICAL IMPRESSION: Session significantly limited today secondary to pt presenting with acute increase in LLE swelling. PT assessed bliat LE with L more swollen than R, but no other significant symptom. PT did call pt's PCP's office during session and PT advised pt when to seek emergency care. Pt waiting for call-back from PCP office to determine if she should be seen by MD today. Please see objective above for full details. The patient will benefit from skilled physical therapy to improve function, mobility, and strength.  OBJECTIVE IMPAIRMENTS: Abnormal gait, cardiopulmonary status limiting activity, decreased activity tolerance, decreased balance, decreased coordination, decreased endurance, decreased knowledge of use of DME, decreased mobility, difficulty walking, decreased ROM, decreased strength, impaired perceived functional ability, impaired flexibility, impaired UE functional use, improper body mechanics, postural dysfunction, obesity, and pain.   ACTIVITY LIMITATIONS: carrying, lifting, bending, sitting,  standing, squatting, sleeping, stairs, transfers, bed mobility, bathing, toileting, dressing, reach over head, and caring for others  PARTICIPATION LIMITATIONS: meal prep, cleaning, laundry, interpersonal relationship, driving, shopping, community activity, and church  PERSONAL FACTORS: Age, Behavior pattern, Education, Fitness, Past/current experiences, Profession, Sex, Time since onset of injury/illness/exacerbation, and 3+ comorbidities: DM with diabetic neuropathy, stroke, HTN, AKI, UTI  are also affecting patient's functional outcome.   REHAB POTENTIAL: Fair length of time since CVA  CLINICAL DECISION MAKING: Evolving/moderate complexity  EVALUATION COMPLEXITY: Moderate  PLAN:  PT FREQUENCY: 2x/week  PT DURATION: 12 weeks  PLANNED INTERVENTIONS: Therapeutic exercises, Therapeutic activity, Neuromuscular re-education, Balance training, Gait training, Patient/Family education, Self Care, Joint mobilization, Stair training, Vestibular training, Canalith repositioning, Visual/preceptual remediation/compensation, Orthotic/Fit training, DME instructions, Cognitive remediation, Electrical stimulation, Wheelchair mobility training, Spinal mobilization, Cryotherapy, Moist heat, Splintting, Taping, Traction, Ultrasound, Manual therapy, and Re-evaluation  PLAN FOR NEXT SESSION: stand in // bars, work on walking, strengthening LLE.  Baird Kay, PT 02/07/2023, 2:00 PM

## 2023-02-07 NOTE — Therapy (Signed)
OUTPATIENT OCCUPATIONAL THERAPY NEURO TREATMENT NOTE  Patient Name: Dana Lucas MRN: [id number] DOB:08-07-1951, 72 y.o., female   PCP: Hinda Lenis, MD REFERRING PROVIDER: Sumner Boast, MD  END OF SESSION:  OT End of Session - 02/07/23 1503     Visit Number 11    Number of Visits 24    Date for OT Re-Evaluation 03/07/23    Authorization Type Progress report period starting 02/02/23    Progress Note Due on Visit 10    OT Start Time 1015    OT Stop Time 1100    OT Time Calculation (min) 45 min    Activity Tolerance Patient tolerated treatment well    Behavior During Therapy [facility] for tasks assessed/performed            Past Medical History:  Diagnosis Date   Diabetes mellitus without complication (HCC)    Hypertension    Stroke [facility])    Past Surgical History:  Procedure Laterality Date   ABDOMINAL HYSTERECTOMY     COLONOSCOPY N/A 10/06/2019   Procedure: COLONOSCOPY;  Surgeon: Toledo, Boykin Nearing, MD;  Location: [facility] ENDOSCOPY;  Service: Gastroenterology;  Laterality: N/A;   Patient Active Problem List   Diagnosis Date Noted   Hypertension    Diabetes mellitus without complication (HCC)    Stroke (HCC)    GIB (gastrointestinal bleeding)    AKI (acute kidney injury) (HCC)    UTI (urinary tract infection)    ONSET DATE: 05/2014  REFERRING DIAG: CVA  THERAPY DIAG:  Muscle weakness (generalized)  Other lack of coordination  Pain in right hand  Rationale for Evaluation and Treatment: Rehabilitation  SUBJECTIVE:   SUBJECTIVE STATEMENT: Pt reports it hurt too badly to put her shoes on this morning.  PT communicated to OT that pt has been having high pain levels today in L great toe and PT has informed MD.  Pt awaiting return phone call from MD office.    Pt accompanied by: family member grand daughter  PERTINENT HISTORY: Pt. is a 72 y.o. female who has a history of multiple CVAs beginning in 98. Pt. Has Hemiplegia/Hemiparesis 2/2 CVA affecting the Left  nondominant side.   PRECAUTIONS: None  WEIGHT BEARING RESTRICTIONS: No  PAIN:  Are you having pain? 10/10 L foot big toe, 5/10 R knee pain, 5/10 right hand arthritic pain; improves with moist heat during session FALLS: Has patient fallen in last 6 months? No  LIVING ENVIRONMENT: Lives with: lives with their family and lives with their son Lives in: House/apartment Stairs: one story, ramped entrance Has following equipment at home: Wheelchair (power) and Wheelchair (manual), 2 wheeled walker, 3 wheeled walker, power lift, hospital bed, transfer shower bench  PLOF: Independent  PATIENT GOALS: To be able to walk again, and to drive  OBJECTIVE:   HAND DOMINANCE: Right  ADLs: Transfers/ambulation related to ADLs: Eating: Independent Grooming: Independent UB Dressing: Independent LB Dressing: Independent pants, MaxA shoes, and socks Toileting: Assist with transfers, reports independence with toileting care Bathing: Assist from DIL Tub Shower transfers: Min-ModA shower transfers Equipment: See above  IADLs: Shopping: DIL performs shopping Light housekeeping: Unable Meal Prep: Pt. Is able to retrieve a beverage, or snack, light meal prep Community mobility: Power w/c Medication management: Grand daughter assists with pillbox set-up.  Pt. Independently takes medication Financial management: No changes Handwriting: Name: 100% legible in printed form, 75% in cursive form  MOBILITY STATUS: Needs Assist: Uses power w/c  POSTURE COMMENTS:  Sitting balance: WFL supported   FUNCTIONAL OUTCOME  MEASURES: FOTO: 33 TR:39 02/02/23: 44  UPPER EXTREMITY ROM:    Active ROM Right WFL Left eval Left 02/02/23  Shoulder flexion  0(93) 0 (90)  Shoulder abduction  30(83) 35 (90)   Shoulder adduction     Shoulder extension     Shoulder internal rotation     Shoulder external rotation     Elbow flexion  0-87(0-122) 80  Elbow extension   -40  Wrist flexion  52 82  Wrist extension   10(50) 0 (58)  Wrist ulnar deviation     Wrist radial deviation     Wrist pronation     Wrist supination     (Blank rows = not tested)  Full Digit flexion to the [facility] Active digit extension through 75% of the range grossly Active thumb radial, and palmar abduction  UPPER EXTREMITY MMT:     MMT Right eval Left eval Left 02/02/23  Shoulder flexion  0/5 0  Shoulder abduction  2-/5 2-  Shoulder adduction     Shoulder extension     Shoulder internal rotation     Shoulder external rotation     Middle trapezius     Lower trapezius     Elbow flexion  3/5 3-  Elbow extension     Wrist flexion     Wrist extension  2/5 2  Wrist ulnar deviation     Wrist radial deviation     Wrist pronation     Wrist supination     (Blank rows = not tested)  HAND FUNCTION: Grip strength: Right: 18 lbs; Left: 10 lbs, Lateral pinch: Right: 5 lbs, Left: 11 lbs, and 3 point pinch: Right: 1 lbs, Left: 15 lbs 02/02/23: Grip strength: Right: 8 lbs (limited by pain); Left: 25 lbs, Lateral pinch: Right: 5 lbs, Left: 12 lbs, and 3 point pinch: Right: 4 lbs, Left: 6 lbs  COORDINATION: Eval: N/A 02/02/23: 9 hole R: 42 sec; L: (unable) Able to pick up 1 peg and place in hole   SENSATION: WFL  EDEMA: TBD  MUSCLE TONE: LUE: Hypotonic  COGNITION: Overall cognitive status: Within functional limits for tasks assessed  VISION: Subjective report:  Pt. Reports having an Eye appointment Baseline vision: Wears glasses all the time  VISION ASSESSMENT: To be further assessed in functional context  PRAXIS: Impaired: Motor planning  TODAY'S TREATMENT:   Moist heat applied to R hand for pain reduction/muscle relaxation used for most of session while pt participated in LUE strengthening exercises.  Therapeutic Exercise:  Pt seen this date for PROM of left UE for shoulder flexion, ABD, ER/IR, elbow flexion/extension, forearm supination/pronation, wrist flexion/extension and digit flexion/extension followed by AAROM  for L chest press, shoulder abd, L shoulder IR/ER, elbow flex, elbow ext, forearm pron/sup 2 sets x10 reps each.  OT assist to stabilize LUE to reduce use of accessory muscle groups during LUE therapeutic exercises.  Pt performed 5 sets 10 reps with hand gripper with light resistance (1 red band) for each hand; assist for set up of gripper in each hand.  Facilitated L lateral pinch strengthening working to place and remove yellow and red light resistive clothespins from a small dowel. Dowel was positioned to facilitate slight active wrist extension while OT supported forearm and elbow to compensate for proximal weakness.                PATIENT EDUCATION: Education details: LUE strengthening Person educated: Patient Education method: explanation, vc, tactile cues Education comprehension: verbalized understanding  HOME  EXERCISE PROGRAM: Towel squeezes grip strengthening, seated marches and knee extension (lifting legs over tub)  GOALS: Goals reviewed with patient? Yes  SHORT TERM GOALS: Target date: 01/24/2023   Pt. Will demonstrate independence with HEPs for the LUE. Baseline: Eval: No current HEP; 02/02/23: Pt demos self passive stretching at the shoulder in a couple different planes (horiz add, flexion); pt has been encouraged to perform towel squeezes for grip strengthening, but pt reports she has not yet done this at home. Goal status: ongoing  LONG TERM GOALS: Target date: 03/07/2023  Pt. Will increase FOTO score by 2 points for Pt. perceived improvement with assessment specific ADL/IADL improvement. Baseline: Eval: FOTO 33, TR score: 39; 02/02/23: FOTO 44 Goal status: achieved/ongoing  2.  Pt. Will improve left shoulder ROM by 10 degrees to assist with self-dressing. Baseline: Left shoulder flexion: 0(93), Abduction: 30(83); 02/02/23: L shoulder flexion 0 (90), abd 35 (90) Goal status: ongoing  3.  Pt. Will improve left wrist extension by 10 in preparation for functional reaching Baseline:  Left wrist extension: 10(50); 02/02/23: L wrist ext 0 (58) Goal status: ongoing  4.  Pt. Will increase left grip strength to 30# or more in preparation for being able to securely stabilize ADL/IADL items at the tabletop. (revised from 5# on 02/02/23) Baseline: Left grip strength: 10#; 02/02/23: L 25# Goal status: revised/ongoing  5. Pt will perform tub transfer with leg lifter and transfer tub bench with supv/set up. Baseline: Min A with sit<>stand from tub bench, mod A to lift L leg over tub; 02/02/23: supv with sit<>stand from tub bench, min A to lift L leg over tub with use of leg lifter   Goal status: ongoing  6. Pt will perform UB bathing with min A.  Baseline: Mod A; 02/02/23: min A  Goal status: achieved  ASSESSMENT: CLINICAL IMPRESSION:  Pt reports it hurt too badly to put her shoes on this morning.  PT communicated to OT that pt has been having high pain levels today in L great toe and PT has informed MD.  Pt awaiting return phone call from MD office.  Pt tolerated LUE therapeutic exercises well this date.  Pt required OT assist to stabilize LUE to reduce use of accessory muscle groups during LUE therapeutic exercises.  Continue OT to facilitate increased active movement of left UE to improve independence in necessary daily tasks.     PERFORMANCE DEFICITS: in functional skills including ADLs, IADLs, coordination, dexterity, ROM, strength, pain, Fine motor control, and Gross motor control, cognitive skills including attention, problem solving, and safety awareness, and psychosocial skills including coping strategies, environmental adaptation, and routines and behaviors.   IMPAIRMENTS: are limiting patient from ADLs, IADLs, leisure, and social participation.   CO-MORBIDITIES: may have co-morbidities  that affects occupational performance. Patient will benefit from skilled OT to address above impairments and improve overall function.  MODIFICATION OR ASSISTANCE TO COMPLETE EVALUATION: Maximum  or significant modification of tasks or assist is necessary to complete an evaluation.  OT OCCUPATIONAL PROFILE AND HISTORY: Comprehensive assessment: Review of records and extensive additional review of physical, cognitive, psychosocial history related to current functional performance.  CLINICAL DECISION MAKING: High - multiple treatment options, significant modification of task necessary  REHAB POTENTIAL: Good  EVALUATION COMPLEXITY: High  PLAN:  OT FREQUENCY: 2x/week  OT DURATION: 12 weeks  PLANNED INTERVENTIONS: self care/ADL training, therapeutic exercise, therapeutic activity, neuromuscular re-education, manual therapy, passive range of motion, functional mobility training, electrical stimulation, and paraffin  RECOMMENDED OTHER SERVICES:  OT  CONSULTED AND AGREED WITH PLAN OF CARE: Patient  PLAN FOR NEXT SESSION: therapeutic exercises, ADL/AE training   Danelle Earthly, MS, OTR/L  02/07/2023, 3:05 PM

## 2023-02-09 ENCOUNTER — Ambulatory Visit: Payer: 59 | Admitting: Occupational Therapy

## 2023-02-14 ENCOUNTER — Ambulatory Visit: Payer: 59

## 2023-02-14 DIAGNOSIS — M6281 Muscle weakness (generalized): Secondary | ICD-10-CM | POA: Diagnosis not present

## 2023-02-14 DIAGNOSIS — R278 Other lack of coordination: Secondary | ICD-10-CM

## 2023-02-14 DIAGNOSIS — M79641 Pain in right hand: Secondary | ICD-10-CM

## 2023-02-14 DIAGNOSIS — R262 Difficulty in walking, not elsewhere classified: Secondary | ICD-10-CM

## 2023-02-14 DIAGNOSIS — R2681 Unsteadiness on feet: Secondary | ICD-10-CM

## 2023-02-14 NOTE — Therapy (Signed)
OUTPATIENT PHYSICAL THERAPY NEURO TREATMENT NOTE    Patient Name: Karil Araque MRN: [id number] DOB:22-May-1951, 72 y.o., female Today's Date: 02/14/2023   PCP: Bobbye Morton MD REFERRING PROVIDER: Louis Matte MD   END OF SESSION:  PT End of Session - 02/14/23 1541     Visit Number 12    Number of Visits 24    Date for PT Re-Evaluation 02/28/23    Authorization Type 1/10 eval 3/5    PT Start Time 0932    PT Stop Time 1013    PT Time Calculation (min) 41 min    Equipment Utilized During Treatment Gait belt    Activity Tolerance Patient tolerated treatment well;No increased pain    Behavior During Therapy WFL for tasks assessed/performed                  Past Medical History:  Diagnosis Date   Diabetes mellitus without complication (HCC)    Hypertension    Stroke [facility])    Past Surgical History:  Procedure Laterality Date   ABDOMINAL HYSTERECTOMY     COLONOSCOPY N/A 10/06/2019   Procedure: COLONOSCOPY;  Surgeon: Toledo, Boykin Nearing, MD;  Location: [facility] ENDOSCOPY;  Service: Gastroenterology;  Laterality: N/A;   Patient Active Problem List   Diagnosis Date Noted   Hypertension    Diabetes mellitus without complication (HCC)    Stroke (HCC)    GIB (gastrointestinal bleeding)    AKI (acute kidney injury) (HCC)    UTI (urinary tract infection)     ONSET DATE: 2020  REFERRING DIAG: 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   THERAPY DIAG:  Muscle weakness (generalized)  Other lack of coordination  Difficulty in walking, not elsewhere classified  Unsteadiness on feet  Rationale for Evaluation and Treatment: Rehabilitation  SUBJECTIVE:                                                                                                                                                                                             SUBJECTIVE STATEMENT: Pt reports she followed-up with her physician and reports they did not  feel she had a clot in her LE but did refer her to a rheumatologist. Pt presents with improved swelling today, able to flex her L knee. Pt reports she also met with nursing and wants a boot/brace for pt's drop foot. Pt reports recent fall while walking where her knee buckled. Someone helped her get back up. She reports she didn't hurt herself. Pt reports her L toe Is still hurting, but UE pain has improved some. 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 Stroke was four years ago, has had multiple strokes. Had a little bit of therapy, afterwards in [place]. Moved up to [place] about three years ago. [name] includes DM with diabetic neuropathy, stroke, HTN, AKI, UTI. Uses a walker to walk to the bathroom (two wheel and three wheel walkers).  Has a power chair for out of the house.   PAIN:  Are you having pain? Yes: NPRS scale:  /10 Pain location: L toe pain Pain description: aching Aggravating factors: standing, moving Relieving factors: nothing known.   PRECAUTIONS: Fall  WEIGHT BEARING RESTRICTIONS: No  FALLS: Has patient fallen in last 6 months? No  LIVING ENVIRONMENT: Lives with: lives with their family Lives in: House/apartment Stairs:  ramp outside Has following equipment at home: [name] - 2 wheeled, Environmental consultant - 4 wheeled, Wheelchair (power), shower chair, Ramped entry, and chair lift and hospital bed   PLOF: Independent  PATIENT GOALS: get up and walk by herself, wants to walk with a cane.   OBJECTIVE:   DIAGNOSTIC FINDINGS: CVA in [place]; no imaging in system  COGNITION: Overall cognitive status:  a little bit per granddaughter.    SENSATION: WFL  COORDINATION: Limited LLE     POSTURE: rounded shoulders, forward head, flexed trunk , and weight shift right    LOWER EXTREMITY MMT:    MMT Right Eval Left Eval  Hip flexion 4 2  Hip extension    Hip  abduction 4 2+  Hip adduction 4 2+  Knee flexion 4 2-  Knee extension 4 2+  Ankle dorsiflexion 3   Ankle plantarflexion 3   (Blank rows = not tested)   TRANSFERS: Assistive device utilized: Environmental consultant - 2 wheeled  Sit to stand: CGA and Min A; blocking of L foot  Stand to sit: CGA Chair to chair: Min A    GAIT: Gait pattern: step to pattern, decreased stance time- Left, decreased ankle dorsiflexion- Left, and poor foot clearance- Left Distance walked: 22ft Assistive device utilized: Environmental consultant - 2 wheeled Level of assistance: CGA Comments: L knee hyperextension with weightbearing  FUNCTIONAL TESTS:  5 times sit to stand: 38 seconds with SUE support  10 meter walk test: 1 min 43 seconds with RW  PATIENT SURVEYS:  FOTO 46  TODAY'S TREATMENT:                                                                                                                              DATE: 02/14/23   TE: Gait belt donned and CGA provided unless specified otherwise  Gait in // bars for endurance/LE strength FWD and with turns 3x length of // bars. Rates medium. Close CGA provided throughout, cuing for upright posture, TC to assist with knee buckling. Noted hyperext in LLE.  STS 10x with cuing for anterior weight-shift, positioning in chair, LE positioning. Use of yoga block between feet  to promote correct LE positioning. Pull-to-stand. Pt rates hard. -- Pt completes additional 5 reps of pull-to-stand, multiple attempts of push-to-stand but pt unable to complete with this technique   2.5# AW donned RLE, no AW LLE: LAQ 3x10 each LE  March 2x10 each LE  Attempt ambulating to nustep with HW/attempt and close CGA, also attempt standing pivot transfer close CGA, however, pt knee too unstable today to safely transfer and intervention terminated.    PATIENT EDUCATION: Education details: Pt educated throughout session about proper posture and technique with exercises. Improved exercise technique, movement  at target joints, use of target muscles after min to mod verbal, visual, tactile cues.   Person educated: Patient and granddaughter Education method: Explanation, Demonstration, Tactile cues, and Verbal cues Education comprehension: verbalized understanding, returned demonstration, verbal cues required, tactile cues required, and needs further education  HOME EXERCISE PROGRAM: Access Code: [email or web address] URL: [email or web address] Date: 12/06/2022 Prepared by: Precious Bard  Exercises - Leg Extension  - 1 x daily - 7 x weekly - 2 sets - 10 reps - 5 hold - Seated March  - 1 x daily - 7 x weekly - 2 sets - 10 reps - 5 hold - Seated Hip Abduction  - 1 x daily - 7 x weekly - 2 sets - 10 reps - 5 hold - Seated Hip Adduction Isometrics with Ball  - 1 x daily - 7 x weekly - 2 sets - 10 reps - 5 hold - Seated Weight Shifting Without Arm Support  - 1 x daily - 7 x weekly - 2 sets - 10 reps - 5 hold   GOALS: Goals reviewed with patient? Yes  SHORT TERM GOALS: Target date: 01/03/2023    Patient will be independent in home exercise program to improve strength/mobility for better functional independence with ADLs. Baseline: 01/26/2023: pt has some difficulty with HEP Goal status: IN PROGRESS    LONG TERM GOALS: Target date: 02/28/2023    Patient will increase FOTO score to equal to or greater than 53    to demonstrate statistically significant improvement in mobility and quality of life.  Baseline: 3/5: 46%; 01/26/23: 45%  Goal status: IN PROGRESS  2.  Patient (> 19 years old) will complete five times sit to stand test in < 15 seconds indicating an increased LE strength and improved balance. Baseline: 3/5: 38 seconds with SUE support; blocking of LLE; 01/26/23 18 seconds with pulling to stand Goal status: IN PROGRESS  3.  Patient will increase 10 meter walk test to >1.67m/s as to improve gait speed for better community ambulation and to reduce fall risk Baseline: 3/5: 1 min 43 seconds  with RW and w/c follow; 01/26/2023: 2 min 6 seconds  Goal status: IN PROGRESS  4.  Patient will increase BLE gross strength to 4/5 as to improve functional strength for independent gait, increased standing tolerance and increased ADL ability. Baseline: 3/5: grossly 2/5 LLE; 4//25/24: RLE grossly 4-/5, LLE grossly 2/5  Goal status: IN PROGRESS    ASSESSMENT:  CLINICAL IMPRESSION: Pt returns having followed-up with physician regarding LLE swelling, pt reports has been referred to rheumatology. Pt exhibits decreased swelling in LLE and improved mobility, where pt now able to flex L knee. However, interventions still somewhat limited as pt with decrease in gait stability. PT sending in form to physician for LLE AFO to address this.  The patient will benefit from skilled physical therapy to improve function, mobility, and strength.  OBJECTIVE IMPAIRMENTS: Abnormal gait, cardiopulmonary status limiting  activity, decreased activity tolerance, decreased balance, decreased coordination, decreased endurance, decreased knowledge of use of DME, decreased mobility, difficulty walking, decreased ROM, decreased strength, impaired perceived functional ability, impaired flexibility, impaired UE functional use, improper body mechanics, postural dysfunction, obesity, and pain.   ACTIVITY LIMITATIONS: carrying, lifting, bending, sitting, standing, squatting, sleeping, stairs, transfers, bed mobility, bathing, toileting, dressing, reach over head, and caring for others  PARTICIPATION LIMITATIONS: meal prep, cleaning, laundry, interpersonal relationship, driving, shopping, community activity, and church  PERSONAL FACTORS: Age, Behavior pattern, Education, Fitness, Past/current experiences, Profession, Sex, Time since onset of injury/illness/exacerbation, and 3+ comorbidities: DM with diabetic neuropathy, stroke, HTN, AKI, UTI  are also affecting patient's functional outcome.   REHAB POTENTIAL: Fair length of time  since CVA  CLINICAL DECISION MAKING: Evolving/moderate complexity  EVALUATION COMPLEXITY: Moderate  PLAN:  PT FREQUENCY: 2x/week  PT DURATION: 12 weeks  PLANNED INTERVENTIONS: Therapeutic exercises, Therapeutic activity, Neuromuscular re-education, Balance training, Gait training, Patient/Family education, Self Care, Joint mobilization, Stair training, Vestibular training, Canalith repositioning, Visual/preceptual remediation/compensation, Orthotic/Fit training, DME instructions, Cognitive remediation, Electrical stimulation, Wheelchair mobility training, Spinal mobilization, Cryotherapy, Moist heat, Splintting, Taping, Traction, Ultrasound, Manual therapy, and Re-evaluation  PLAN FOR NEXT SESSION: stand in // bars, work on walking, strengthening LLE.  Baird Kay, PT 02/14/2023, 3:47 PM

## 2023-02-14 NOTE — Therapy (Signed)
OUTPATIENT OCCUPATIONAL THERAPY NEURO TREATMENT NOTE  Patient Name: Dana Lucas MRN: [id number] DOB:Oct 20, 1950, 72 y.o., female   PCP: Hinda Lenis, MD REFERRING PROVIDER: Sumner Boast, MD  END OF SESSION:  OT End of Session - 02/14/23 1646     Visit Number 12    Number of Visits 24    Date for OT Re-Evaluation 03/07/23    Authorization Type Progress report period starting 02/02/23    Progress Note Due on Visit 10    OT Start Time 1020    OT Stop Time 1100    OT Time Calculation (min) 40 min    Equipment Utilized During Treatment power wc    Activity Tolerance Patient tolerated treatment well    Behavior During Therapy WFL for tasks assessed/performed            Past Medical History:  Diagnosis Date   Diabetes mellitus without complication (HCC)    Hypertension    Stroke [facility])    Past Surgical History:  Procedure Laterality Date   ABDOMINAL HYSTERECTOMY     COLONOSCOPY N/A 10/06/2019   Procedure: COLONOSCOPY;  Surgeon: Toledo, Boykin Nearing, MD;  Location: [facility] ENDOSCOPY;  Service: Gastroenterology;  Laterality: N/A;   Patient Active Problem List   Diagnosis Date Noted   Hypertension    Diabetes mellitus without complication (HCC)    Stroke (HCC)    GIB (gastrointestinal bleeding)    AKI (acute kidney injury) (HCC)    UTI (urinary tract infection)    ONSET DATE: 05/2014  REFERRING DIAG: CVA  THERAPY DIAG:  Muscle weakness (generalized)  Other lack of coordination  Pain in right hand  Rationale for Evaluation and Treatment: Rehabilitation  SUBJECTIVE:   SUBJECTIVE STATEMENT: Pt reports doing well today.  Pt stated that she is no longer drinking caffeine, and when she drinks her coffee she gets decaf to help with decreasing urinary frequency at night time.  Pt accompanied by: family member grand daughter  PERTINENT HISTORY: Pt. is a 72 y.o. female who has a history of multiple CVAs beginning in 57. Pt. Has Hemiplegia/Hemiparesis 2/2 CVA  affecting the Left nondominant side.   PRECAUTIONS: None  WEIGHT BEARING RESTRICTIONS: No  PAIN:  Are you having pain? 4-5/10 right hand arthritic pain; improves with moist heat during session FALLS: Has patient fallen in last 6 months? No  LIVING ENVIRONMENT: Lives with: lives with their family and lives with their son Lives in: House/apartment Stairs: one story, ramped entrance Has following equipment at home: Wheelchair (power) and Wheelchair (manual), 2 wheeled walker, 3 wheeled walker, power lift, hospital bed, transfer shower bench  PLOF: Independent  PATIENT GOALS: To be able to walk again, and to drive  OBJECTIVE:   HAND DOMINANCE: Right  ADLs: Transfers/ambulation related to ADLs: Eating: Independent Grooming: Independent UB Dressing: Independent LB Dressing: Independent pants, MaxA shoes, and socks Toileting: Assist with transfers, reports independence with toileting care Bathing: Assist from DIL Tub Shower transfers: Min-ModA shower transfers Equipment: See above  IADLs: Shopping: DIL performs shopping Light housekeeping: Unable Meal Prep: Pt. Is able to retrieve a beverage, or snack, light meal prep Community mobility: Power w/c Medication management: Grand daughter assists with pillbox set-up.  Pt. Independently takes medication Financial management: No changes Handwriting: Name: 100% legible in printed form, 75% in cursive form  MOBILITY STATUS: Needs Assist: Uses power w/c  POSTURE COMMENTS:  Sitting balance: WFL supported   FUNCTIONAL OUTCOME MEASURES: FOTO: 33 TR:39 02/02/23: 44  UPPER EXTREMITY ROM:    Active  ROM Right WFL Left eval Left 02/02/23  Shoulder flexion  0(93) 0 (90)  Shoulder abduction  30(83) 35 (90)   Shoulder adduction     Shoulder extension     Shoulder internal rotation     Shoulder external rotation     Elbow flexion  0-87(0-122) 80  Elbow extension   -40  Wrist flexion  52 82  Wrist extension  10(50) 0 (58)  Wrist  ulnar deviation     Wrist radial deviation     Wrist pronation     Wrist supination     (Blank rows = not tested)  Full Digit flexion to the [facility] Active digit extension through 75% of the range grossly Active thumb radial, and palmar abduction  UPPER EXTREMITY MMT:     MMT Right eval Left eval Left 02/02/23  Shoulder flexion  0/5 0  Shoulder abduction  2-/5 2-  Shoulder adduction     Shoulder extension     Shoulder internal rotation     Shoulder external rotation     Middle trapezius     Lower trapezius     Elbow flexion  3/5 3-  Elbow extension     Wrist flexion     Wrist extension  2/5 2  Wrist ulnar deviation     Wrist radial deviation     Wrist pronation     Wrist supination     (Blank rows = not tested)  HAND FUNCTION: Grip strength: Right: 18 lbs; Left: 10 lbs, Lateral pinch: Right: 5 lbs, Left: 11 lbs, and 3 point pinch: Right: 1 lbs, Left: 15 lbs 02/02/23: Grip strength: Right: 8 lbs (limited by pain); Left: 25 lbs, Lateral pinch: Right: 5 lbs, Left: 12 lbs, and 3 point pinch: Right: 4 lbs, Left: 6 lbs  COORDINATION: Eval: N/A 02/02/23: 9 hole R: 42 sec; L: (unable) Able to pick up 1 peg and place in hole   SENSATION: WFL  EDEMA: TBD  MUSCLE TONE: LUE: Hypotonic  COGNITION: Overall cognitive status: Within functional limits for tasks assessed  VISION: Subjective report:  Pt. Reports having an Eye appointment Baseline vision: Wears glasses all the time  VISION ASSESSMENT: To be further assessed in functional context  PRAXIS: Impaired: Motor planning  TODAY'S TREATMENT:   Moist heat applied to R hand for pain reduction/muscle relaxation used for most of session while pt participated in LUE strengthening exercises.  Therapeutic Exercise:  Pt seen this date for PROM of left UE for shoulder flexion, ABD, ER/IR, elbow flexion/extension, forearm supination/pronation, wrist flexion/extension and digit flexion/extension followed by AAROM for L chest press,  shoulder elevation 0-90, shoulder abd 0-90, L shoulder IR/ER, elbow flex, elbow ext, forearm pron/sup 2 sets x10 reps each.  OT assist to stabilize LUE to reduce use of accessory muscle groups during LUE therapeutic exercises.  Pt performed 3 sets 10 reps with hand gripper with light resistance (1 red band) for L hand only; assist for set up of gripper in hand.  Provided options to obtain a hand gripper for home per pt request.  Facilitated L lateral pinch strengthening working to place and remove yellow and red light resistive clothespins from a small dowel.  Dowel was positioned to facilitate slight active wrist extension while OT supported forearm and elbow to compensate for proximal weakness.  Pt practiced reps of pencil rolls to promote active digit extension; pt began with closed fist resting palm down on table top.  OT provided tactile cue to L wrist and  forearm to anchor arm in order to isolate digit extension and prevent pt pushing pencil forward using proximal strength.  PATIENT EDUCATION: Education details: LUE strengthening Person educated: Patient Education method: explanation, vc, tactile cues Education comprehension: verbalized understanding  HOME EXERCISE PROGRAM: Towel squeezes grip strengthening, seated marches and knee extension (lifting legs over tub)  GOALS: Goals reviewed with patient? Yes  SHORT TERM GOALS: Target date: 01/24/2023   Pt. Will demonstrate independence with HEPs for the LUE. Baseline: Eval: No current HEP; 02/02/23: Pt demos self passive stretching at the shoulder in a couple different planes (horiz add, flexion); pt has been encouraged to perform towel squeezes for grip strengthening, but pt reports she has not yet done this at home. Goal status: ongoing  LONG TERM GOALS: Target date: 03/07/2023  Pt. Will increase FOTO score by 2 points for Pt. perceived improvement with assessment specific ADL/IADL improvement. Baseline: Eval: FOTO 33, TR score: 39; 02/02/23:  FOTO 44 Goal status: achieved/ongoing  2.  Pt. Will improve left shoulder ROM by 10 degrees to assist with self-dressing. Baseline: Left shoulder flexion: 0(93), Abduction: 30(83); 02/02/23: L shoulder flexion 0 (90), abd 35 (90) Goal status: ongoing  3.  Pt. Will improve left wrist extension by 10 in preparation for functional reaching Baseline: Left wrist extension: 10(50); 02/02/23: L wrist ext 0 (58) Goal status: ongoing  4.  Pt. Will increase left grip strength to 30# or more in preparation for being able to securely stabilize ADL/IADL items at the tabletop. (revised from 5# on 02/02/23) Baseline: Left grip strength: 10#; 02/02/23: L 25# Goal status: revised/ongoing  5. Pt will perform tub transfer with leg lifter and transfer tub bench with supv/set up. Baseline: Min A with sit<>stand from tub bench, mod A to lift L leg over tub; 02/02/23: supv with sit<>stand from tub bench, min A to lift L leg over tub with use of leg lifter   Goal status: ongoing  6. Pt will perform UB bathing with min A.  Baseline: Mod A; 02/02/23: min A  Goal status: achieved  ASSESSMENT: CLINICAL IMPRESSION:  Pt stated that she is no longer drinking caffeine, and when she drinks her coffee she gets decaf to help with decreasing urinary frequency at night time.  Pt stated that she got up 3x last night to urinate, but sometimes gets up 5 times, which causes drowsiness during the day.  Pt still drowsy today, but more so during last 10 min of session, requiring frequent prompting to maintain level of alertness and attention to task.  Pt reported that she would be ready to go home and take a nap after therapy, as she continues to struggle with good sleep at night time.  Pt tolerated LUE therapeutic exercises fairly well this date, considering drowsiness at end of session, but good participation initially.   Pt continues to require OT assist to stabilize LUE to reduce use of accessory muscle groups during LUE therapeutic  exercises.  Pt reports that she feels she's getting stronger, and notes improved ability to transfer in and out of bed.  Continue OT to facilitate increased active movement of left UE to improve independence in necessary daily tasks.     PERFORMANCE DEFICITS: in functional skills including ADLs, IADLs, coordination, dexterity, ROM, strength, pain, Fine motor control, and Gross motor control, cognitive skills including attention, problem solving, and safety awareness, and psychosocial skills including coping strategies, environmental adaptation, and routines and behaviors.   IMPAIRMENTS: are limiting patient from ADLs, IADLs, leisure, and  social participation.   CO-MORBIDITIES: may have co-morbidities  that affects occupational performance. Patient will benefit from skilled OT to address above impairments and improve overall function.  MODIFICATION OR ASSISTANCE TO COMPLETE EVALUATION: Maximum or significant modification of tasks or assist is necessary to complete an evaluation.  OT OCCUPATIONAL PROFILE AND HISTORY: Comprehensive assessment: Review of records and extensive additional review of physical, cognitive, psychosocial history related to current functional performance.  CLINICAL DECISION MAKING: High - multiple treatment options, significant modification of task necessary  REHAB POTENTIAL: Good  EVALUATION COMPLEXITY: High  PLAN:  OT FREQUENCY: 2x/week  OT DURATION: 12 weeks  PLANNED INTERVENTIONS: self care/ADL training, therapeutic exercise, therapeutic activity, neuromuscular re-education, manual therapy, passive range of motion, functional mobility training, electrical stimulation, and paraffin  RECOMMENDED OTHER SERVICES: OT  CONSULTED AND AGREED WITH PLAN OF CARE: Patient  PLAN FOR NEXT SESSION: therapeutic exercises, ADL/AE training   Danelle Earthly, MS, OTR/L  02/14/2023, 4:47 PM

## 2023-02-16 ENCOUNTER — Ambulatory Visit: Payer: 59

## 2023-02-17 ENCOUNTER — Ambulatory Visit: Payer: 59

## 2023-02-17 ENCOUNTER — Ambulatory Visit: Payer: 59 | Admitting: Physical Therapy

## 2023-02-17 DIAGNOSIS — M6281 Muscle weakness (generalized): Secondary | ICD-10-CM

## 2023-02-17 DIAGNOSIS — R2681 Unsteadiness on feet: Secondary | ICD-10-CM

## 2023-02-17 DIAGNOSIS — R278 Other lack of coordination: Secondary | ICD-10-CM

## 2023-02-17 DIAGNOSIS — R262 Difficulty in walking, not elsewhere classified: Secondary | ICD-10-CM

## 2023-02-17 NOTE — Therapy (Signed)
OUTPATIENT PHYSICAL THERAPY NEURO TREATMENT NOTE    Patient Name: Dana Lucas MRN: [id number] DOB:1951-03-06, 72 y.o., female Today's Date: 02/17/2023   PCP: Bobbye Morton MD REFERRING PROVIDER: Louis Matte MD   END OF SESSION:  PT End of Session - 02/17/23 0843     Visit Number 13    Number of Visits 24    Date for PT Re-Evaluation 02/28/23    Authorization Type 1/10 eval 3/5    PT Start Time 0847    PT Stop Time 0930    PT Time Calculation (min) 43 min    Equipment Utilized During Treatment Gait belt    Activity Tolerance Patient tolerated treatment well;No increased pain    Behavior During Therapy WFL for tasks assessed/performed                  Past Medical History:  Diagnosis Date   Diabetes mellitus without complication (HCC)    Hypertension    Stroke [facility])    Past Surgical History:  Procedure Laterality Date   ABDOMINAL HYSTERECTOMY     COLONOSCOPY N/A 10/06/2019   Procedure: COLONOSCOPY;  Surgeon: Toledo, Boykin Nearing, MD;  Location: [facility] ENDOSCOPY;  Service: Gastroenterology;  Laterality: N/A;   Patient Active Problem List   Diagnosis Date Noted   Hypertension    Diabetes mellitus without complication (HCC)    Stroke (HCC)    GIB (gastrointestinal bleeding)    AKI (acute kidney injury) (HCC)    UTI (urinary tract infection)     ONSET DATE: 2020  REFERRING DIAG: 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   THERAPY DIAG:  No diagnosis found.  Rationale for Evaluation and Treatment: Rehabilitation  SUBJECTIVE:                                                                                                                                                                                             SUBJECTIVE STATEMENT: Pt reports similar concerns since last visit. Presents with documentation from some kind of appointment to be scanned into chart.   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 Stroke was four years ago, has had multiple strokes. Had a little bit of therapy, afterwards in [place]. Moved up to [place] about three years ago. [name] includes DM with diabetic neuropathy, stroke, HTN, AKI, UTI. Uses a walker to walk to the bathroom (two wheel and three wheel walkers).  Has a power chair for out of the house.   PAIN:  Are you  having pain? Yes: NPRS scale:  /10 Pain location: L toe pain Pain description: aching Aggravating factors: standing, moving Relieving factors: nothing known.   PRECAUTIONS: Fall  WEIGHT BEARING RESTRICTIONS: No  FALLS: Has patient fallen in last 6 months? No  LIVING ENVIRONMENT: Lives with: lives with their family Lives in: House/apartment Stairs:  ramp outside Has following equipment at home: [name] - 2 wheeled, Environmental consultant - 4 wheeled, Wheelchair (power), shower chair, Ramped entry, and chair lift and hospital bed   PLOF: Independent  PATIENT GOALS: get up and walk by herself, wants to walk with a cane.   OBJECTIVE:   DIAGNOSTIC FINDINGS: CVA in [place]; no imaging in system  COGNITION: Overall cognitive status:  a little bit per granddaughter.    SENSATION: WFL  COORDINATION: Limited LLE     POSTURE: rounded shoulders, forward head, flexed trunk , and weight shift right    LOWER EXTREMITY MMT:    MMT Right Eval Left Eval  Hip flexion 4 2  Hip extension    Hip abduction 4 2+  Hip adduction 4 2+  Knee flexion 4 2-  Knee extension 4 2+  Ankle dorsiflexion 3   Ankle plantarflexion 3   (Blank rows = not tested)   TRANSFERS: Assistive device utilized: Environmental consultant - 2 wheeled  Sit to stand: CGA and Min A; blocking of L foot  Stand to sit: CGA Chair to chair: Min A    GAIT: Gait pattern: step to pattern, decreased stance time- Left, decreased ankle dorsiflexion- Left, and poor foot clearance- Left Distance walked:  67ft Assistive device utilized: Environmental consultant - 2 wheeled Level of assistance: CGA Comments: L knee hyperextension with weightbearing  FUNCTIONAL TESTS:  5 times sit to stand: 38 seconds with SUE support  10 meter walk test: 1 min 43 seconds with RW  PATIENT SURVEYS:  FOTO 46  TODAY'S TREATMENT:                                                                                                                              DATE: 02/17/23  BP assessment: 140/70  TE:  3# AW donned RLE, no AW LLE: LAQ 3x10 each LE  March 3x10 each LE LLE hip adduction with PT blocking R LE 3 x 10  Hip abduction with band LLE focus 2 x 10 GTB  Hip extension with GTB in seated 2 x 10   Gait belt donned and CGA provided unless specified otherwise  X 5 repetitions with heavy pull upper extremity assist.  Physical therapist provides cues for patient to unlock left knee in standing in order to improve ease of performing stand to sit transition.  Completes 5 repetitions followed by rest. Physical therapist places 2 inch Thera-Band pad underneath patient's right lower extremity in order to increase left lower extremity load during sit to stand transition.  Was not altogether successful and this is patient continues to just rely heavier on the right lower extremity to  propel herself in the standing has more difficulty with unlocking me due to significant right lower extremity weightbearing with 2 inch pad.       PATIENT EDUCATION: Education details: Pt educated throughout session about proper posture and technique with exercises. Improved exercise technique, movement at target joints, use of target muscles after min to mod verbal, visual, tactile cues.   Person educated: Patient and granddaughter Education method: Explanation, Demonstration, Tactile cues, and Verbal cues Education comprehension: verbalized understanding, returned demonstration, verbal cues required, tactile cues required, and needs further  education  HOME EXERCISE PROGRAM: Access Code: [email or web address] URL: [email or web address] Date: 12/06/2022 Prepared by: Precious Bard  Exercises - Leg Extension  - 1 x daily - 7 x weekly - 2 sets - 10 reps - 5 hold - Seated March  - 1 x daily - 7 x weekly - 2 sets - 10 reps - 5 hold - Seated Hip Abduction  - 1 x daily - 7 x weekly - 2 sets - 10 reps - 5 hold - Seated Hip Adduction Isometrics with Ball  - 1 x daily - 7 x weekly - 2 sets - 10 reps - 5 hold - Seated Weight Shifting Without Arm Support  - 1 x daily - 7 x weekly - 2 sets - 10 reps - 5 hold   GOALS: Goals reviewed with patient? Yes  SHORT TERM GOALS: Target date: 01/03/2023    Patient will be independent in home exercise program to improve strength/mobility for better functional independence with ADLs. Baseline: 01/26/2023: pt has some difficulty with HEP Goal status: IN PROGRESS    LONG TERM GOALS: Target date: 02/28/2023    Patient will increase FOTO score to equal to or greater than 53    to demonstrate statistically significant improvement in mobility and quality of life.  Baseline: 3/5: 46%; 01/26/23: 45%  Goal status: IN PROGRESS  2.  Patient (> 61 years old) will complete five times sit to stand test in < 15 seconds indicating an increased LE strength and improved balance. Baseline: 3/5: 38 seconds with SUE support; blocking of LLE; 01/26/23 18 seconds with pulling to stand Goal status: IN PROGRESS  3.  Patient will increase 10 meter walk test to >1.52m/s as to improve gait speed for better community ambulation and to reduce fall risk Baseline: 3/5: 1 min 43 seconds with RW and w/c follow; 01/26/2023: 2 min 6 seconds  Goal status: IN PROGRESS  4.  Patient will increase BLE gross strength to 4/5 as to improve functional strength for independent gait, increased standing tolerance and increased ADL ability. Baseline: 3/5: grossly 2/5 LLE; 4//25/24: RLE grossly 4-/5, LLE grossly 2/5  Goal status: IN  PROGRESS    ASSESSMENT:  CLINICAL IMPRESSION: Patient presents with good motivation for completion of physical therapy activities.  Patient still having increased difficulty with left lower extremity weightbearing and still has tendency to lock left knee into extension or to provide increased ability.  With cues patient is able to unlock left lower extremity for increased ease of transfers and increase safety with transfers but still relies heavily on upper extremity assist.  When trying to isolate left lower extremity with closing exercises patient still has tendency to work her right noninvolved side to compensate for left-sided deficits.Pt will continue to benefit from skilled physical therapy intervention to address impairments, improve QOL, and attain therapy goals.    OBJECTIVE IMPAIRMENTS: Abnormal gait, cardiopulmonary status limiting activity, decreased activity tolerance, decreased balance, decreased coordination, decreased endurance, decreased  knowledge of use of DME, decreased mobility, difficulty walking, decreased ROM, decreased strength, impaired perceived functional ability, impaired flexibility, impaired UE functional use, improper body mechanics, postural dysfunction, obesity, and pain.   ACTIVITY LIMITATIONS: carrying, lifting, bending, sitting, standing, squatting, sleeping, stairs, transfers, bed mobility, bathing, toileting, dressing, reach over head, and caring for others  PARTICIPATION LIMITATIONS: meal prep, cleaning, laundry, interpersonal relationship, driving, shopping, community activity, and church  PERSONAL FACTORS: Age, Behavior pattern, Education, Fitness, Past/current experiences, Profession, Sex, Time since onset of injury/illness/exacerbation, and 3+ comorbidities: DM with diabetic neuropathy, stroke, HTN, AKI, UTI  are also affecting patient's functional outcome.   REHAB POTENTIAL: Fair length of time since CVA  CLINICAL DECISION MAKING: Evolving/moderate  complexity  EVALUATION COMPLEXITY: Moderate  PLAN:  PT FREQUENCY: 2x/week  PT DURATION: 12 weeks  PLANNED INTERVENTIONS: Therapeutic exercises, Therapeutic activity, Neuromuscular re-education, Balance training, Gait training, Patient/Family education, Self Care, Joint mobilization, Stair training, Vestibular training, Canalith repositioning, Visual/preceptual remediation/compensation, Orthotic/Fit training, DME instructions, Cognitive remediation, Electrical stimulation, Wheelchair mobility training, Spinal mobilization, Cryotherapy, Moist heat, Splintting, Taping, Traction, Ultrasound, Manual therapy, and Re-evaluation  PLAN FOR NEXT SESSION: stand in // bars, work on walking, strengthening LLE.  Norman Herrlich, PT 02/17/2023, 11:28 AM

## 2023-02-19 NOTE — Therapy (Signed)
OUTPATIENT OCCUPATIONAL THERAPY NEURO TREATMENT NOTE  Patient Name: Dana Lucas MRN: [id number] DOB:20-Sep-1951, 72 y.o., female   PCP: Hinda Lenis, MD REFERRING PROVIDER: Sumner Boast, MD  END OF SESSION:  OT End of Session - 02/19/23 1252     Visit Number 13    Number of Visits 24    Date for OT Re-Evaluation 03/07/23    Authorization Type Progress report period starting 02/02/23    Progress Note Due on Visit 10    OT Start Time 0930    OT Stop Time 1015    OT Time Calculation (min) 45 min    Equipment Utilized During Treatment power wc    Activity Tolerance Patient tolerated treatment well    Behavior During Therapy WFL for tasks assessed/performed            Past Medical History:  Diagnosis Date   Diabetes mellitus without complication (HCC)    Hypertension    Stroke [facility])    Past Surgical History:  Procedure Laterality Date   ABDOMINAL HYSTERECTOMY     COLONOSCOPY N/A 10/06/2019   Procedure: COLONOSCOPY;  Surgeon: Toledo, Boykin Nearing, MD;  Location: [facility] ENDOSCOPY;  Service: Gastroenterology;  Laterality: N/A;   Patient Active Problem List   Diagnosis Date Noted   Hypertension    Diabetes mellitus without complication (HCC)    Stroke (HCC)    GIB (gastrointestinal bleeding)    AKI (acute kidney injury) (HCC)    UTI (urinary tract infection)    ONSET DATE: 05/2014  REFERRING DIAG: CVA  THERAPY DIAG:  Muscle weakness (generalized)  Other lack of coordination  Rationale for Evaluation and Treatment: Rehabilitation  SUBJECTIVE:   SUBJECTIVE STATEMENT: Pt reports doing well today.   Pt accompanied by: family member grand daughter  PERTINENT HISTORY: Pt. is a 72 y.o. female who has a history of multiple CVAs beginning in 31. Pt. 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   PRECAUTIONS: None  WEIGHT BEARING RESTRICTIONS: No  PAIN:  Are you having pain? 4-5/10 right hand arthritic pain; improves with moist heat  during session FALLS: Has patient fallen in last 6 months? No  LIVING ENVIRONMENT: Lives with: lives with their family and lives with their son Lives in: House/apartment Stairs: one story, ramped entrance Has following equipment at home: Wheelchair (power) and Wheelchair (manual), 2 wheeled walker, 3 wheeled walker, power lift, hospital bed, transfer shower bench  PLOF: Independent  PATIENT GOALS: To be able to walk again, and to drive  OBJECTIVE:   HAND DOMINANCE: Right  ADLs: Transfers/ambulation related to ADLs: Eating: Independent Grooming: Independent UB Dressing: Independent LB Dressing: Independent pants, MaxA shoes, and socks Toileting: Assist with transfers, reports independence with toileting care Bathing: Assist from DIL Tub Shower transfers: Min-ModA shower transfers Equipment: See above  IADLs: Shopping: DIL performs shopping Light housekeeping: Unable Meal Prep: Pt. Is able to retrieve a beverage, or snack, light meal prep Community mobility: Power w/c Medication management: Grand daughter assists with pillbox set-up.  Pt. Independently takes medication Financial management: No changes Handwriting: Name: 100% legible in printed form, 75% in cursive form  MOBILITY STATUS: Needs Assist: Uses power w/c  POSTURE COMMENTS:  Sitting balance: WFL supported   FUNCTIONAL OUTCOME MEASURES: FOTO: 33 TR:39 02/02/23: 44  UPPER EXTREMITY ROM:    Active ROM Right WFL Left eval Left 02/02/23  Shoulder flexion  0(93) 0 (90)  Shoulder abduction  30(83) 35 (90)   Shoulder adduction     Shoulder extension  Shoulder internal rotation     Shoulder external rotation     Elbow flexion  0-87(0-122) 80  Elbow extension   -40  Wrist flexion  52 82  Wrist extension  10(50) 0 (58)  Wrist ulnar deviation     Wrist radial deviation     Wrist pronation     Wrist supination     (Blank rows = not tested)  Full Digit flexion to the [facility] Active digit extension through  75% of the range grossly Active thumb radial, and palmar abduction  UPPER EXTREMITY MMT:     MMT Right eval Left eval Left 02/02/23  Shoulder flexion  0/5 0  Shoulder abduction  2-/5 2-  Shoulder adduction     Shoulder extension     Shoulder internal rotation     Shoulder external rotation     Middle trapezius     Lower trapezius     Elbow flexion  3/5 3-  Elbow extension     Wrist flexion     Wrist extension  2/5 2  Wrist ulnar deviation     Wrist radial deviation     Wrist pronation     Wrist supination     (Blank rows = not tested)  HAND FUNCTION: Grip strength: Right: 18 lbs; Left: 10 lbs, Lateral pinch: Right: 5 lbs, Left: 11 lbs, and 3 point pinch: Right: 1 lbs, Left: 15 lbs 02/02/23: Grip strength: Right: 8 lbs (limited by pain); Left: 25 lbs, Lateral pinch: Right: 5 lbs, Left: 12 lbs, and 3 point pinch: Right: 4 lbs, Left: 6 lbs  COORDINATION: Eval: N/A 02/02/23: 9 hole R: 42 sec; L: (unable) Able to pick up 1 peg and place in hole   SENSATION: WFL  EDEMA: TBD  MUSCLE TONE: LUE: Hypotonic  COGNITION: Overall cognitive status: Within functional limits for tasks assessed  VISION: Subjective report:  Pt. Reports having an Eye appointment Baseline vision: Wears glasses all the time  VISION ASSESSMENT: To be further assessed in functional context  PRAXIS: Impaired: Motor planning  TODAY'S TREATMENT:   Moist heat applied to R hand for pain reduction/muscle relaxation used for most of session while pt participated in LUE strengthening exercises.  Therapeutic Exercise:  Pt seen this date for PROM of left UE for shoulder flexion, ABD, ER/IR, elbow flexion/extension, forearm supination/pronation, wrist flexion/extension and digit flexion/extension followed by AAROM for L chest press, shoulder abd 0-90, L shoulder IR/ER, elbow flex and elbow ext against min manual resistance from OT, forearm pron/sup, and active wrist ext x 3 sets x10 reps each.  OT assist to  stabilize LUE to reduce use of accessory muscle groups during LUE therapeutic exercises.  Pt performed 5 sets 10 reps with hand gripper with light resistance (1 red band) for L hand only; assist for set up of gripper in hand.    Neuro re-ed: Facilitated [facility] skills working to pick up, rotate, and release circular wooden blocks on table top with L hand, moving blocks from orange side to yellow side.  Pt required OT assist to stabilize pt proximally to isolate hand from shoulder movements.  Able to flip 4-5 blocks with extra time, and 5 blocks with min A.  Pt practiced forward and lateral reaching patterns sliding blocks forward and laterally across table top with L hand.  Pt required mod A to stabilize proximally to minimize leaning from the trunk and reducing L shoulder hike.   PATIENT EDUCATION: Education details: LUE strengthening Person educated: Patient Education method:  explanation, vc, tactile cues Education comprehension: verbalized understanding  HOME EXERCISE PROGRAM: Towel squeezes grip strengthening, seated marches and knee extension (lifting legs over tub)  GOALS: Goals reviewed with patient? Yes  SHORT TERM GOALS: Target date: 01/24/2023   Pt. Will demonstrate independence with HEPs for the LUE. Baseline: Eval: No current HEP; 02/02/23: Pt demos self passive stretching at the shoulder in a couple different planes (horiz add, flexion); pt has been encouraged to perform towel squeezes for grip strengthening, but pt reports she has not yet done this at home. Goal status: ongoing  LONG TERM GOALS: Target date: 03/07/2023  Pt. Will increase FOTO score by 2 points for Pt. perceived improvement with assessment specific ADL/IADL improvement. Baseline: Eval: FOTO 33, TR score: 39; 02/02/23: FOTO 44 Goal status: achieved/ongoing  2.  Pt. Will improve left shoulder ROM by 10 degrees to assist with self-dressing. Baseline: Left shoulder flexion: 0(93), Abduction: 30(83); 02/02/23: L shoulder  flexion 0 (90), abd 35 (90) Goal status: ongoing  3.  Pt. Will improve left wrist extension by 10 in preparation for functional reaching Baseline: Left wrist extension: 10(50); 02/02/23: L wrist ext 0 (58) Goal status: ongoing  4.  Pt. Will increase left grip strength to 30# or more in preparation for being able to securely stabilize ADL/IADL items at the tabletop. (revised from 5# on 02/02/23) Baseline: Left grip strength: 10#; 02/02/23: L 25# Goal status: revised/ongoing  5. Pt will perform tub transfer with leg lifter and transfer tub bench with supv/set up. Baseline: Min A with sit<>stand from tub bench, mod A to lift L leg over tub; 02/02/23: supv with sit<>stand from tub bench, min A to lift L leg over tub with use of leg lifter   Goal status: ongoing  6. Pt will perform UB bathing with min A.  Baseline: Mod A; 02/02/23: min A  Goal status: achieved  ASSESSMENT: CLINICAL IMPRESSION:  Pt tolerated LUE therapeutic exercises well this date, with some drowsiness last 10 min of session.   Pt continues to require OT assist to stabilize LUE to reduce use of accessory muscle groups during LUE therapeutic exercises.  Pt continues to report that she feels stronger, and notes improved ability to transfer in and out of bed.  Continue OT to facilitate increased active movement of left UE to improve independence in necessary daily tasks.     PERFORMANCE DEFICITS: in functional skills including ADLs, IADLs, coordination, dexterity, ROM, strength, pain, Fine motor control, and Gross motor control, cognitive skills including attention, problem solving, and safety awareness, and psychosocial skills including coping strategies, environmental adaptation, and routines and behaviors.   IMPAIRMENTS: are limiting patient from ADLs, IADLs, leisure, and social participation.   CO-MORBIDITIES: may have co-morbidities  that affects occupational performance. Patient will benefit from skilled OT to address above  impairments and improve overall function.  MODIFICATION OR ASSISTANCE TO COMPLETE EVALUATION: Maximum or significant modification of tasks or assist is necessary to complete an evaluation.  OT OCCUPATIONAL PROFILE AND HISTORY: Comprehensive assessment: Review of records and extensive additional review of physical, cognitive, psychosocial history related to current functional performance.  CLINICAL DECISION MAKING: High - multiple treatment options, significant modification of task necessary  REHAB POTENTIAL: Good  EVALUATION COMPLEXITY: High  PLAN:  OT FREQUENCY: 2x/week  OT DURATION: 12 weeks  PLANNED INTERVENTIONS: self care/ADL training, therapeutic exercise, therapeutic activity, neuromuscular re-education, manual therapy, passive range of motion, functional mobility training, electrical stimulation, and paraffin  RECOMMENDED OTHER SERVICES: OT  CONSULTED AND AGREED  WITH PLAN OF CARE: Patient  PLAN FOR NEXT SESSION: therapeutic exercises, ADL/AE training   Danelle Earthly, MS, OTR/L  02/19/2023, 12:53 PM

## 2023-02-21 ENCOUNTER — Ambulatory Visit: Payer: 59

## 2023-02-21 ENCOUNTER — Encounter: Payer: 59 | Admitting: Occupational Therapy

## 2023-02-22 ENCOUNTER — Ambulatory Visit: Payer: 59

## 2023-02-23 ENCOUNTER — Ambulatory Visit: Payer: 59

## 2023-02-24 ENCOUNTER — Ambulatory Visit: Payer: 59

## 2023-02-24 DIAGNOSIS — M6281 Muscle weakness (generalized): Secondary | ICD-10-CM

## 2023-02-24 DIAGNOSIS — R278 Other lack of coordination: Secondary | ICD-10-CM

## 2023-02-26 NOTE — Therapy (Signed)
OUTPATIENT OCCUPATIONAL THERAPY NEURO TREATMENT NOTE  Patient Name: Dana Lucas MRN: [id number] DOB:[birth year], 72 y.o., female   PCP: Hinda Lenis, MD REFERRING PROVIDER: Sumner Boast, MD  END OF SESSION:  OT End of Session - 02/26/23 1853     Visit Number 14    Number of Visits 24    Date for OT Re-Evaluation 03/07/23    Authorization Type Progress report period starting 02/02/23    Progress Note Due on Visit 10    OT Start Time 0930    OT Stop Time 1015    OT Time Calculation (min) 45 min    Equipment Utilized During Treatment power wc    Activity Tolerance Patient tolerated treatment well    Behavior During Therapy WFL for tasks assessed/performed            Past Medical History:  Diagnosis Date   Diabetes mellitus without complication (HCC)    Hypertension    Stroke [facility])    Past Surgical History:  Procedure Laterality Date   ABDOMINAL HYSTERECTOMY     COLONOSCOPY N/A 10/06/2019   Procedure: COLONOSCOPY;  Surgeon: Toledo, Boykin Nearing, MD;  Location: [facility] ENDOSCOPY;  Service: Gastroenterology;  Laterality: N/A;   Patient Active Problem List   Diagnosis Date Noted   Hypertension    Diabetes mellitus without complication (HCC)    Stroke (HCC)    GIB (gastrointestinal bleeding)    AKI (acute kidney injury) (HCC)    UTI (urinary tract infection)    ONSET DATE: 05/2014  REFERRING DIAG: CVA  THERAPY DIAG:  Muscle weakness (generalized)  Other lack of coordination  Rationale for Evaluation and Treatment: Rehabilitation  SUBJECTIVE:   SUBJECTIVE STATEMENT: Pt arrived for OT only today, stating that she had to reschedule from the other day after missing her ride.   Pt accompanied by: family member , DIL [name])  PERTINENT HISTORY: Pt. is a 72 y.o. female who has a history of multiple CVAs beginning in 2015. Pt. 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   PRECAUTIONS: None  WEIGHT BEARING RESTRICTIONS: No  PAIN:   Are you having pain? 4-5/10 right hand arthritic pain; improves with moist heat during session FALLS: Has patient fallen in last 6 months? No  LIVING ENVIRONMENT: Lives with: lives with their family and lives with their son Lives in: House/apartment Stairs: one story, ramped entrance Has following equipment at home: Wheelchair (power) and Wheelchair (manual), 2 wheeled walker, 3 wheeled walker, power lift, hospital bed, transfer shower bench  PLOF: Independent  PATIENT GOALS: To be able to walk again, and to drive  OBJECTIVE:   HAND DOMINANCE: Right  ADLs: Transfers/ambulation related to ADLs: Eating: Independent Grooming: Independent UB Dressing: Independent LB Dressing: Independent pants, MaxA shoes, and socks Toileting: Assist with transfers, reports independence with toileting care Bathing: Assist from DIL Tub Shower transfers: Min-ModA shower transfers Equipment: See above  IADLs: Shopping: DIL performs shopping Light housekeeping: Unable Meal Prep: Pt. Is able to retrieve a beverage, or snack, light meal prep Community mobility: Power w/c Medication management: Grand daughter assists with pillbox set-up.  Pt. Independently takes medication Financial management: No changes Handwriting: Name: 100% legible in printed form, 75% in cursive form  MOBILITY STATUS: Needs Assist: Uses power w/c  POSTURE COMMENTS:  Sitting balance: WFL supported   FUNCTIONAL OUTCOME MEASURES: FOTO: 33 TR:39 02/02/23: 44  UPPER EXTREMITY ROM:    Active ROM Right WFL Left eval Left 02/02/23  Shoulder flexion  0(93) 0 (90)  Shoulder  abduction  30(83) 35 (90)   Shoulder adduction     Shoulder extension     Shoulder internal rotation     Shoulder external rotation     Elbow flexion  0-87(0-122) 80  Elbow extension   -40  Wrist flexion  52 82  Wrist extension  10(50) 0 (58)  Wrist ulnar deviation     Wrist radial deviation     Wrist pronation     Wrist supination     (Blank  rows = not tested)  Full Digit flexion to the [facility] Active digit extension through 75% of the range grossly Active thumb radial, and palmar abduction  UPPER EXTREMITY MMT:     MMT Right eval Left eval Left 02/02/23  Shoulder flexion  0/5 0  Shoulder abduction  2-/5 2-  Shoulder adduction     Shoulder extension     Shoulder internal rotation     Shoulder external rotation     Middle trapezius     Lower trapezius     Elbow flexion  3/5 3-  Elbow extension     Wrist flexion     Wrist extension  2/5 2  Wrist ulnar deviation     Wrist radial deviation     Wrist pronation     Wrist supination     (Blank rows = not tested)  HAND FUNCTION: Grip strength: Right: 18 lbs; Left: 10 lbs, Lateral pinch: Right: 5 lbs, Left: 11 lbs, and 3 point pinch: Right: 1 lbs, Left: 15 lbs 02/02/23: Grip strength: Right: 8 lbs (limited by pain); Left: 25 lbs, Lateral pinch: Right: 5 lbs, Left: 12 lbs, and 3 point pinch: Right: 4 lbs, Left: 6 lbs  COORDINATION: Eval: N/A 02/02/23: 9 hole R: 42 sec; L: (unable) Able to pick up 1 peg and place in hole   SENSATION: WFL  EDEMA: TBD  MUSCLE TONE: LUE: Hypotonic  COGNITION: Overall cognitive status: Within functional limits for tasks assessed  VISION: Subjective report:  Pt. Reports having an Eye appointment Baseline vision: Wears glasses all the time  VISION ASSESSMENT: To be further assessed in functional context  PRAXIS: Impaired: Motor planning  TODAY'S TREATMENT:   Moist heat applied to R hand for pain reduction/muscle relaxation used for most of session while pt participated in LUE strengthening exercises.  Therapeutic Exercise:  Pt seen this date for PROM of left UE for shoulder flexion, ABD, ER/IR, elbow flexion/extension, forearm supination/pronation, wrist flexion/extension and digit flexion/extension followed by AAROM for L chest press, shoulder abd 0-90, shoulder flex 0-90, L shoulder IR/ER against min resistance from OT, elbow flex and  elbow ext against min manual resistance from OT, active assisted forearm pron/sup, and active wrist ext x 3 sets x10 reps each.  OT assist to stabilize LUE to reduce use of accessory muscle groups during LUE therapeutic exercises.  Pt performed 5 sets 10 reps with hand gripper with light resistance (1 red band) for L hand only; assist for set up of gripper in hand.  Practiced reps of lateral and 3 point pinch strengthening, clipping light resistance therapy clothespins onto a vertical dowel using the L hand.  OT assist to support LUE proximally while clipping pins.  Practiced reps of goal directed reaching with forward and lateral movement patterns to move clothespins across table top.  OT provided mod A to limited compensatory leaning.  Pt practiced reps of active digit extension, beginning with a closed fist, palm down on table top.  Pt practiced opening hand  to push clips forward from her hand.   PATIENT EDUCATION: Education details: LUE strengthening Person educated: Patient Education method: explanation, vc, tactile cues Education comprehension: verbalized understanding  HOME EXERCISE PROGRAM: Towel squeezes grip strengthening, seated marches and knee extension (lifting legs over tub)  GOALS: Goals reviewed with patient? Yes  SHORT TERM GOALS: Target date: 01/24/2023   Pt. Will demonstrate independence with HEPs for the LUE. Baseline: Eval: No current HEP; 02/02/23: Pt demos self passive stretching at the shoulder in a couple different planes (horiz add, flexion); pt has been encouraged to perform towel squeezes for grip strengthening, but pt reports she has not yet done this at home. Goal status: ongoing  LONG TERM GOALS: Target date: 03/07/2023  Pt. Will increase FOTO score by 2 points for Pt. perceived improvement with assessment specific ADL/IADL improvement. Baseline: Eval: FOTO 33, TR score: 39; 02/02/23: FOTO 44 Goal status: achieved/ongoing  2.  Pt. Will improve left shoulder ROM by  10 degrees to assist with self-dressing. Baseline: Left shoulder flexion: 0(93), Abduction: 30(83); 02/02/23: L shoulder flexion 0 (90), abd 35 (90) Goal status: ongoing  3.  Pt. Will improve left wrist extension by 10 in preparation for functional reaching Baseline: Left wrist extension: 10(50); 02/02/23: L wrist ext 0 (58) Goal status: ongoing  4.  Pt. Will increase left grip strength to 30# or more in preparation for being able to securely stabilize ADL/IADL items at the tabletop. (revised from 5# on 02/02/23) Baseline: Left grip strength: 10#; 02/02/23: L 25# Goal status: revised/ongoing  5. Pt will perform tub transfer with leg lifter and transfer tub bench with supv/set up. Baseline: Min A with sit<>stand from tub bench, mod A to lift L leg over tub; 02/02/23: supv with sit<>stand from tub bench, min A to lift L leg over tub with use of leg lifter   Goal status: ongoing  6. Pt will perform UB bathing with min A.  Baseline: Mod A; 02/02/23: min A  Goal status: achieved  ASSESSMENT: CLINICAL IMPRESSION:  Pt tolerated LUE therapeutic exercises well this date, with minimal drowsiness.  Pt demos increased alertness when listening to [place] music, which was provided throughout session.  Pt continues to require OT assist to stabilize LUE to reduce use of accessory muscle groups during LUE therapeutic exercises.  Continue OT to facilitate increased active movement of left UE to improve independence in necessary daily tasks.     PERFORMANCE DEFICITS: in functional skills including ADLs, IADLs, coordination, dexterity, ROM, strength, pain, Fine motor control, and Gross motor control, cognitive skills including attention, problem solving, and safety awareness, and psychosocial skills including coping strategies, environmental adaptation, and routines and behaviors.   IMPAIRMENTS: are limiting patient from ADLs, IADLs, leisure, and social participation.   CO-MORBIDITIES: may have co-morbidities  that  affects occupational performance. Patient will benefit from skilled OT to address above impairments and improve overall function.  MODIFICATION OR ASSISTANCE TO COMPLETE EVALUATION: Maximum or significant modification of tasks or assist is necessary to complete an evaluation.  OT OCCUPATIONAL PROFILE AND HISTORY: Comprehensive assessment: Review of records and extensive additional review of physical, cognitive, psychosocial history related to current functional performance.  CLINICAL DECISION MAKING: High - multiple treatment options, significant modification of task necessary  REHAB POTENTIAL: Good  EVALUATION COMPLEXITY: High  PLAN:  OT FREQUENCY: 2x/week  OT DURATION: 12 weeks  PLANNED INTERVENTIONS: self care/ADL training, therapeutic exercise, therapeutic activity, neuromuscular re-education, manual therapy, passive range of motion, functional mobility training, electrical stimulation, and paraffin  RECOMMENDED OTHER SERVICES: OT  CONSULTED AND AGREED WITH PLAN OF CARE: Patient  PLAN FOR NEXT SESSION: therapeutic exercises, ADL/AE training   Danelle Earthly, MS, OTR/L  02/26/2023, 6:54 PM

## 2023-02-28 ENCOUNTER — Encounter: Payer: 59 | Admitting: Occupational Therapy

## 2023-02-28 ENCOUNTER — Ambulatory Visit: Payer: 59

## 2023-03-01 ENCOUNTER — Ambulatory Visit: Payer: 59 | Admitting: Occupational Therapy

## 2023-03-01 ENCOUNTER — Ambulatory Visit: Payer: 59

## 2023-03-01 DIAGNOSIS — M6281 Muscle weakness (generalized): Secondary | ICD-10-CM

## 2023-03-01 DIAGNOSIS — R262 Difficulty in walking, not elsewhere classified: Secondary | ICD-10-CM

## 2023-03-01 DIAGNOSIS — R278 Other lack of coordination: Secondary | ICD-10-CM

## 2023-03-01 DIAGNOSIS — R2681 Unsteadiness on feet: Secondary | ICD-10-CM

## 2023-03-01 NOTE — Therapy (Unsigned)
OUTPATIENT PHYSICAL THERAPY NEURO TREATMENT NOTE/RECERT    Patient Name: Dana Lucas MRN: [id number] DOB:10-31-1950, 72 y.o., female Today's Date: 03/02/2023   PCP: Bobbye Morton MD REFERRING PROVIDER: Louis Matte MD   END OF SESSION:  PT End of Session - 03/01/23 1637     Visit Number 14    Number of Visits 38    Date for PT Re-Evaluation 05/24/23    Authorization Type 1/10 eval 3/5    PT Start Time 0931    PT Stop Time 1011    PT Time Calculation (min) 40 min    Equipment Utilized During Treatment Gait belt    Activity Tolerance Patient tolerated treatment well;No increased pain    Behavior During Therapy WFL for tasks assessed/performed                  Past Medical History:  Diagnosis Date   Diabetes mellitus without complication (HCC)    Hypertension    Stroke [facility])    Past Surgical History:  Procedure Laterality Date   ABDOMINAL HYSTERECTOMY     COLONOSCOPY N/A 10/06/2019   Procedure: COLONOSCOPY;  Surgeon: Toledo, Boykin Nearing, MD;  Location: [facility] ENDOSCOPY;  Service: Gastroenterology;  Laterality: N/A;   Patient Active Problem List   Diagnosis Date Noted   Hypertension    Diabetes mellitus without complication (HCC)    Stroke (HCC)    GIB (gastrointestinal bleeding)    AKI (acute kidney injury) (HCC)    UTI (urinary tract infection)     ONSET DATE: 2020  REFERRING DIAG: 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   THERAPY DIAG:  Muscle weakness (generalized)  Difficulty in walking, not elsewhere classified  Unsteadiness on feet  Other lack of coordination  Rationale for Evaluation and Treatment: Rehabilitation  SUBJECTIVE:                                                                                                                                                                                             SUBJECTIVE STATEMENT: Pt reports no falls but does report continued R hand pain, rates  it a 3/10. She did come from OT so pt says her hand is feeling a bit better.  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 Stroke was four years ago, has had multiple strokes. Had a little bit of therapy, afterwards in [place]. Moved up to [place] about three years ago. [name] includes DM with diabetic neuropathy, stroke, HTN, AKI, UTI. Uses a walker to walk  to the bathroom (two wheel and three wheel walkers).  Has a power chair for out of the house.   PAIN:  Are you having pain? Yes: NPRS scale:  /10 Pain location: L toe pain Pain description: aching Aggravating factors: standing, moving Relieving factors: nothing known.   PRECAUTIONS: Fall  WEIGHT BEARING RESTRICTIONS: No  FALLS: Has patient fallen in last 6 months? No  LIVING ENVIRONMENT: Lives with: lives with their family Lives in: House/apartment Stairs:  ramp outside Has following equipment at home: [name] - 2 wheeled, Environmental consultant - 4 wheeled, Wheelchair (power), shower chair, Ramped entry, and chair lift and hospital bed   PLOF: Independent  PATIENT GOALS: get up and walk by herself, wants to walk with a cane.   OBJECTIVE:   DIAGNOSTIC FINDINGS: CVA in [place]; no imaging in system  COGNITION: Overall cognitive status:  a little bit per granddaughter.    SENSATION: WFL  COORDINATION: Limited LLE     POSTURE: rounded shoulders, forward head, flexed trunk , and weight shift right    LOWER EXTREMITY MMT:    MMT Right Eval Left Eval  Hip flexion 4 2  Hip extension    Hip abduction 4 2+  Hip adduction 4 2+  Knee flexion 4 2-  Knee extension 4 2+  Ankle dorsiflexion 3   Ankle plantarflexion 3   (Blank rows = not tested)   TRANSFERS: Assistive device utilized: Environmental consultant - 2 wheeled  Sit to stand: CGA and Min A; blocking of L foot  Stand to sit: CGA Chair to chair: Min A    GAIT: Gait pattern:  step to pattern, decreased stance time- Left, decreased ankle dorsiflexion- Left, and poor foot clearance- Left Distance walked: 78ft Assistive device utilized: Environmental consultant - 2 wheeled Level of assistance: CGA Comments: L knee hyperextension with weightbearing  FUNCTIONAL TESTS:  5 times sit to stand: 38 seconds with SUE support  10 meter walk test: 1 min 43 seconds with RW  PATIENT SURVEYS:  FOTO 46  TODAY'S TREATMENT:                                                                                                                              DATE: 03/02/23  TA: Goal retesting completed on this date for recert. PT instructs pt throughout with testing instructions and indications of test performance. Please refer to goal section below for full details.  TE:  STS 1x5, 1x6, 1x1, 1x1 - sets interspersed throughout session.  Gait in // bars for endurance and LE strength -  FWD/BCKWD x multiple reps length of // bars with BUE support and close CGA. PT provides additoinal instruciton in heel-toe sequencing.       PATIENT EDUCATION: Education details: Pt educated throughout session about proper posture and technique with exercises. Improved exercise technique, movement at target joints, use of target muscles after min to mod verbal, visual, tactile cues. Goal reassessment, indications, progress, plan  Person educated: Patient and  granddaughter Education method: Explanation, Demonstration, Tactile cues, and Verbal cues Education comprehension: verbalized understanding, returned demonstration, verbal cues required, tactile cues required, and needs further education  HOME EXERCISE PROGRAM: Access Code: [email or web address] URL: [email or web address] Date: 12/06/2022 Prepared by: Precious Bard  Exercises - Leg Extension  - 1 x daily - 7 x weekly - 2 sets - 10 reps - 5 hold - Seated March  - 1 x daily - 7 x weekly - 2 sets - 10 reps - 5 hold - Seated Hip Abduction  - 1 x daily - 7 x  weekly - 2 sets - 10 reps - 5 hold - Seated Hip Adduction Isometrics with Ball  - 1 x daily - 7 x weekly - 2 sets - 10 reps - 5 hold - Seated Weight Shifting Without Arm Support  - 1 x daily - 7 x weekly - 2 sets - 10 reps - 5 hold   GOALS: Goals reviewed with patient? Yes  SHORT TERM GOALS: Target date: 04/12/2023      Patient will be independent in home exercise program to improve strength/mobility for better functional independence with ADLs. Baseline: 01/26/2023: pt has some difficulty with HEP Goal status: IN PROGRESS    LONG TERM GOALS: Target date: 05/24/2023      Patient will increase FOTO score to equal to or greater than 53    to demonstrate statistically significant improvement in mobility and quality of life.  Baseline: 3/5: 46%; 01/26/23: 45% ; 03/01/23: 42%  Goal status: IN PROGRESS  2.  Patient (> 69 years old) will complete five times sit to stand test in < 15 seconds indicating an increased LE strength and improved balance. Baseline: 3/5: 38 seconds with SUE support; blocking of LLE; 01/26/23 18 seconds with pulling to stand; 03/01/23: 17 seconds pull to stand  Goal status: Partially MET  3.  Patient will increase 10 meter walk test to >1.73m/s as to improve gait speed for better community ambulation and to reduce fall risk Baseline: 3/5: 1 min 43 seconds with RW and w/c follow; 01/26/2023: 2 min 6 seconds; 03/01/23: 1 min 11 sec, 0.14 m/s with WC follow and 2WW  Goal status: IN PROGRESS  4.  Patient will increase BLE gross strength to 4/5 as to improve functional strength for independent gait, increased standing tolerance and increased ADL ability. Baseline: 3/5: grossly 2/5 LLE; 4//25/24: RLE grossly 4-/5, LLE grossly 2/5; 02/28/22 RLE grossly 4+/5 and LLE grossly 3+/5, exception 0/5 PF/DF (overall gross Bilat LE strength around 4-/5) Goal status: PARTIALLY MET    ASSESSMENT:  CLINICAL IMPRESSION: Retesting of goals completed for recert appointment. The pt  overall is making gains toward majority of goals, partially meeting 5xSTS goal, improving , and LE strength. While pt making gains, she did show decrease in FOTO score, which reflects pt's perception of QOL and functional mobility. Will continue to monitor. Patient's condition has the potential to improve in response to therapy. Maximum improvement is yet to be obtained. The anticipated improvement is attainable and reasonable in a generally predictable time.  Pt will continue to benefit from skilled physical therapy intervention to address impairments, improve QOL, and attain therapy goals.    OBJECTIVE IMPAIRMENTS: Abnormal gait, cardiopulmonary status limiting activity, decreased activity tolerance, decreased balance, decreased coordination, decreased endurance, decreased knowledge of use of DME, decreased mobility, difficulty walking, decreased ROM, decreased strength, impaired perceived functional ability, impaired flexibility, impaired UE functional use, improper body mechanics, postural dysfunction, obesity, and pain.   ACTIVITY LIMITATIONS:  carrying, lifting, bending, sitting, standing, squatting, sleeping, stairs, transfers, bed mobility, bathing, toileting, dressing, reach over head, and caring for others  PARTICIPATION LIMITATIONS: meal prep, cleaning, laundry, interpersonal relationship, driving, shopping, community activity, and church  PERSONAL FACTORS: Age, Behavior pattern, Education, Fitness, Past/current experiences, Profession, Sex, Time since onset of injury/illness/exacerbation, and 3+ comorbidities: DM with diabetic neuropathy, stroke, HTN, AKI, UTI  are also affecting patient's functional outcome.   REHAB POTENTIAL: Fair length of time since CVA  CLINICAL DECISION MAKING: Evolving/moderate complexity  EVALUATION COMPLEXITY: Moderate  PLAN:  PT FREQUENCY: 2x/week  PT DURATION: 12 weeks  PLANNED INTERVENTIONS: Therapeutic exercises, Therapeutic activity, Neuromuscular  re-education, Balance training, Gait training, Patient/Family education, Self Care, Joint mobilization, Stair training, Vestibular training, Canalith repositioning, Visual/preceptual remediation/compensation, Orthotic/Fit training, DME instructions, Cognitive remediation, Electrical stimulation, Wheelchair mobility training, Spinal mobilization, Cryotherapy, Moist heat, Splintting, Taping, Traction, Ultrasound, Manual therapy, and Re-evaluation  PLAN FOR NEXT SESSION: stand in // bars, work on walking, strengthening LLE.  Baird Kay, PT 03/02/2023, 11:17 AM

## 2023-03-01 NOTE — Therapy (Signed)
OUTPATIENT OCCUPATIONAL THERAPY NEURO TREATMENT NOTE  Patient Name: Dana Lucas MRN: [id number] DOB:Mar 30, 1951, 72 y.o., female   PCP: Hinda Lenis, MD REFERRING PROVIDER: Sumner Boast, MD  END OF SESSION:  OT End of Session - 03/01/23 1117     Visit Number 15    Number of Visits 24    Date for OT Re-Evaluation 03/07/23    Authorization Type Progress report period starting 02/02/23    OT Start Time 0930    OT Stop Time 1015    OT Time Calculation (min) 45 min    Activity Tolerance Patient tolerated treatment well    Behavior During Therapy [name] for tasks assessed/performed            Past Medical History:  Diagnosis Date   Diabetes mellitus without complication (HCC)    Hypertension    Stroke [facility])    Past Surgical History:  Procedure Laterality Date   ABDOMINAL HYSTERECTOMY     COLONOSCOPY N/A 10/06/2019   Procedure: COLONOSCOPY;  Surgeon: Toledo, Boykin Nearing, MD;  Location: [facility] ENDOSCOPY;  Service: Gastroenterology;  Laterality: N/A;   Patient Active Problem List   Diagnosis Date Noted   Hypertension    Diabetes mellitus without complication (HCC)    Stroke (HCC)    GIB (gastrointestinal bleeding)    AKI (acute kidney injury) (HCC)    UTI (urinary tract infection)    ONSET DATE: 05/2014  REFERRING DIAG: CVA  THERAPY DIAG:  Muscle weakness (generalized)  Rationale for Evaluation and Treatment: Rehabilitation  SUBJECTIVE:    SUBJECTIVE STATEMENT:  Pt. Reports consistently not sleeping well at night. Pt accompanied by: family member , DIL Dana Lucas)  PERTINENT HISTORY: Pt. is a 72 y.o. female who has a history of multiple CVAs beginning in 2015. Pt. 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   PRECAUTIONS: None  WEIGHT BEARING RESTRICTIONS: No  PAIN:  Are you having pain? 3/10 right hand arthritic pain; improved after moist heat, manual therapy, and contrast bath. FALLS: Has patient fallen in last 6 months?  No  LIVING ENVIRONMENT: Lives with: lives with their family and lives with their son Lives in: House/apartment Stairs: one story, ramped entrance Has following equipment at home: Wheelchair (power) and Wheelchair (manual), 2 wheeled walker, 3 wheeled walker, power lift, hospital bed, transfer shower bench  PLOF: Independent  PATIENT GOALS: To be able to walk again, and to drive  OBJECTIVE:   HAND DOMINANCE: Right  ADLs: Transfers/ambulation related to ADLs: Eating: Independent Grooming: Independent UB Dressing: Independent LB Dressing: Independent pants, MaxA shoes, and socks Toileting: Assist with transfers, reports independence with toileting care Bathing: Assist from DIL Tub Shower transfers: Min-ModA shower transfers Equipment: See above  IADLs: Shopping: DIL performs shopping Light housekeeping: Unable Meal Prep: Pt. Is able to retrieve a beverage, or snack, light meal prep Community mobility: Power w/c Medication management: Grand daughter assists with pillbox set-up.  Pt. Independently takes medication Financial management: No changes Handwriting: Name: 100% legible in printed form, 75% in cursive form  MOBILITY STATUS: Needs Assist: Uses power w/c  POSTURE COMMENTS:  Sitting balance: WFL supported   FUNCTIONAL OUTCOME MEASURES: FOTO: 33 TR:39 02/02/23: 44  UPPER EXTREMITY ROM:    Active ROM Right WFL Left eval Left 02/02/23  Shoulder flexion  0(93) 0 (90)  Shoulder abduction  30(83) 35 (90)   Shoulder adduction     Shoulder extension     Shoulder internal rotation     Shoulder external rotation  Elbow flexion  0-87(0-122) 80  Elbow extension   -40  Wrist flexion  52 82  Wrist extension  10(50) 0 (58)  Wrist ulnar deviation     Wrist radial deviation     Wrist pronation     Wrist supination     (Blank rows = not tested)  Full Digit flexion to the [facility] Active digit extension through 75% of the range grossly Active thumb radial, and palmar  abduction  UPPER EXTREMITY MMT:     MMT Right eval Left eval Left 02/02/23  Shoulder flexion  0/5 0  Shoulder abduction  2-/5 2-  Shoulder adduction     Shoulder extension     Shoulder internal rotation     Shoulder external rotation     Middle trapezius     Lower trapezius     Elbow flexion  3/5 3-  Elbow extension     Wrist flexion     Wrist extension  2/5 2  Wrist ulnar deviation     Wrist radial deviation     Wrist pronation     Wrist supination     (Blank rows = not tested)  HAND FUNCTION: Grip strength: Right: 18 lbs; Left: 10 lbs, Lateral pinch: Right: 5 lbs, Left: 11 lbs, and 3 point pinch: Right: 1 lbs, Left: 15 lbs 02/02/23: Grip strength: Right: 8 lbs (limited by pain); Left: 25 lbs, Lateral pinch: Right: 5 lbs, Left: 12 lbs, and 3 point pinch: Right: 4 lbs, Left: 6 lbs  COORDINATION: Eval: N/A 02/02/23: 9 hole R: 42 sec; L: (unable) Able to pick up 1 peg and place in hole   SENSATION: WFL  EDEMA: TBD  MUSCLE TONE: LUE: Hypotonic  COGNITION: Overall cognitive status: Within functional limits for tasks assessed  VISION: Subjective report:  Pt. Reports having an Eye appointment Baseline vision: Wears glasses all the time  VISION ASSESSMENT: To be further assessed in functional context  PRAXIS: Impaired: Motor planning  TODAY'S TREATMENT:    Moist heat applied to left shoulder, and hand for pain reduction/muscle relaxation.   Therapeutic Exercise:    Pt. performed AAROM to the left UE for shoulder flexion, ABD, ADD, ER, elbow flexion/extension, forearm supination, wrist flexion/extension and digit flexion/extension. Reviewed AAROM for hand to face patterns with the LUE with support proximally.    Manual Therapy:   Pt. tolerated retrograde massage to the left hand for edema control. Pt. tolerated soft tissue massage to the left hand for carpal, and metacarpal spread stretches. Soft tissue massage was performed to the right hand  at the volar surface of  the hand, thenar, and hypothenar eminence. Pt.  manual therapy was performed independent  of, and in preparation for the there. Ex.  Contrast Bath:  Pt. Tolerated contrasting heat, and cols packs for the left hand 2/2 edema in preparation for there. Ex. Pt. tolerated contrast heat, and cold to the right hand 2/2 arthritis.  Pt. Education details: LUE strengthening Person educated: Patient Education method: explanation, vc, tactile cues Education comprehension: verbalized understanding  HOME EXERCISE PROGRAM: Towel squeezes grip strengthening, seated marches and knee extension (lifting legs over tub)  GOALS: Goals reviewed with patient? Yes  SHORT TERM GOALS: Target date: 01/24/2023   Pt. Will demonstrate independence with HEPs for the LUE. Baseline: Eval: No current HEP; 02/02/23: Pt demos self passive stretching at the shoulder in a couple different planes (horiz add, flexion); pt has been encouraged to perform towel squeezes for grip strengthening, but pt reports she  has not yet done this at home. Goal status: ongoing  LONG TERM GOALS: Target date: 03/07/2023  Pt. Will increase FOTO score by 2 points for Pt. perceived improvement with assessment specific ADL/IADL improvement. Baseline: Eval: FOTO 33, TR score: 39; 02/02/23: FOTO 44 Goal status: achieved/ongoing  2.  Pt. Will improve left shoulder ROM by 10 degrees to assist with self-dressing. Baseline: Left shoulder flexion: 0(93), Abduction: 30(83); 02/02/23: L shoulder flexion 0 (90), abd 35 (90) Goal status: ongoing  3.  Pt. Will improve left wrist extension by 10 in preparation for functional reaching Baseline: Left wrist extension: 10(50); 02/02/23: L wrist ext 0 (58) Goal status: ongoing  4.  Pt. Will increase left grip strength to 30# or more in preparation for being able to securely stabilize ADL/IADL items at the tabletop. (revised from 5# on 02/02/23) Baseline: Left grip strength: 10#; 02/02/23: L 25# Goal status:  revised/ongoing  5. Pt will perform tub transfer with leg lifter and transfer tub bench with supv/set up. Baseline: Min A with sit<>stand from tub bench, mod A to lift L leg over tub; 02/02/23: supv with sit<>stand from tub bench, min A to lift L leg over tub with use of leg lifter   Goal status: ongoing  6. Pt will perform UB bathing with min A.  Baseline: Mod A; 02/02/23: min A  Goal status: achieved  ASSESSMENT: CLINICAL IMPRESSION:   Pt tolerated LUE therapeutic exercises well this date with intermittent drowsiness. Pt. Continues to present with increased alertness when listening to [place] music, which was provided throughout session. Pt. Responded well to contrast bath, and manual therapy.  Pt. Presented with 3/10 right hand pain initially, and improved by the end of the session following treatment. Pt continues to require OT assist to stabilize LUE to reduce use of accessory muscle groups during LUE therapeutic exercises.  Continue OT to facilitate increased active movement of left UE to improve independence in necessary daily tasks.     PERFORMANCE DEFICITS: in functional skills including ADLs, IADLs, coordination, dexterity, ROM, strength, pain, Fine motor control, and Gross motor control, cognitive skills including attention, problem solving, and safety awareness, and psychosocial skills including coping strategies, environmental adaptation, and routines and behaviors.   IMPAIRMENTS: are limiting patient from ADLs, IADLs, leisure, and social participation.   CO-MORBIDITIES: may have co-morbidities  that affects occupational performance. Patient will benefit from skilled OT to address above impairments and improve overall function.  MODIFICATION OR ASSISTANCE TO COMPLETE EVALUATION: Maximum or significant modification of tasks or assist is necessary to complete an evaluation.  OT OCCUPATIONAL PROFILE AND HISTORY: Comprehensive assessment: Review of records and extensive additional review  of physical, cognitive, psychosocial history related to current functional performance.  CLINICAL DECISION MAKING: High - multiple treatment options, significant modification of task necessary  REHAB POTENTIAL: Good  EVALUATION COMPLEXITY: High  PLAN:  OT FREQUENCY: 2x/week  OT DURATION: 12 weeks  PLANNED INTERVENTIONS: self care/ADL training, therapeutic exercise, therapeutic activity, neuromuscular re-education, manual therapy, passive range of motion, functional mobility training, electrical stimulation, and paraffin  RECOMMENDED OTHER SERVICES: OT  CONSULTED AND AGREED WITH PLAN OF CARE: Patient  PLAN FOR NEXT SESSION: therapeutic exercises, ADL/AE training   Olegario Messier, MS, OTR/L   03/01/2023 11:34

## 2023-03-02 ENCOUNTER — Ambulatory Visit: Payer: 59

## 2023-03-02 ENCOUNTER — Ambulatory Visit: Payer: 59 | Admitting: Occupational Therapy

## 2023-03-02 DIAGNOSIS — R278 Other lack of coordination: Secondary | ICD-10-CM

## 2023-03-02 DIAGNOSIS — M6281 Muscle weakness (generalized): Secondary | ICD-10-CM

## 2023-03-02 DIAGNOSIS — R262 Difficulty in walking, not elsewhere classified: Secondary | ICD-10-CM

## 2023-03-02 DIAGNOSIS — R2681 Unsteadiness on feet: Secondary | ICD-10-CM

## 2023-03-02 NOTE — Therapy (Addendum)
OUTPATIENT OCCUPATIONAL THERAPY NEURO TREATMENT NOTE  Patient Name: Dana Lucas MRN: [id number] DOB:[birth year], 72 y.o., female   PCP: Hinda Lenis, MD REFERRING PROVIDER: Sumner Boast, MD  END OF SESSION:  OT End of Session - 03/02/23 1109     Visit Number 16    Number of Visits 24    Date for OT Re-Evaluation 03/07/23    OT Start Time 1015    OT Stop Time 1100    OT Time Calculation (min) 45 min    Equipment Utilized During Treatment power wc    Activity Tolerance Patient tolerated treatment well    Behavior During Therapy WFL for tasks assessed/performed            Past Medical History:  Diagnosis Date   Diabetes mellitus without complication (HCC)    Hypertension    Stroke [facility])    Past Surgical History:  Procedure Laterality Date   ABDOMINAL HYSTERECTOMY     COLONOSCOPY N/A 10/06/2019   Procedure: COLONOSCOPY;  Surgeon: Toledo, Boykin Nearing, MD;  Location: [facility] ENDOSCOPY;  Service: Gastroenterology;  Laterality: N/A;   Patient Active Problem List   Diagnosis Date Noted   Hypertension    Diabetes mellitus without complication (HCC)    Stroke (HCC)    GIB (gastrointestinal bleeding)    AKI (acute kidney injury) (HCC)    UTI (urinary tract infection)    ONSET DATE: 05/2014  REFERRING DIAG: CVA  THERAPY DIAG:  Muscle weakness (generalized)  Rationale for Evaluation and Treatment: Rehabilitation  SUBJECTIVE:    SUBJECTIVE STATEMENT:  Pt. Reports she plans to buy grippers/finger digit flexors off of [employer] within the next couple of weeks.  Pt accompanied by: family member , DIL [name])  PERTINENT HISTORY: Pt. is a 72 y.o. female who has a history of multiple CVAs beginning in 2015. Pt. 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   PRECAUTIONS: None  WEIGHT BEARING RESTRICTIONS: No  PAIN:  Are you having pain? 2/10 right hand arthritic pain; improved after moist heat, manual therapy, and contrast bath. FALLS:  Has patient fallen in last 6 months? No  LIVING ENVIRONMENT: Lives with: lives with their family and lives with their son Lives in: House/apartment Stairs: one story, ramped entrance Has following equipment at home: Wheelchair (power) and Wheelchair (manual), 2 wheeled walker, 3 wheeled walker, power lift, hospital bed, transfer shower bench  PLOF: Independent  PATIENT GOALS: To be able to walk again, and to drive  OBJECTIVE:   HAND DOMINANCE: Right  ADLs: Transfers/ambulation related to ADLs: Eating: Independent Grooming: Independent UB Dressing: Independent LB Dressing: Independent pants, MaxA shoes, and socks Toileting: Assist with transfers, reports independence with toileting care Bathing: Assist from DIL Tub Shower transfers: Min-ModA shower transfers Equipment: See above  IADLs: Shopping: DIL performs shopping Light housekeeping: Unable Meal Prep: Pt. Is able to retrieve a beverage, or snack, light meal prep Community mobility: Power w/c Medication management: Grand daughter assists with pillbox set-up.  Pt. Independently takes medication Financial management: No changes Handwriting: Name: 100% legible in printed form, 75% in cursive form  MOBILITY STATUS: Needs Assist: Uses power w/c  POSTURE COMMENTS:  Sitting balance: WFL supported   FUNCTIONAL OUTCOME MEASURES: FOTO: 33 TR:39 02/02/23: 44  UPPER EXTREMITY ROM:    Active ROM Right WFL Left eval Left 02/02/23  Shoulder flexion  0(93) 0 (90)  Shoulder abduction  30(83) 35 (90)   Shoulder adduction     Shoulder extension     Shoulder internal rotation  Shoulder external rotation     Elbow flexion  0-87(0-122) 80  Elbow extension   -40  Wrist flexion  52 82  Wrist extension  10(50) 0 (58)  Wrist ulnar deviation     Wrist radial deviation     Wrist pronation     Wrist supination     (Blank rows = not tested)  Full Digit flexion to the [facility] Active digit extension through 75% of the range  grossly Active thumb radial, and palmar abduction  UPPER EXTREMITY MMT:     MMT Right eval Left eval Left 02/02/23  Shoulder flexion  0/5 0  Shoulder abduction  2-/5 2-  Shoulder adduction     Shoulder extension     Shoulder internal rotation     Shoulder external rotation     Middle trapezius     Lower trapezius     Elbow flexion  3/5 3-  Elbow extension     Wrist flexion     Wrist extension  2/5 2  Wrist ulnar deviation     Wrist radial deviation     Wrist pronation     Wrist supination     (Blank rows = not tested)  HAND FUNCTION: Grip strength: Right: 18 lbs; Left: 10 lbs, Lateral pinch: Right: 5 lbs, Left: 11 lbs, and 3 point pinch: Right: 1 lbs, Left: 15 lbs 02/02/23: Grip strength: Right: 8 lbs (limited by pain); Left: 25 lbs, Lateral pinch: Right: 5 lbs, Left: 12 lbs, and 3 point pinch: Right: 4 lbs, Left: 6 lbs  COORDINATION: Eval: N/A 02/02/23: 9 hole R: 42 sec; L: (unable) Able to pick up 1 peg and place in hole   SENSATION: WFL  EDEMA: TBD  MUSCLE TONE: LUE: Hypotonic  COGNITION: Overall cognitive status: Within functional limits for tasks assessed  VISION: Subjective report:  Pt. Reports having an Eye appointment Baseline vision: Wears glasses all the time  VISION ASSESSMENT: To be further assessed in functional context  PRAXIS: Impaired: Motor planning  TODAY'S TREATMENT:    Moist heat applied to left shoulder, and hand for pain reduction/muscle relaxation.   Therapeutic Exercise:    Pt. performed AAROM to the left UE for shoulder flexion, ABD, ADD, ER, elbow flexion/extension, forearm supination, wrist flexion/extension and digit flexion/extension. Reviewed AAROM for hand to face patterns with the LUE with support proximally. Pt. Performed three point pinch using 1st, 2nd, and 3rd digit to place yellow resistive clips onto dowel to encourage increased pinch strength.    Manual Therapy:   Pt. tolerated retrograde massage to the left hand for  edema control. Pt. tolerated soft tissue massage to the left hand for carpal, and metacarpal spread stretches. Soft tissue massage was performed to the right hand  at the volar surface of the hand, thenar, and hypothenar eminence. Pt.  manual therapy was performed independent of, and in preparation for the there. Ex.  Contrast Bath:  Pt. Tolerated contrasting heat, and cold packs for the left hand 2/2 edema in preparation for there. Ex. Pt. tolerated contrast heat, and cold to the right hand 2/2 arthritis.  Pt. Education details: LUE strengthening Person educated: Patient Education method: explanation, vc, tactile cues Education comprehension: verbalized understanding  HOME EXERCISE PROGRAM: Towel squeezes grip strengthening, seated marches and knee extension (lifting legs over tub)  GOALS: Goals reviewed with patient? Yes  SHORT TERM GOALS: Target date: 01/24/2023   Pt. Will demonstrate independence with HEPs for the LUE. Baseline: Eval: No current HEP; 02/02/23: Pt demos  self passive stretching at the shoulder in a couple different planes (horiz add, flexion); pt has been encouraged to perform towel squeezes for grip strengthening, but pt reports she has not yet done this at home. Goal status: ongoing  LONG TERM GOALS: Target date: 03/07/2023  Pt. Will increase FOTO score by 2 points for Pt. perceived improvement with assessment specific ADL/IADL improvement. Baseline: Eval: FOTO 33, TR score: 39; 02/02/23: FOTO 44 Goal status: achieved/ongoing  2.  Pt. Will improve left shoulder ROM by 10 degrees to assist with self-dressing. Baseline: Left shoulder flexion: 0(93), Abduction: 30(83); 02/02/23: L shoulder flexion 0 (90), abd 35 (90) Goal status: ongoing  3.  Pt. Will improve left wrist extension by 10 in preparation for functional reaching Baseline: Left wrist extension: 10(50); 02/02/23: L wrist ext 0 (58) Goal status: ongoing  4.  Pt. Will increase left grip strength to 30# or more in  preparation for being able to securely stabilize ADL/IADL items at the tabletop. (revised from 5# on 02/02/23) Baseline: Left grip strength: 10#; 02/02/23: L 25# Goal status: revised/ongoing  5. Pt will perform tub transfer with leg lifter and transfer tub bench with supv/set up. Baseline: Min A with sit<>stand from tub bench, mod A to lift L leg over tub; 02/02/23: supv with sit<>stand from tub bench, min A to lift L leg over tub with use of leg lifter   Goal status: ongoing  6. Pt will perform UB bathing with min A.  Baseline: Mod A; 02/02/23: min A  Goal status: achieved  ASSESSMENT: CLINICAL IMPRESSION:   Pt tolerated LUE therapeutic exercises well. Pt. Continues to present with increased alertness when listening to [place] music, which was provided throughout session. Pt. Responded well to contrast bath, and manual therapy. Pt. Required verbal cues and LUE support when placing yellow resistive clips onto dowel. Pt. Was able to apply the resistive clips with the forearm positioned in supination without support, however required increased support to apply the resistive clips  on the dowel with the forearm in supination, the wrist extended, and the palm facing down. Pt continues to require OT assist to stabilize LUE to reduce use of accessory muscle groups during LUE therapeutic exercises.  Continue OT to facilitate increased active movement of left UE to improve independence in necessary daily tasks.     PERFORMANCE DEFICITS: in functional skills including ADLs, IADLs, coordination, dexterity, ROM, strength, pain, Fine motor control, and Gross motor control, cognitive skills including attention, problem solving, and safety awareness, and psychosocial skills including coping strategies, environmental adaptation, and routines and behaviors.   IMPAIRMENTS: are limiting patient from ADLs, IADLs, leisure, and social participation.   CO-MORBIDITIES: may have co-morbidities  that affects occupational  performance. Patient will benefit from skilled OT to address above impairments and improve overall function.  MODIFICATION OR ASSISTANCE TO COMPLETE EVALUATION: Maximum or significant modification of tasks or assist is necessary to complete an evaluation.  OT OCCUPATIONAL PROFILE AND HISTORY: Comprehensive assessment: Review of records and extensive additional review of physical, cognitive, psychosocial history related to current functional performance.  CLINICAL DECISION MAKING: High - multiple treatment options, significant modification of task necessary  REHAB POTENTIAL: Good  EVALUATION COMPLEXITY: High  PLAN:  OT FREQUENCY: 2x/week  OT DURATION: 12 weeks  PLANNED INTERVENTIONS: self care/ADL training, therapeutic exercise, therapeutic activity, neuromuscular re-education, manual therapy, passive range of motion, functional mobility training, electrical stimulation, and paraffin  RECOMMENDED OTHER SERVICES: OT  CONSULTED AND AGREED WITH PLAN OF CARE: Patient  PLAN  FOR NEXT SESSION: therapeutic exercises, ADL/AE training   Herma Carson, OTS Olegario Messier, MS, OTR/L  03/02/2023 11:26

## 2023-03-02 NOTE — Therapy (Signed)
OUTPATIENT PHYSICAL THERAPY NEURO TREATMENT NOTE    Patient Name: Dana Lucas MRN: [id number] DOB:1951-01-29, 72 y.o., female Today's Date: 03/02/2023   PCP: Bobbye Morton MD REFERRING PROVIDER: Louis Matte MD   END OF SESSION:  PT End of Session - 03/02/23 1235     Visit Number 15    Number of Visits 38    Date for PT Re-Evaluation 05/24/23    Authorization Type 1/10 eval 3/5    PT Start Time 0935    PT Stop Time 1014    PT Time Calculation (min) 39 min    Equipment Utilized During Treatment Gait belt    Activity Tolerance Patient tolerated treatment well;No increased pain    Behavior During Therapy WFL for tasks assessed/performed                   Past Medical History:  Diagnosis Date   Diabetes mellitus without complication (HCC)    Hypertension    Stroke [facility])    Past Surgical History:  Procedure Laterality Date   ABDOMINAL HYSTERECTOMY     COLONOSCOPY N/A 10/06/2019   Procedure: COLONOSCOPY;  Surgeon: Toledo, Boykin Nearing, MD;  Location: [facility] ENDOSCOPY;  Service: Gastroenterology;  Laterality: N/A;   Patient Active Problem List   Diagnosis Date Noted   Hypertension    Diabetes mellitus without complication (HCC)    Stroke (HCC)    GIB (gastrointestinal bleeding)    AKI (acute kidney injury) (HCC)    UTI (urinary tract infection)     ONSET DATE: 2020  REFERRING DIAG: 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   THERAPY DIAG:  Difficulty in walking, not elsewhere classified  Other lack of coordination  Muscle weakness (generalized)  Unsteadiness on feet  Rationale for Evaluation and Treatment: Rehabilitation  SUBJECTIVE:                                                                                                                                                                                             SUBJECTIVE STATEMENT: Pt reports no updates currently. Pt reports no stumbles/falls since  yesterday. No soreness currently  Pt accompanied by: family member  PERTINENT HISTORY:   Patient presents for 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 Stroke was four years ago, has had multiple strokes. Had a little bit of therapy, afterwards in [place]. Moved up to [place] about three years ago. [name] includes DM with diabetic neuropathy, stroke, HTN, AKI, UTI. Uses a walker to walk to the bathroom (two wheel and three wheel walkers).  Has a power chair for  out of the house.   PAIN:  Are you having pain? Yes: NPRS scale:  /10 Pain location: L toe pain Pain description: aching Aggravating factors: standing, moving Relieving factors: nothing known.   PRECAUTIONS: Fall  WEIGHT BEARING RESTRICTIONS: No  FALLS: Has patient fallen in last 6 months? No  LIVING ENVIRONMENT: Lives with: lives with their family Lives in: House/apartment Stairs:  ramp outside Has following equipment at home: [name] - 2 wheeled, Environmental consultant - 4 wheeled, Wheelchair (power), shower chair, Ramped entry, and chair lift and hospital bed   PLOF: Independent  PATIENT GOALS: get up and walk by herself, wants to walk with a cane.   OBJECTIVE:   DIAGNOSTIC FINDINGS: CVA in [place]; no imaging in system  COGNITION: Overall cognitive status:  a little bit per granddaughter.    SENSATION: WFL  COORDINATION: Limited LLE     POSTURE: rounded shoulders, forward head, flexed trunk , and weight shift right    LOWER EXTREMITY MMT:    MMT Right Eval Left Eval  Hip flexion 4 2  Hip extension    Hip abduction 4 2+  Hip adduction 4 2+  Knee flexion 4 2-  Knee extension 4 2+  Ankle dorsiflexion 3   Ankle plantarflexion 3   (Blank rows = not tested)   TRANSFERS: Assistive device utilized: Environmental consultant - 2 wheeled  Sit to stand: CGA and Min A; blocking of L foot  Stand to sit: CGA Chair to chair: Min A    GAIT: Gait pattern: step to pattern, decreased stance time-  Left, decreased ankle dorsiflexion- Left, and poor foot clearance- Left Distance walked: 8ft Assistive device utilized: Environmental consultant - 2 wheeled Level of assistance: CGA Comments: L knee hyperextension with weightbearing  FUNCTIONAL TESTS:  5 times sit to stand: 38 seconds with SUE support  10 meter walk test: 1 min 43 seconds with RW  PATIENT SURVEYS:  FOTO 46  TODAY'S TREATMENT:                                                                                                                              DATE: 03/02/23  TE: Amb 3 ft 2x with RW and close CGA  Nustep cardioresp and LE endurance training - level 1-3 x 8 minutes use of BUE/BLE. PT provides min assist for LE positioning/positioning in chair, and for mount/dismount. Cuing throughout for SPM pt ranges 80s-100.   STS 3x5 - performed from increased seat height  LAQ 2x10 each LE. PT assisting with LLE to achieve full knee ext. And assist pt back into knee flexion, pt limited in doing this independently, LLE appears possibly to have increased tone   In // bars: Standing march 10x each LE. PT providing close CGA  Standing hip abduction/step-outs 2x10 each LE.PT providing block to knee. Fatiguing for pt.  Encouraged pt to ask her physician at her upcoming appointment if botox could be beneficial to affected LLE   PATIENT EDUCATION: Education  details: Pt educated throughout session about proper posture and technique with exercises. Improved exercise technique, movement at target joints, use of target muscles after min to mod verbal, visual, tactile cues.   Person educated: Patient and granddaughter Education method: Explanation, Demonstration, Tactile cues, and Verbal cues Education comprehension: verbalized understanding, returned demonstration, verbal cues required, tactile cues required, and needs further education  HOME EXERCISE PROGRAM: Access Code: [email or web address] URL: [email or web address] Date:  12/06/2022 Prepared by: Precious Bard  Exercises - Leg Extension  - 1 x daily - 7 x weekly - 2 sets - 10 reps - 5 hold - Seated March  - 1 x daily - 7 x weekly - 2 sets - 10 reps - 5 hold - Seated Hip Abduction  - 1 x daily - 7 x weekly - 2 sets - 10 reps - 5 hold - Seated Hip Adduction Isometrics with Ball  - 1 x daily - 7 x weekly - 2 sets - 10 reps - 5 hold - Seated Weight Shifting Without Arm Support  - 1 x daily - 7 x weekly - 2 sets - 10 reps - 5 hold   GOALS: Goals reviewed with patient? Yes  SHORT TERM GOALS: Target date: 01/03/2023    Patient will be independent in home exercise program to improve strength/mobility for better functional independence with ADLs. Baseline: 01/26/2023: pt has some difficulty with HEP Goal status: IN PROGRESS    LONG TERM GOALS: Target date: 02/28/2023    Patient will increase FOTO score to equal to or greater than 53    to demonstrate statistically significant improvement in mobility and quality of life.  Baseline: 3/5: 46%; 01/26/23: 45% ; 03/01/23: 42%  Goal status: IN PROGRESS  2.  Patient (> 107 years old) will complete five times sit to stand test in < 15 seconds indicating an increased LE strength and improved balance. Baseline: 3/5: 38 seconds with SUE support; blocking of LLE; 01/26/23 18 seconds with pulling to stand; 03/01/23: 17 seconds pull to stand  Goal status: Partially MET  3.  Patient will increase 10 meter walk test to >1.63m/s as to improve gait speed for better community ambulation and to reduce fall risk Baseline: 3/5: 1 min 43 seconds with RW and w/c follow; 01/26/2023: 2 min 6 seconds; 03/01/23: 1 min 11 sec, 0.14 m/s with WC follow and 2WW  Goal status: IN PROGRESS  4.  Patient will increase BLE gross strength to 4/5 as to improve functional strength for independent gait, increased standing tolerance and increased ADL ability. Baseline: 3/5: grossly 2/5 LLE; 4//25/24: RLE grossly 4-/5, LLE grossly 2/5; 02/28/22 RLE grossly 4+/5  and LLE grossly 3+/5, exception 0/5 PF/DF (overall gross Bilat LE strength around 4-/5) Goal status: PARTIALLY MET    ASSESSMENT:  CLINICAL IMPRESSION: Pt with excellent motivation to participate in session and tolerated interventions well without pain or significant fatigue. First part of session focused on endurance training on nustep, where pt was able to sustain several minutes at level 3. While pt able tolerate challenging endurance intervention, she continues to exhibit difficulty with standing activities. This will likely improve when pt is able to get L AFO. Pt will continue to benefit from skilled physical therapy intervention to address impairments, improve QOL, and attain therapy goals.    OBJECTIVE IMPAIRMENTS: Abnormal gait, cardiopulmonary status limiting activity, decreased activity tolerance, decreased balance, decreased coordination, decreased endurance, decreased knowledge of use of DME, decreased mobility, difficulty walking, decreased ROM, decreased strength, impaired perceived functional ability, impaired  flexibility, impaired UE functional use, improper body mechanics, postural dysfunction, obesity, and pain.   ACTIVITY LIMITATIONS: carrying, lifting, bending, sitting, standing, squatting, sleeping, stairs, transfers, bed mobility, bathing, toileting, dressing, reach over head, and caring for others  PARTICIPATION LIMITATIONS: meal prep, cleaning, laundry, interpersonal relationship, driving, shopping, community activity, and church  PERSONAL FACTORS: Age, Behavior pattern, Education, Fitness, Past/current experiences, Profession, Sex, Time since onset of injury/illness/exacerbation, and 3+ comorbidities: DM with diabetic neuropathy, stroke, HTN, AKI, UTI  are also affecting patient's functional outcome.   REHAB POTENTIAL: Fair length of time since CVA  CLINICAL DECISION MAKING: Evolving/moderate complexity  EVALUATION COMPLEXITY: Moderate  PLAN:  PT FREQUENCY:  2x/week  PT DURATION: 12 weeks  PLANNED INTERVENTIONS: Therapeutic exercises, Therapeutic activity, Neuromuscular re-education, Balance training, Gait training, Patient/Family education, Self Care, Joint mobilization, Stair training, Vestibular training, Canalith repositioning, Visual/preceptual remediation/compensation, Orthotic/Fit training, DME instructions, Cognitive remediation, Electrical stimulation, Wheelchair mobility training, Spinal mobilization, Cryotherapy, Moist heat, Splintting, Taping, Traction, Ultrasound, Manual therapy, and Re-evaluation  PLAN FOR NEXT SESSION: stand in // bars, work on walking, strengthening LLE.  Baird Kay, PT 03/02/2023, 12:43 PM

## 2023-03-07 ENCOUNTER — Ambulatory Visit: Payer: 59 | Admitting: Occupational Therapy

## 2023-03-07 ENCOUNTER — Ambulatory Visit: Payer: 59 | Attending: Internal Medicine

## 2023-03-07 DIAGNOSIS — R262 Difficulty in walking, not elsewhere classified: Secondary | ICD-10-CM | POA: Diagnosis present

## 2023-03-07 DIAGNOSIS — R278 Other lack of coordination: Secondary | ICD-10-CM | POA: Diagnosis present

## 2023-03-07 DIAGNOSIS — R2681 Unsteadiness on feet: Secondary | ICD-10-CM | POA: Diagnosis present

## 2023-03-07 DIAGNOSIS — M25561 Pain in right knee: Secondary | ICD-10-CM | POA: Diagnosis present

## 2023-03-07 DIAGNOSIS — M6281 Muscle weakness (generalized): Secondary | ICD-10-CM | POA: Diagnosis present

## 2023-03-07 DIAGNOSIS — G8929 Other chronic pain: Secondary | ICD-10-CM | POA: Diagnosis present

## 2023-03-07 NOTE — Therapy (Addendum)
OUTPATIENT OCCUPATIONAL THERAPY NEURO TREATMENT NOTE  Patient Name: Dana Lucas MRN: [id number] DOB:10-Nov-1950, 72 y.o., female   PCP: Hinda Lenis, MD REFERRING PROVIDER: Sumner Boast, MD  END OF SESSION:  OT End of Session - 03/07/23 1451     Visit Number 17    Number of Visits 24    Date for OT Re-Evaluation 03/07/23    OT Start Time 1100    OT Stop Time 1145    OT Time Calculation (min) 45 min    Equipment Utilized During Treatment power wc    Activity Tolerance Patient tolerated treatment well            Past Medical History:  Diagnosis Date   Diabetes mellitus without complication (HCC)    Hypertension    Stroke [facility])    Past Surgical History:  Procedure Laterality Date   ABDOMINAL HYSTERECTOMY     COLONOSCOPY N/A 10/06/2019   Procedure: COLONOSCOPY;  Surgeon: Toledo, Boykin Nearing, MD;  Location: [facility] ENDOSCOPY;  Service: Gastroenterology;  Laterality: N/A;   Patient Active Problem List   Diagnosis Date Noted   Hypertension    Diabetes mellitus without complication (HCC)    Stroke (HCC)    GIB (gastrointestinal bleeding)    AKI (acute kidney injury) (HCC)    UTI (urinary tract infection)    ONSET DATE: 05/2014  REFERRING DIAG: CVA  THERAPY DIAG:  Muscle weakness (generalized)  Rationale for Evaluation and Treatment: Rehabilitation  SUBJECTIVE:    SUBJECTIVE STATEMENT:  Pt. reports she is doing well today. Pt accompanied by: Self  PERTINENT HISTORY: Pt. is a 72 y.o. female who has a history of multiple CVAs beginning in 2015. Pt. 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   PRECAUTIONS: None  WEIGHT BEARING RESTRICTIONS: No  PAIN:  Are you having pain? 2/10 right hand arthritic pain FALLS: Has patient fallen in last 6 months? No  LIVING ENVIRONMENT: Lives with: lives with their family and lives with their son Lives in: House/apartment Stairs: one story, ramped entrance Has following equipment at  home: Wheelchair (power) and Wheelchair (manual), 2 wheeled walker, 3 wheeled walker, power lift, hospital bed, transfer shower bench  PLOF: Independent  PATIENT GOALS: To be able to walk again, and to drive  OBJECTIVE:   HAND DOMINANCE: Right  ADLs: Transfers/ambulation related to ADLs: Eating: Independent Grooming: Independent UB Dressing: Independent LB Dressing: Independent pants, MaxA shoes, and socks Toileting: Assist with transfers, reports independence with toileting care Bathing: Assist from DIL Tub Shower transfers: Min-ModA shower transfers Equipment: See above  IADLs: Shopping: DIL performs shopping Light housekeeping: Unable Meal Prep: Pt. Is able to retrieve a beverage, or snack, light meal prep Community mobility: Power w/c Medication management: Grand daughter assists with pillbox set-up.  Pt. Independently takes medication Financial management: No changes Handwriting: Name: 100% legible in printed form, 75% in cursive form  MOBILITY STATUS: Needs Assist: Uses power w/c  POSTURE COMMENTS:  Sitting balance: WFL supported   FUNCTIONAL OUTCOME MEASURES: FOTO: 33 TR:39 02/02/23: 44  UPPER EXTREMITY ROM:    Active ROM Right WFL Left eval Left 02/02/23  Shoulder flexion  0(93) 0 (90)  Shoulder abduction  30(83) 35 (90)   Shoulder adduction     Shoulder extension     Shoulder internal rotation     Shoulder external rotation     Elbow flexion  0-87(0-122) 80  Elbow extension   -40  Wrist flexion  52 82  Wrist extension  10(50) 0 (58)  Wrist ulnar deviation     Wrist radial deviation     Wrist pronation     Wrist supination     (Blank rows = not tested)  Full Digit flexion to the [facility] Active digit extension through 75% of the range grossly Active thumb radial, and palmar abduction  UPPER EXTREMITY MMT:     MMT Right eval Left eval Left 02/02/23  Shoulder flexion  0/5 0  Shoulder abduction  2-/5 2-  Shoulder adduction     Shoulder extension      Shoulder internal rotation     Shoulder external rotation     Middle trapezius     Lower trapezius     Elbow flexion  3/5 3-  Elbow extension     Wrist flexion     Wrist extension  2/5 2  Wrist ulnar deviation     Wrist radial deviation     Wrist pronation     Wrist supination     (Blank rows = not tested)  HAND FUNCTION: Grip strength: Right: 18 lbs; Left: 10 lbs, Lateral pinch: Right: 5 lbs, Left: 11 lbs, and 3 point pinch: Right: 1 lbs, Left: 15 lbs 02/02/23: Grip strength: Right: 8 lbs (limited by pain); Left: 25 lbs, Lateral pinch: Right: 5 lbs, Left: 12 lbs, and 3 point pinch: Right: 4 lbs, Left: 6 lbs  COORDINATION: Eval: N/A 02/02/23: 9 hole R: 42 sec; L: (unable) Able to pick up 1 peg and place in hole   SENSATION: WFL  EDEMA: TBD  MUSCLE TONE: LUE: Hypotonic  COGNITION: Overall cognitive status: Within functional limits for tasks assessed  VISION: Subjective report:  Pt. Reports having an Eye appointment Baseline vision: Wears glasses all the time  VISION ASSESSMENT: To be further assessed in functional context  PRAXIS: Impaired: Motor planning  TODAY'S TREATMENT:    Moist heat applied to left hand for pain reduction/muscle relaxation.   Therapeutic Exercise:    Pt. performed AAROM to the left UE for shoulder flexion, ABD, ADD, ER, elbow flexion/extension, forearm supination, wrist flexion/extension and digit flexion/extension. Reviewed AAROM for hand to face patterns with the LUE with support proximally. Pt. Performed three point pinch using 1st, 2nd, and 3rd digit to pick up one inch blocks off of elevated resistive velcro board to encourage increased pinch strength.    Manual Therapy:   Pt. tolerated retrograde massage to the left hand for edema control. Pt. tolerated soft tissue massage to the left hand for carpal, and metacarpal spread stretches. Pt.  manual therapy was performed independent of, and in preparation for the there. Ex.  Contrast  Bath:  Pt. Tolerated contrasting heat, and cold packs for the left hand 2/2 edema in preparation for there. Ex. Pt. tolerated contrast heat, and cold to the right hand 2/2 arthritis.  Pt. Education details: LUE strengthening Person educated: Patient Education method: explanation, vc, tactile cues Education comprehension: verbalized understanding  HOME EXERCISE PROGRAM: Towel squeezes grip strengthening, seated marches and knee extension (lifting legs over tub)  GOALS: Goals reviewed with patient? Yes  SHORT TERM GOALS: Target date: 01/24/2023   Pt. Will demonstrate independence with HEPs for the LUE. Baseline: Eval: No current HEP; 02/02/23: Pt demos self passive stretching at the shoulder in a couple different planes (horiz add, flexion); pt has been encouraged to perform towel squeezes for grip strengthening, but pt reports she has not yet done this at home. Goal status: ongoing  LONG TERM GOALS: Target date: 03/07/2023  Pt. Will increase  FOTO score by 2 points for Pt. perceived improvement with assessment specific ADL/IADL improvement. Baseline: Eval: FOTO 33, TR score: 39; 02/02/23: FOTO 44 Goal status: achieved/ongoing  2.  Pt. Will improve left shoulder ROM by 10 degrees to assist with self-dressing. Baseline: Left shoulder flexion: 0(93), Abduction: 30(83); 02/02/23: L shoulder flexion 0 (90), abd 35 (90) Goal status: ongoing  3.  Pt. Will improve left wrist extension by 10 in preparation for functional reaching Baseline: Left wrist extension: 10(50); 02/02/23: L wrist ext 0 (58) Goal status: ongoing  4.  Pt. Will increase left grip strength to 30# or more in preparation for being able to securely stabilize ADL/IADL items at the tabletop. (revised from 5# on 02/02/23) Baseline: Left grip strength: 10#; 02/02/23: L 25# Goal status: revised/ongoing  5. Pt will perform tub transfer with leg lifter and transfer tub bench with supv/set up. Baseline: Min A with sit<>stand from tub bench,  mod A to lift L leg over tub; 02/02/23: supv with sit<>stand from tub bench, min A to lift L leg over tub with use of leg lifter   Goal status: ongoing  6. Pt will perform UB bathing with min A.  Baseline: Mod A; 02/02/23: min A  Goal status: achieved  ASSESSMENT: CLINICAL IMPRESSION:   Pt tolerated LUE therapeutic exercises well. Pt. Continues to present with increased alertness when listening to [place] music, which was provided throughout session. Pt. Responded well to contrast bath, and manual therapy. Pt. Required verbal cues and LUE support when using three point pinch to pick up 1 inch blocks off and elevated resistive Velcro board. Pt. Was able to pick up one inch blocks with the forearm positioned in supination without support. Pt continues to require OT assist to stabilize LUE to reduce use of accessory muscle groups during LUE therapeutic exercises.  Continue OT to facilitate increased active movement of left UE to improve independence in necessary daily tasks.     PERFORMANCE DEFICITS: in functional skills including ADLs, IADLs, coordination, dexterity, ROM, strength, pain, Fine motor control, and Gross motor control, cognitive skills including attention, problem solving, and safety awareness, and psychosocial skills including coping strategies, environmental adaptation, and routines and behaviors.   IMPAIRMENTS: are limiting patient from ADLs, IADLs, leisure, and social participation.   CO-MORBIDITIES: may have co-morbidities  that affects occupational performance. Patient will benefit from skilled OT to address above impairments and improve overall function.  MODIFICATION OR ASSISTANCE TO COMPLETE EVALUATION: Maximum or significant modification of tasks or assist is necessary to complete an evaluation.  OT OCCUPATIONAL PROFILE AND HISTORY: Comprehensive assessment: Review of records and extensive additional review of physical, cognitive, psychosocial history related to current functional  performance.  CLINICAL DECISION MAKING: High - multiple treatment options, significant modification of task necessary  REHAB POTENTIAL: Good  EVALUATION COMPLEXITY: High  PLAN:  OT FREQUENCY: 2x/week  OT DURATION: 12 weeks  PLANNED INTERVENTIONS: self care/ADL training, therapeutic exercise, therapeutic activity, neuromuscular re-education, manual therapy, passive range of motion, functional mobility training, electrical stimulation, and paraffin  RECOMMENDED OTHER SERVICES: OT  CONSULTED AND AGREED WITH PLAN OF CARE: Patient  PLAN FOR NEXT SESSION: therapeutic exercises, ADL/AE training   Herma Carson, OTS Olegario Messier, MS, OTR/L  03/07/2023 3:15 PM

## 2023-03-07 NOTE — Therapy (Signed)
OUTPATIENT PHYSICAL THERAPY NEURO TREATMENT NOTE    Patient Name: Dana Lucas MRN: [id number] DOB:10-15-1950, 72 y.o., female Today's Date: 03/07/2023   PCP: Bobbye Morton MD REFERRING PROVIDER: Louis Matte MD   END OF SESSION:  PT End of Session - 03/07/23 1028     Visit Number 16    Number of Visits 38    Date for PT Re-Evaluation 05/24/23    Authorization Type 1/10 eval 3/5    PT Start Time 1023    PT Stop Time 1058    PT Time Calculation (min) 35 min    Equipment Utilized During Treatment Gait belt    Activity Tolerance Patient tolerated treatment well;No increased pain    Behavior During Therapy WFL for tasks assessed/performed                    Past Medical History:  Diagnosis Date   Diabetes mellitus without complication (HCC)    Hypertension    Stroke [facility])    Past Surgical History:  Procedure Laterality Date   ABDOMINAL HYSTERECTOMY     COLONOSCOPY N/A 10/06/2019   Procedure: COLONOSCOPY;  Surgeon: Toledo, Boykin Nearing, MD;  Location: [facility] ENDOSCOPY;  Service: Gastroenterology;  Laterality: N/A;   Patient Active Problem List   Diagnosis Date Noted   Hypertension    Diabetes mellitus without complication (HCC)    Stroke (HCC)    GIB (gastrointestinal bleeding)    AKI (acute kidney injury) (HCC)    UTI (urinary tract infection)     ONSET DATE: 2020  REFERRING DIAG: 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   THERAPY DIAG:  Muscle weakness (generalized)  Other lack of coordination  Difficulty in walking, not elsewhere classified  Rationale for Evaluation and Treatment: Rehabilitation  SUBJECTIVE:                                                                                                                                                                                             SUBJECTIVE STATEMENT: Pt reports no current updates/concerns. Pt still waiting for AFO form.  Pt accompanied by:  family member  PERTINENT HISTORY:   Patient presents for 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 Stroke was four years ago, has had multiple strokes. Had a little bit of therapy, afterwards in [place]. Moved up to [place] about three years ago. [name] includes DM with diabetic neuropathy, stroke, HTN, AKI, UTI. Uses a walker to walk to the bathroom (two wheel and three wheel walkers).  Has a power chair for out of the house.  PAIN:  Are you having pain? Yes: NPRS scale:  /10 Pain location: L toe pain Pain description: aching Aggravating factors: standing, moving Relieving factors: nothing known.   PRECAUTIONS: Fall  WEIGHT BEARING RESTRICTIONS: No  FALLS: Has patient fallen in last 6 months? No  LIVING ENVIRONMENT: Lives with: lives with their family Lives in: House/apartment Stairs:  ramp outside Has following equipment at home: [name] - 2 wheeled, Environmental consultant - 4 wheeled, Wheelchair (power), shower chair, Ramped entry, and chair lift and hospital bed   PLOF: Independent  PATIENT GOALS: get up and walk by herself, wants to walk with a cane.   OBJECTIVE:   DIAGNOSTIC FINDINGS: CVA in [place]; no imaging in system  COGNITION: Overall cognitive status:  a little bit per granddaughter.    SENSATION: WFL  COORDINATION: Limited LLE     POSTURE: rounded shoulders, forward head, flexed trunk , and weight shift right    LOWER EXTREMITY MMT:    MMT Right Eval Left Eval  Hip flexion 4 2  Hip extension    Hip abduction 4 2+  Hip adduction 4 2+  Knee flexion 4 2-  Knee extension 4 2+  Ankle dorsiflexion 3   Ankle plantarflexion 3   (Blank rows = not tested)   TRANSFERS: Assistive device utilized: Environmental consultant - 2 wheeled  Sit to stand: CGA and Min A; blocking of L foot  Stand to sit: CGA Chair to chair: Min A    GAIT: Gait pattern: step to pattern, decreased stance time- Left, decreased ankle dorsiflexion- Left, and poor foot  clearance- Left Distance walked: 40ft Assistive device utilized: Environmental consultant - 2 wheeled Level of assistance: CGA Comments: L knee hyperextension with weightbearing  FUNCTIONAL TESTS:  5 times sit to stand: 38 seconds with SUE support  10 meter walk test: 1 min 43 seconds with RW  PATIENT SURVEYS:  FOTO 46  TODAY'S TREATMENT:                                                                                                                              DATE: 03/07/23  TE: Amb 6 ft 1x with RW and close CGA from transport chair to standard chair  Nustep cardioresp and LE endurance training - level 1-4 x 8 minutes use of BUE/BLE. PT provides min assist for LE positioning/positioning in chair, and for mount/dismount. Cuing throughout for SPM pt ranges 80s-100.  Pt rates medium  STS 1x6, 2x8 (some partial reps)- performed from standard chair use of UUE to push off chair  In // bars with 2# AW donned each LE: Ambulation 3x FWD/BCKWD with cuing for big steps, length of bars. Pt rates medium.  LLE soccer ball kicks x multiple reps x 1 round without AW, 1 round with 2# AW  2# AW RLE LAQ 2x15, 1x10     PATIENT EDUCATION: Education details: Pt educated throughout session about proper posture and technique with exercises. Improved exercise technique, movement at target  joints, use of target muscles after min to mod verbal, visual, tactile cues.   Person educated: Patient and granddaughter Education method: Explanation, Demonstration, Tactile cues, and Verbal cues Education comprehension: verbalized understanding, returned demonstration, verbal cues required, tactile cues required, and needs further education  HOME EXERCISE PROGRAM: Access Code: [email or web address] URL: [email or web address] Date: 12/06/2022 Prepared by: Precious Bard  Exercises - Leg Extension  - 1 x daily - 7 x weekly - 2 sets - 10 reps - 5 hold - Seated March  - 1 x daily - 7 x weekly - 2 sets - 10 reps - 5 hold -  Seated Hip Abduction  - 1 x daily - 7 x weekly - 2 sets - 10 reps - 5 hold - Seated Hip Adduction Isometrics with Ball  - 1 x daily - 7 x weekly - 2 sets - 10 reps - 5 hold - Seated Weight Shifting Without Arm Support  - 1 x daily - 7 x weekly - 2 sets - 10 reps - 5 hold   GOALS: Goals reviewed with patient? Yes  SHORT TERM GOALS: Target date: 01/03/2023    Patient will be independent in home exercise program to improve strength/mobility for better functional independence with ADLs. Baseline: 01/26/2023: pt has some difficulty with HEP Goal status: IN PROGRESS    LONG TERM GOALS: Target date: 02/28/2023    Patient will increase FOTO score to equal to or greater than 53    to demonstrate statistically significant improvement in mobility and quality of life.  Baseline: 3/5: 46%; 01/26/23: 45% ; 03/01/23: 42%  Goal status: IN PROGRESS  2.  Patient (> 55 years old) will complete five times sit to stand test in < 15 seconds indicating an increased LE strength and improved balance. Baseline: 3/5: 38 seconds with SUE support; blocking of LLE; 01/26/23 18 seconds with pulling to stand; 03/01/23: 17 seconds pull to stand  Goal status: Partially MET  3.  Patient will increase 10 meter walk test to >1.38m/s as to improve gait speed for better community ambulation and to reduce fall risk Baseline: 3/5: 1 min 43 seconds with RW and w/c follow; 01/26/2023: 2 min 6 seconds; 03/01/23: 1 min 11 sec, 0.14 m/s with WC follow and 2WW  Goal status: IN PROGRESS  4.  Patient will increase BLE gross strength to 4/5 as to improve functional strength for independent gait, increased standing tolerance and increased ADL ability. Baseline: 3/5: grossly 2/5 LLE; 4//25/24: RLE grossly 4-/5, LLE grossly 2/5; 02/28/22 RLE grossly 4+/5 and LLE grossly 3+/5, exception 0/5 PF/DF (overall gross Bilat LE strength around 4-/5) Goal status: PARTIALLY MET    ASSESSMENT:  CLINICAL IMPRESSION: Pt with excellent motivation to  participate in session and tolerates interventions well without pain and without significant fatigue. She was able to progress endurance training on nustep and showed within-session improvement in LLE soccer-ball kicks with increased quad activation. Pt will continue to benefit from skilled physical therapy intervention to address impairments, improve QOL, and attain therapy goals.    OBJECTIVE IMPAIRMENTS: Abnormal gait, cardiopulmonary status limiting activity, decreased activity tolerance, decreased balance, decreased coordination, decreased endurance, decreased knowledge of use of DME, decreased mobility, difficulty walking, decreased ROM, decreased strength, impaired perceived functional ability, impaired flexibility, impaired UE functional use, improper body mechanics, postural dysfunction, obesity, and pain.   ACTIVITY LIMITATIONS: carrying, lifting, bending, sitting, standing, squatting, sleeping, stairs, transfers, bed mobility, bathing, toileting, dressing, reach over head, and caring for others  PARTICIPATION LIMITATIONS: meal prep, cleaning,  laundry, interpersonal relationship, driving, shopping, community activity, and church  PERSONAL FACTORS: Age, Behavior pattern, Education, Fitness, Past/current experiences, Profession, Sex, Time since onset of injury/illness/exacerbation, and 3+ comorbidities: DM with diabetic neuropathy, stroke, HTN, AKI, UTI  are also affecting patient's functional outcome.   REHAB POTENTIAL: Fair length of time since CVA  CLINICAL DECISION MAKING: Evolving/moderate complexity  EVALUATION COMPLEXITY: Moderate  PLAN:  PT FREQUENCY: 2x/week  PT DURATION: 12 weeks  PLANNED INTERVENTIONS: Therapeutic exercises, Therapeutic activity, Neuromuscular re-education, Balance training, Gait training, Patient/Family education, Self Care, Joint mobilization, Stair training, Vestibular training, Canalith repositioning, Visual/preceptual remediation/compensation,  Orthotic/Fit training, DME instructions, Cognitive remediation, Electrical stimulation, Wheelchair mobility training, Spinal mobilization, Cryotherapy, Moist heat, Splintting, Taping, Traction, Ultrasound, Manual therapy, and Re-evaluation  PLAN FOR NEXT SESSION: stand in // bars, work on walking, strengthening LLE.  Baird Kay, PT 03/07/2023, 1:15 PM

## 2023-03-09 ENCOUNTER — Ambulatory Visit: Payer: 59

## 2023-03-10 ENCOUNTER — Ambulatory Visit: Payer: 59

## 2023-03-10 DIAGNOSIS — R278 Other lack of coordination: Secondary | ICD-10-CM

## 2023-03-10 DIAGNOSIS — R262 Difficulty in walking, not elsewhere classified: Secondary | ICD-10-CM

## 2023-03-10 DIAGNOSIS — M6281 Muscle weakness (generalized): Secondary | ICD-10-CM | POA: Diagnosis not present

## 2023-03-10 DIAGNOSIS — R2681 Unsteadiness on feet: Secondary | ICD-10-CM

## 2023-03-10 NOTE — Therapy (Signed)
OUTPATIENT OCCUPATIONAL THERAPY NEURO TREATMENT NOTE  Patient Name: Dana Lucas MRN: [id number] DOB:Oct 12, 1950, 72 y.o., female   PCP: Hinda Lenis, MD REFERRING PROVIDER: Sumner Boast, MD  END OF SESSION:  OT End of Session - 03/01/23 1117     Visit Number 15    Number of Visits 24    Date for OT Re-Evaluation 03/07/23    Authorization Type Progress report period starting 02/02/23    OT Start Time 0930    OT Stop Time 1015    OT Time Calculation (min) 45 min    Activity Tolerance Patient tolerated treatment well    Behavior During Therapy [facility] for tasks assessed/performed            Past Medical History:  Diagnosis Date   Diabetes mellitus without complication (HCC)    Hypertension    Stroke [facility])    Past Surgical History:  Procedure Laterality Date   ABDOMINAL HYSTERECTOMY     COLONOSCOPY N/A 10/06/2019   Procedure: COLONOSCOPY;  Surgeon: Toledo, Boykin Nearing, MD;  Location: [facility] ENDOSCOPY;  Service: Gastroenterology;  Laterality: N/A;   Patient Active Problem List   Diagnosis Date Noted   Hypertension    Diabetes mellitus without complication (HCC)    Stroke (HCC)    GIB (gastrointestinal bleeding)    AKI (acute kidney injury) (HCC)    UTI (urinary tract infection)    ONSET DATE: 05/2014  REFERRING DIAG: CVA  THERAPY DIAG:  Muscle weakness (generalized)  Rationale for Evaluation and Treatment: Rehabilitation  SUBJECTIVE:    SUBJECTIVE STATEMENT:  Pt. Reports consistently not sleeping well at night. Pt accompanied by: family member , DIL [name])  PERTINENT HISTORY: Pt. is a 72 y.o. female who has a history of multiple CVAs beginning in 2015. Pt. 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   PRECAUTIONS: None  WEIGHT BEARING RESTRICTIONS: No  PAIN:  Are you having pain? 3/10 right hand arthritic pain; improved after moist heat, manual therapy, and contrast bath. FALLS: Has patient fallen in last 6 months?  No  LIVING ENVIRONMENT: Lives with: lives with their family and lives with their son Lives in: House/apartment Stairs: one story, ramped entrance Has following equipment at home: Wheelchair (power) and Wheelchair (manual), 2 wheeled walker, 3 wheeled walker, power lift, hospital bed, transfer shower bench  PLOF: Independent  PATIENT GOALS: To be able to walk again, and to drive  OBJECTIVE:   HAND DOMINANCE: Right  ADLs: Transfers/ambulation related to ADLs: Eating: Independent Grooming: Independent UB Dressing: Independent LB Dressing: Independent pants, MaxA shoes, and socks Toileting: Assist with transfers, reports independence with toileting care Bathing: Assist from DIL Tub Shower transfers: Min-ModA shower transfers Equipment: See above  IADLs: Shopping: DIL performs shopping Light housekeeping: Unable Meal Prep: Pt. Is able to retrieve a beverage, or snack, light meal prep Community mobility: Power w/c Medication management: Grand daughter assists with pillbox set-up.  Pt. Independently takes medication Financial management: No changes Handwriting: Name: 100% legible in printed form, 75% in cursive form  MOBILITY STATUS: Needs Assist: Uses power w/c  POSTURE COMMENTS:  Sitting balance: WFL supported   FUNCTIONAL OUTCOME MEASURES: FOTO: 33 TR:39 02/02/23: 44  UPPER EXTREMITY ROM:    Active ROM Right WFL Left eval Left 02/02/23 Left  03/10/23  Shoulder flexion  0(93) 0 (90) 0 (90)  Shoulder abduction  30(83) 35 (90)  40 (90)  Shoulder adduction      Shoulder extension      Shoulder internal rotation  Shoulder external rotation      Elbow flexion  0-87(0-122) 80   Elbow extension   -40   Wrist flexion  52 82   Wrist extension  10(50) 0 (58) 0 (70)  Wrist ulnar deviation      Wrist radial deviation      Wrist pronation      Wrist supination      (Blank rows = not tested)  Full Digit flexion to the [facility] Active digit extension through 75% of the  range grossly Active thumb radial, and palmar abduction  Active digit extension through full range grossly digits 1-5  UPPER EXTREMITY MMT:     MMT Right eval Left eval Left 02/02/23 Left 03/10/23  Shoulder flexion  0/5 0   Shoulder abduction  2-/5 2-   Shoulder adduction      Shoulder extension      Shoulder internal rotation      Shoulder external rotation      Middle trapezius      Lower trapezius      Elbow flexion  3/5 3- 3-  Elbow extension      Wrist flexion      Wrist extension  2/5 2 3-  Wrist ulnar deviation      Wrist radial deviation      Wrist pronation      Wrist supination      (Blank rows = not tested)  HAND FUNCTION: Grip strength: Right: 18 lbs; Left: 10 lbs, Lateral pinch: Right: 5 lbs, Left: 11 lbs, and 3 point pinch: Right: 1 lbs, Left: 15 lbs 02/02/23: Grip strength: Right: 8 lbs (limited by pain); Left: 25 lbs, Lateral pinch: Right: 5 lbs, Left: 12 lbs, and 3 point pinch: Right: 4 lbs, Left: 6 lbs 03/10/23: Grip strength: Right: 12 lbs (limited by pain); Left: 25 lbs, Lateral pinch: Right: 8 lbs, Left: 12 lbs, and 3 point pinch: Right: 7 lbs, Left: 5 lbs  COORDINATION: Eval: N/A 02/02/23: 9 hole R: 42 sec; L: (unable) Able to pick up 1 peg and place in hole  03/10/23:   SENSATION: WFL  EDEMA: TBD  MUSCLE TONE: LUE: Hypotonic  COGNITION: Overall cognitive status: Within functional limits for tasks assessed  VISION: Subjective report:  Pt. Reports having an Eye appointment Baseline vision: Wears glasses all the time  VISION ASSESSMENT: To be further assessed in functional context  PRAXIS: Impaired: Motor planning  TODAY'S TREATMENT:    Moist heat applied to left shoulder, and hand for pain reduction/muscle relaxation.   Therapeutic Exercise:    Pt. performed AAROM to the left UE for shoulder flexion, ABD, ADD, ER, elbow flexion/extension, forearm supination, wrist flexion/extension and digit flexion/extension. Reviewed AAROM for hand to face  patterns with the LUE with support proximally.    Manual Therapy:   Pt. tolerated retrograde massage to the left hand for edema control. Pt. tolerated soft tissue massage to the left hand for carpal, and metacarpal spread stretches. Soft tissue massage was performed to the right hand  at the volar surface of the hand, thenar, and hypothenar eminence. Pt.  manual therapy was performed independent  of, and in preparation for the there. Ex.  Contrast Bath:  Pt. Tolerated contrasting heat, and cols packs for the left hand 2/2 edema in preparation for there. Ex. Pt. tolerated contrast heat, and cold to the right hand 2/2 arthritis.  Pt. Education details: LUE strengthening Person educated: Patient Education method: explanation, vc, tactile cues Education comprehension: verbalized understanding  HOME  EXERCISE PROGRAM: Towel squeezes grip strengthening, seated marches and knee extension (lifting legs over tub)  GOALS: Goals reviewed with patient? Yes  SHORT TERM GOALS: Target date: 01/24/2023   Pt. Will demonstrate independence with HEPs for the LUE. Baseline: Eval: No current HEP; 02/02/23: Pt demos self passive stretching at the shoulder in a couple different planes (horiz add, flexion); pt has been encouraged to perform towel squeezes for grip strengthening, but pt reports she has not yet done this at home. Goal status: ongoing  LONG TERM GOALS: Target date: 03/07/2023  Pt. Will increase FOTO score by 2 points for Pt. perceived improvement with assessment specific ADL/IADL improvement. Baseline: Eval: FOTO 33, TR score: 39; 02/02/23: FOTO 44 Goal status: achieved/ongoing  2.  Pt. Will improve left shoulder ROM by 10 degrees to assist with self-dressing. Baseline: Left shoulder flexion: 0(93), Abduction: 30(83); 02/02/23: L shoulder flexion 0 (90), abd 35 (90) Goal status: ongoing  3.  Pt. Will improve left wrist extension by 10 in preparation for functional reaching Baseline: Left wrist  extension: 10(50); 02/02/23: L wrist ext 0 (58) Goal status: ongoing  4.  Pt. Will increase left grip strength to 30# or more in preparation for being able to securely stabilize ADL/IADL items at the tabletop. (revised from 5# on 02/02/23) Baseline: Left grip strength: 10#; 02/02/23: L 25# Goal status: revised/ongoing  5. Pt will perform tub transfer with leg lifter and transfer tub bench with supv/set up. Baseline: Min A with sit<>stand from tub bench, mod A to lift L leg over tub; 02/02/23: supv with sit<>stand from tub bench, min A to lift L leg over tub with use of leg lifter ; at night more tired michelle helps to lift legs, in the a.m pt can use leg lifter to lift legs over tub  Goal status: ongoing  6. Pt will perform UB bathing with min A.  Baseline: Mod A; 02/02/23: min A  Goal status: achieved  ASSESSMENT: CLINICAL IMPRESSION:   Pt tolerated LUE therapeutic exercises well this date with intermittent drowsiness. Pt. Continues to present with increased alertness when listening to [place] music, which was provided throughout session. Pt. Responded well to contrast bath, and manual therapy.  Pt. Presented with 3/10 right hand pain initially, and improved by the end of the session following treatment. Pt continues to require OT assist to stabilize LUE to reduce use of accessory muscle groups during LUE therapeutic exercises.  Continue OT to facilitate increased active movement of left UE to improve independence in necessary daily tasks.     PERFORMANCE DEFICITS: in functional skills including ADLs, IADLs, coordination, dexterity, ROM, strength, pain, Fine motor control, and Gross motor control, cognitive skills including attention, problem solving, and safety awareness, and psychosocial skills including coping strategies, environmental adaptation, and routines and behaviors.   IMPAIRMENTS: are limiting patient from ADLs, IADLs, leisure, and social participation.   CO-MORBIDITIES: may have  co-morbidities  that affects occupational performance. Patient will benefit from skilled OT to address above impairments and improve overall function.  MODIFICATION OR ASSISTANCE TO COMPLETE EVALUATION: Maximum or significant modification of tasks or assist is necessary to complete an evaluation.  OT OCCUPATIONAL PROFILE AND HISTORY: Comprehensive assessment: Review of records and extensive additional review of physical, cognitive, psychosocial history related to current functional performance.  CLINICAL DECISION MAKING: High - multiple treatment options, significant modification of task necessary  REHAB POTENTIAL: Good  EVALUATION COMPLEXITY: High  PLAN:  OT FREQUENCY: 2x/week  OT DURATION: 12 weeks  PLANNED INTERVENTIONS:  self care/ADL training, therapeutic exercise, therapeutic activity, neuromuscular re-education, manual therapy, passive range of motion, functional mobility training, electrical stimulation, and paraffin  RECOMMENDED OTHER SERVICES: OT  CONSULTED AND AGREED WITH PLAN OF CARE: Patient  PLAN FOR NEXT SESSION: therapeutic exercises, ADL/AE training   Olegario Messier, MS, OTR/L   03/01/2023 11:34

## 2023-03-10 NOTE — Therapy (Signed)
OUTPATIENT PHYSICAL THERAPY NEURO TREATMENT NOTE    Patient Name: Dana Lucas MRN: [id number] DOB:04/26/1951, 72 y.o., female Today's Date: 03/10/2023   PCP: Bobbye Morton MD REFERRING PROVIDER: Louis Matte MD   END OF SESSION:  PT End of Session - 03/10/23 0900     Visit Number 17    Number of Visits 38    Date for PT Re-Evaluation 05/24/23    Authorization Type UHC Medicare; [place] Medicaid    Authorization Time Period 03/01/23-05/24/23    PT Start Time 0845    PT Stop Time 0925    PT Time Calculation (min) 40 min    Equipment Utilized During Treatment Gait belt    Activity Tolerance Patient tolerated treatment well;No increased pain    Behavior During Therapy WFL for tasks assessed/performed                    Past Medical History:  Diagnosis Date   Diabetes mellitus without complication (HCC)    Hypertension    Stroke [facility])    Past Surgical History:  Procedure Laterality Date   ABDOMINAL HYSTERECTOMY     COLONOSCOPY N/A 10/06/2019   Procedure: COLONOSCOPY;  Surgeon: Toledo, Boykin Nearing, MD;  Location: [facility] ENDOSCOPY;  Service: Gastroenterology;  Laterality: N/A;   Patient Active Problem List   Diagnosis Date Noted   Hypertension    Diabetes mellitus without complication (HCC)    Stroke (HCC)    GIB (gastrointestinal bleeding)    AKI (acute kidney injury) (HCC)    UTI (urinary tract infection)     ONSET DATE: 2020  REFERRING DIAG: 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   THERAPY DIAG:  Muscle weakness (generalized)  Other lack of coordination  Difficulty in walking, not elsewhere classified  Unsteadiness on feet  Rationale for Evaluation and Treatment: Rehabilitation  SUBJECTIVE:                                                                                                                                                                                             SUBJECTIVE STATEMENT: Pt reports  no current updates/concerns. Pt still waiting for AFO form. Pt has been doing well.   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 Stroke was four years ago, has had multiple strokes. Had a little bit of therapy, afterwards in [place]. Moved up to [place] about three years ago. [name] includes DM with diabetic neuropathy, stroke, HTN, AKI, UTI. Uses a walker to walk to the bathroom (  two wheel and three wheel walkers).  Has a power chair for out of the house.   PAIN:  Are you having pain? Yes Right hand    PRECAUTIONS: Fall  WEIGHT BEARING RESTRICTIONS: No  FALLS: Has patient fallen in last 6 months? No  LIVING ENVIRONMENT: Lives with: lives with their family Lives in: House/apartment Stairs:  ramp outside Has following equipment at home: [name] - 2 wheeled, Environmental consultant - 4 wheeled, Wheelchair (power), shower chair, Ramped entry, and chair lift and hospital bed   PLOF: Independent  PATIENT GOALS: get up and walk by herself, wants to walk with a cane.   OBJECTIVE:   DIAGNOSTIC FINDINGS: CVA in [place]; no imaging in system  COGNITION: Overall cognitive status:  a little bit per granddaughter.    SENSATION: WFL  COORDINATION: Limited LLE     POSTURE: rounded shoulders, forward head, flexed trunk , and weight shift right    LOWER EXTREMITY MMT:    MMT Right Eval Left Eval  Hip flexion 4 2  Hip extension    Hip abduction 4 2+  Hip adduction 4 2+  Knee flexion 4 2-  Knee extension 4 2+  Ankle dorsiflexion 3   Ankle plantarflexion 3   (Blank rows = not tested)   TRANSFERS: Assistive device utilized: Environmental consultant - 2 wheeled  Sit to stand: CGA and Min A; blocking of L foot  Stand to sit: CGA Chair to chair: Min A    GAIT: Gait pattern: step to pattern, decreased stance time- Left, decreased ankle dorsiflexion- Left, and poor foot clearance- Left Distance  walked: 56ft Assistive device utilized: Environmental consultant - 2 wheeled Level of assistance: CGA Comments: L knee hyperextension with weightbearing  FUNCTIONAL TESTS:  5 times sit to stand: 38 seconds with SUE support  10 meter walk test: 1 min 43 seconds with RW  PATIENT SURVEYS:  FOTO 46  TODAY'S TREATMENT:                                                                                                                              DATE: 03/10/23  -MinA STS xfer from powerchair to RW, 55ft AMB minGUard assist to nustep, stand to sit minA to [employer] -Assist with adjustment and mounting nustep; seat 7, arms 9, modA of Left arm to get to handle, Left foot ratchet strapped to pedal which is put into additional plantarflexion angle to accommodate ROM loss, towel behind T spine so she can reach without having to sit unsupported  -AMB overground with RW, minA to rise, 70ft step to gait at minA: 3 bouts with 2-3 minute recovery between.     PATIENT EDUCATION: Education details: Pt educated throughout session about proper posture and technique with exercises. Improved exercise technique, movement at target joints, use of target muscles after min to mod verbal, visual, tactile cues.   Person educated: Patient and granddaughter Education method: Explanation, Demonstration, Tactile cues, and Verbal cues Education comprehension: verbalized understanding,  returned demonstration, verbal cues required, tactile cues required, and needs further education  HOME EXERCISE PROGRAM: Access Code: [email or web address] URL: [email or web address] Date: 12/06/2022 Prepared by: Precious Bard  Exercises - Leg Extension  - 1 x daily - 7 x weekly - 2 sets - 10 reps - 5 hold - Seated March  - 1 x daily - 7 x weekly - 2 sets - 10 reps - 5 hold - Seated Hip Abduction  - 1 x daily - 7 x weekly - 2 sets - 10 reps - 5 hold - Seated Hip Adduction Isometrics with Ball  - 1 x daily - 7 x weekly - 2 sets - 10 reps - 5 hold - Seated  Weight Shifting Without Arm Support  - 1 x daily - 7 x weekly - 2 sets - 10 reps - 5 hold   GOALS: Goals reviewed with patient? Yes  SHORT TERM GOALS: Target date: 01/03/2023    Patient will be independent in home exercise program to improve strength/mobility for better functional independence with ADLs. Baseline: 01/26/2023: pt has some difficulty with HEP Goal status: IN PROGRESS    LONG TERM GOALS: Target date: 02/28/2023    Patient will increase FOTO score to equal to or greater than 53    to demonstrate statistically significant improvement in mobility and quality of life.  Baseline: 3/5: 46%; 01/26/23: 45% ; 03/01/23: 42%  Goal status: IN PROGRESS  2.  Patient (> 37 years old) will complete five times sit to stand test in < 15 seconds indicating an increased LE strength and improved balance. Baseline: 3/5: 38 seconds with SUE support; blocking of LLE; 01/26/23 18 seconds with pulling to stand; 03/01/23: 17 seconds pull to stand  Goal status: Partially MET  3.  Patient will increase 10 meter walk test to >1.62m/s as to improve gait speed for better community ambulation and to reduce fall risk Baseline: 3/5: 1 min 43 seconds with RW and w/c follow; 01/26/2023: 2 min 6 seconds; 03/01/23: 1 min 11 sec, 0.14 m/s with WC follow and 2WW  Goal status: IN PROGRESS  4.  Patient will increase BLE gross strength to 4/5 as to improve functional strength for independent gait, increased standing tolerance and increased ADL ability. Baseline: 3/5: grossly 2/5 LLE; 4//25/24: RLE grossly 4-/5, LLE grossly 2/5; 02/28/22 RLE grossly 4+/5 and LLE grossly 3+/5, exception 0/5 PF/DF (overall gross Bilat LE strength around 4-/5) Goal status: PARTIALLY MET    ASSESSMENT:  CLINICAL IMPRESSION: Continued to work on gait and transfers. Pt fatigues quickly but knows her limits. Pt remain safe and steady with RW despite Left foot drop. Pt very motivated to work hard, but communicates need for breaks prn. Pt will  continue to benefit from skilled physical therapy intervention to address impairments, improve QOL, and attain therapy goals.    OBJECTIVE IMPAIRMENTS: Abnormal gait, cardiopulmonary status limiting activity, decreased activity tolerance, decreased balance, decreased coordination, decreased endurance, decreased knowledge of use of DME, decreased mobility, difficulty walking, decreased ROM, decreased strength, impaired perceived functional ability, impaired flexibility, impaired UE functional use, improper body mechanics, postural dysfunction, obesity, and pain.   ACTIVITY LIMITATIONS: carrying, lifting, bending, sitting, standing, squatting, sleeping, stairs, transfers, bed mobility, bathing, toileting, dressing, reach over head, and caring for others  PARTICIPATION LIMITATIONS: meal prep, cleaning, laundry, interpersonal relationship, driving, shopping, community activity, and church  PERSONAL FACTORS: Age, Behavior pattern, Education, Fitness, Past/current experiences, Profession, Sex, Time since onset of injury/illness/exacerbation, and 3+ comorbidities: DM with diabetic neuropathy, stroke, HTN,  AKI, UTI  are also affecting patient's functional outcome.   REHAB POTENTIAL: Fair length of time since CVA  CLINICAL DECISION MAKING: Evolving/moderate complexity  EVALUATION COMPLEXITY: Moderate  PLAN:  PT FREQUENCY: 2x/week  PT DURATION: 12 weeks  PLANNED INTERVENTIONS: Therapeutic exercises, Therapeutic activity, Neuromuscular re-education, Balance training, Gait training, Patient/Family education, Self Care, Joint mobilization, Stair training, Vestibular training, Canalith repositioning, Visual/preceptual remediation/compensation, Orthotic/Fit training, DME instructions, Cognitive remediation, Electrical stimulation, Wheelchair mobility training, Spinal mobilization, Cryotherapy, Moist heat, Splintting, Taping, Traction, Ultrasound, Manual therapy, and Re-evaluation  PLAN FOR NEXT SESSION:  stand in // bars, work on walking, strengthening LLE.  Kaven Cumbie C, PT 03/10/2023, 9:17 AM   9:29 AM, 03/10/23 Rosamaria Lints, PT, DPT Physical Therapist - [facility]  [phone] [facility])

## 2023-03-14 ENCOUNTER — Ambulatory Visit: Payer: 59 | Admitting: Occupational Therapy

## 2023-03-14 ENCOUNTER — Ambulatory Visit: Payer: 59

## 2023-03-15 ENCOUNTER — Ambulatory Visit: Payer: 59

## 2023-03-15 DIAGNOSIS — R262 Difficulty in walking, not elsewhere classified: Secondary | ICD-10-CM

## 2023-03-15 DIAGNOSIS — G8929 Other chronic pain: Secondary | ICD-10-CM

## 2023-03-15 DIAGNOSIS — R2681 Unsteadiness on feet: Secondary | ICD-10-CM

## 2023-03-15 DIAGNOSIS — R278 Other lack of coordination: Secondary | ICD-10-CM

## 2023-03-15 DIAGNOSIS — M6281 Muscle weakness (generalized): Secondary | ICD-10-CM

## 2023-03-15 NOTE — Therapy (Signed)
OUTPATIENT PHYSICAL THERAPY NEURO TREATMENT NOTE    Patient Name: Dana Lucas MRN: [id number] DOB:1951-09-22, 72 y.o., female Today's Date: 03/15/2023   PCP: Bobbye Morton MD REFERRING PROVIDER: Louis Matte MD   END OF SESSION:  PT End of Session - 03/15/23 0843     Visit Number 18    Number of Visits 38    Date for PT Re-Evaluation 05/24/23    Authorization Type UHC Medicare; [place] Medicaid    Authorization Time Period 03/01/23-05/24/23    PT Start Time 0850    PT Stop Time 0930    PT Time Calculation (min) 40 min    Equipment Utilized During Treatment Gait belt    Activity Tolerance Patient tolerated treatment well;Patient limited by pain    Behavior During Therapy WFL for tasks assessed/performed                    Past Medical History:  Diagnosis Date   Diabetes mellitus without complication (HCC)    Hypertension    Stroke [facility])    Past Surgical History:  Procedure Laterality Date   ABDOMINAL HYSTERECTOMY     COLONOSCOPY N/A 10/06/2019   Procedure: COLONOSCOPY;  Surgeon: Toledo, Boykin Nearing, MD;  Location: [facility] ENDOSCOPY;  Service: Gastroenterology;  Laterality: N/A;   Patient Active Problem List   Diagnosis Date Noted   Hypertension    Diabetes mellitus without complication (HCC)    Stroke (HCC)    GIB (gastrointestinal bleeding)    AKI (acute kidney injury) (HCC)    UTI (urinary tract infection)     ONSET DATE: 2020  REFERRING DIAG: 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   THERAPY DIAG:  Muscle weakness (generalized)  Chronic pain of right knee  Unsteadiness on feet  Difficulty in walking, not elsewhere classified  Other lack of coordination  Rationale for Evaluation and Treatment: Rehabilitation  SUBJECTIVE:                                                                                                                                                                                              SUBJECTIVE STATEMENT: Pt  reports current R knee and hand pain. No other updates or concerns, no falls.  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 Stroke was four years ago, has had multiple strokes. Had a little bit of therapy, afterwards in [place]. Moved up to [place] about three years ago. [name] includes DM with diabetic neuropathy, stroke, HTN, AKI, UTI. Uses a  walker to walk to the bathroom (two wheel and three wheel walkers).  Has a power chair for out of the house.   PAIN:  Are you having pain? Yes Right hand    PRECAUTIONS: Fall  WEIGHT BEARING RESTRICTIONS: No  FALLS: Has patient fallen in last 6 months? No  LIVING ENVIRONMENT: Lives with: lives with their family Lives in: House/apartment Stairs:  ramp outside Has following equipment at home: [name] - 2 wheeled, Environmental consultant - 4 wheeled, Wheelchair (power), shower chair, Ramped entry, and chair lift and hospital bed   PLOF: Independent  PATIENT GOALS: get up and walk by herself, wants to walk with a cane.   OBJECTIVE:   DIAGNOSTIC FINDINGS: CVA in [place]; no imaging in system  COGNITION: Overall cognitive status:  a little bit per granddaughter.    SENSATION: WFL  COORDINATION: Limited LLE     POSTURE: rounded shoulders, forward head, flexed trunk , and weight shift right    LOWER EXTREMITY MMT:    MMT Right Eval Left Eval  Hip flexion 4 2  Hip extension    Hip abduction 4 2+  Hip adduction 4 2+  Knee flexion 4 2-  Knee extension 4 2+  Ankle dorsiflexion 3   Ankle plantarflexion 3   (Blank rows = not tested)   TRANSFERS: Assistive device utilized: Environmental consultant - 2 wheeled  Sit to stand: CGA and Min A; blocking of L foot  Stand to sit: CGA Chair to chair: Min A    GAIT: Gait pattern: step to pattern, decreased stance time- Left, decreased ankle dorsiflexion- Left, and poor foot  clearance- Left Distance walked: 40ft Assistive device utilized: Environmental consultant - 2 wheeled Level of assistance: CGA Comments: L knee hyperextension with weightbearing  FUNCTIONAL TESTS:  5 times sit to stand: 38 seconds with SUE support  10 meter walk test: 1 min 43 seconds with RW  PATIENT SURVEYS:  FOTO 46  TODAY'S TREATMENT:                                                                                                                              DATE: 03/15/23  TE: Ambulation 6 ft 2x with HW, close CGA WC<>standard chair  STS 5x with use of UUE push off chair- pain-limited in R knee and R hand.  PT instructs pt in forward lean/reach technique and pt able to complete 3x6 STS hands-free, reports no knee pain. Pt rates medium, exhibits fatigue  Standing lateral stepping at support bar 1x5, 1x3 length of bar  1.5# AW donned each LE LAQ 3x15 each LE, TC throughout to assist with correct technique. Limited by tone LLE.  STS 3x4 hands-free   Seated crunch and oblique twist 10x   Seated march 2x10 with TC/VC for LLE   PT provided pt with AFO form signed by her physician. Instructed pt and daughter in next-steps to make appointment with local orthotist and bring signed form to appointment. Pt and her  daughter verbalized understanding.   PATIENT EDUCATION: Education details: Pt educated throughout session about proper posture and technique with exercises. Improved exercise technique, movement at target joints, use of target muscles after min to mod verbal, visual, tactile cues.   Person educated: Patient and daughter [name] Education method: Explanation, Demonstration, Tactile cues, and Verbal cues Education comprehension: verbalized understanding, returned demonstration, verbal cues required, tactile cues required, and needs further education  HOME EXERCISE PROGRAM: Access Code: [email or web address] URL: [email or web address] Date: 12/06/2022 Prepared by: Precious Bard  Exercises - Leg Extension  - 1 x daily - 7 x weekly - 2 sets - 10 reps - 5 hold - Seated March  - 1 x daily - 7 x weekly - 2 sets - 10 reps - 5 hold - Seated Hip Abduction  - 1 x daily - 7 x weekly - 2 sets - 10 reps - 5 hold - Seated Hip Adduction Isometrics with Ball  - 1 x daily - 7 x weekly - 2 sets - 10 reps - 5 hold - Seated Weight Shifting Without Arm Support  - 1 x daily - 7 x weekly - 2 sets - 10 reps - 5 hold   GOALS: Goals reviewed with patient? Yes  SHORT TERM GOALS: Target date: 01/03/2023    Patient will be independent in home exercise program to improve strength/mobility for better functional independence with ADLs. Baseline: 01/26/2023: pt has some difficulty with HEP Goal status: IN PROGRESS    LONG TERM GOALS: Target date: 02/28/2023    Patient will increase FOTO score to equal to or greater than 53    to demonstrate statistically significant improvement in mobility and quality of life.  Baseline: 3/5: 46%; 01/26/23: 45% ; 03/01/23: 42%  Goal status: IN PROGRESS  2.  Patient (> 58 years old) will complete five times sit to stand test in < 15 seconds indicating an increased LE strength and improved balance. Baseline: 3/5: 38 seconds with SUE support; blocking of LLE; 01/26/23 18 seconds with pulling to stand; 03/01/23: 17 seconds pull to stand  Goal status: Partially MET  3.  Patient will increase 10 meter walk test to >1.51m/s as to improve gait speed for better community ambulation and to reduce fall risk Baseline: 3/5: 1 min 43 seconds with RW and w/c follow; 01/26/2023: 2 min 6 seconds; 03/01/23: 1 min 11 sec, 0.14 m/s with WC follow and 2WW  Goal status: IN PROGRESS  4.  Patient will increase BLE gross strength to 4/5 as to improve functional strength for independent gait, increased standing tolerance and increased ADL ability. Baseline: 3/5: grossly 2/5 LLE; 4//25/24: RLE grossly 4-/5, LLE grossly 2/5; 02/28/22 RLE grossly 4+/5 and LLE grossly 3+/5, exception  0/5 PF/DF (overall gross Bilat LE strength around 4-/5) Goal status: PARTIALLY MET    ASSESSMENT:  CLINICAL IMPRESSION: PT provides pt with AFO form signed by her physician this visit and instructs in next steps (see above). Pt showed improvement today by completing multiple STS reps hands-free, indicating increased LE strength. The pt will continue to benefit from skilled physical therapy intervention to address impairments, improve QOL, and attain therapy goals.    OBJECTIVE IMPAIRMENTS: Abnormal gait, cardiopulmonary status limiting activity, decreased activity tolerance, decreased balance, decreased coordination, decreased endurance, decreased knowledge of use of DME, decreased mobility, difficulty walking, decreased ROM, decreased strength, impaired perceived functional ability, impaired flexibility, impaired UE functional use, improper body mechanics, postural dysfunction, obesity, and pain.   ACTIVITY LIMITATIONS: carrying, lifting, bending, sitting, standing,  squatting, sleeping, stairs, transfers, bed mobility, bathing, toileting, dressing, reach over head, and caring for others  PARTICIPATION LIMITATIONS: meal prep, cleaning, laundry, interpersonal relationship, driving, shopping, community activity, and church  PERSONAL FACTORS: Age, Behavior pattern, Education, Fitness, Past/current experiences, Profession, Sex, Time since onset of injury/illness/exacerbation, and 3+ comorbidities: DM with diabetic neuropathy, stroke, HTN, AKI, UTI  are also affecting patient's functional outcome.   REHAB POTENTIAL: Fair length of time since CVA  CLINICAL DECISION MAKING: Evolving/moderate complexity  EVALUATION COMPLEXITY: Moderate  PLAN:  PT FREQUENCY: 2x/week  PT DURATION: 12 weeks  PLANNED INTERVENTIONS: Therapeutic exercises, Therapeutic activity, Neuromuscular re-education, Balance training, Gait training, Patient/Family education, Self Care, Joint mobilization, Stair training,  Vestibular training, Canalith repositioning, Visual/preceptual remediation/compensation, Orthotic/Fit training, DME instructions, Cognitive remediation, Electrical stimulation, Wheelchair mobility training, Spinal mobilization, Cryotherapy, Moist heat, Splintting, Taping, Traction, Ultrasound, Manual therapy, and Re-evaluation  PLAN FOR NEXT SESSION: stand in // bars, work on walking, strengthening LLE, STS  Baird Kay, PT 03/15/2023, 9:41 AM   9:41 AM, 03/15/23    Physical Therapist - [facility]  [phone] [facility])

## 2023-03-16 ENCOUNTER — Encounter: Payer: 59 | Admitting: Occupational Therapy

## 2023-03-16 NOTE — Therapy (Signed)
OUTPATIENT OCCUPATIONAL THERAPY NEURO NOTE  Patient Name: Dana Lucas MRN: [id number] DOB:August 06, 1951, 72 y.o., female  PCP: Hinda Lenis, MD REFERRING PROVIDER: Sumner Boast, MD  END OF SESSION:  OT End of Session - 03/16/23 2008     Visit Number 19    Number of Visits 24    Date for OT Re-Evaluation 04/21/23    Authorization Type Progress report period starting 02/02/23    Progress Note Due on Visit 10    OT Start Time 0930    OT Stop Time 1015    OT Time Calculation (min) 45 min    Equipment Utilized During Treatment power wc    Activity Tolerance Patient tolerated treatment well    Behavior During Therapy WFL for tasks assessed/performed            Past Medical History:  Diagnosis Date   Diabetes mellitus without complication (HCC)    Hypertension    Stroke [facility])    Past Surgical History:  Procedure Laterality Date   ABDOMINAL HYSTERECTOMY     COLONOSCOPY N/A 10/06/2019   Procedure: COLONOSCOPY;  Surgeon: Toledo, Boykin Nearing, MD;  Location: [facility] ENDOSCOPY;  Service: Gastroenterology;  Laterality: N/A;   Patient Active Problem List   Diagnosis Date Noted   Hypertension    Diabetes mellitus without complication (HCC)    Stroke (HCC)    GIB (gastrointestinal bleeding)    AKI (acute kidney injury) (HCC)    UTI (urinary tract infection)    ONSET DATE: 05/2014  REFERRING DIAG: CVA  THERAPY DIAG:  Muscle weakness (generalized)  Other lack of coordination  Rationale for Evaluation and Treatment: Rehabilitation  SUBJECTIVE:  SUBJECTIVE STATEMENT: Pt extremely drowsy and reported that she did not sleep very well last night. Pt accompanied by: family member , DIL [name])  PERTINENT HISTORY: Pt. is a 72 y.o. female who has a history of multiple CVAs beginning in 2015. Pt. 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   PRECAUTIONS: None  WEIGHT BEARING RESTRICTIONS: No  PAIN:  Are you having pain? 3/10 right hand  arthritic pain; improved after moist heat, manual therapy, and contrast bath. FALLS: Has patient fallen in last 6 months? No  LIVING ENVIRONMENT: Lives with: lives with their family and lives with their son Lives in: House/apartment Stairs: one story, ramped entrance Has following equipment at home: Wheelchair (power) and Wheelchair (manual), 2 wheeled walker, 3 wheeled walker, power lift, hospital bed, transfer shower bench  PLOF: Independent  PATIENT GOALS: To be able to walk again, and to drive  OBJECTIVE:   HAND DOMINANCE: Right  ADLs: Transfers/ambulation related to ADLs: Eating: Independent Grooming: Independent UB Dressing: Independent LB Dressing: Independent pants, MaxA shoes, and socks Toileting: Assist with transfers, reports independence with toileting care Bathing: Assist from DIL Tub Shower transfers: Min-ModA shower transfers Equipment: See above  IADLs: Shopping: DIL performs shopping Light housekeeping: Unable Meal Prep: Pt. Is able to retrieve a beverage, or snack, light meal prep Community mobility: Power w/c Medication management: Grand daughter assists with pillbox set-up.  Pt. Independently takes medication Financial management: No changes Handwriting: Name: 100% legible in printed form, 75% in cursive form  MOBILITY STATUS: Needs Assist: Uses power w/c  POSTURE COMMENTS:  Sitting balance: WFL supported  FUNCTIONAL OUTCOME MEASURES: FOTO: 33 TR:39 02/02/23: 44 03/10/23: 40  UPPER EXTREMITY ROM:    Active ROM Right WFL Left eval Left 02/02/23 Left  03/10/23  Shoulder flexion  0(93) 0 (90) 0 (90)  Shoulder abduction  30(83) 35 (90)  40 (90)  Shoulder adduction      Shoulder extension      Shoulder internal rotation      Shoulder external rotation      Elbow flexion  0-87(0-122) 80   Elbow extension   -40   Wrist flexion  52 82   Wrist extension  10(50) 0 (58) 0 (70)  Wrist ulnar deviation      Wrist radial deviation      Wrist pronation       Wrist supination      (Blank rows = not tested)  Full Digit flexion to the [facility] Active digit extension through 75% of the range grossly Active thumb radial, and palmar abduction  03/10/23: Active digit extension through full range grossly through digits 1-5  UPPER EXTREMITY MMT:     MMT Right eval Left eval Left 02/02/23 Left 03/10/23  Shoulder flexion  0/5 0 0  Shoulder abduction  2-/5 2- 2-  Shoulder adduction      Shoulder extension      Shoulder internal rotation      Shoulder external rotation      Middle trapezius      Lower trapezius      Elbow flexion  3/5 3- 3-  Elbow extension      Wrist flexion      Wrist extension  2/5 2 3-  Wrist ulnar deviation      Wrist radial deviation      Wrist pronation      Wrist supination      (Blank rows = not tested)  HAND FUNCTION: Grip strength: Right: 18 lbs; Left: 10 lbs, Lateral pinch: Right: 5 lbs, Left: 11 lbs, and 3 point pinch: Right: 1 lbs, Left: 15 lbs 02/02/23: Grip strength: Right: 8 lbs (limited by pain); Left: 25 lbs, Lateral pinch: Right: 5 lbs, Left: 12 lbs, and 3 point pinch: Right: 4 lbs, Left: 6 lbs 03/10/23: Grip strength: Right: 12 lbs (limited by pain); Left: 25 lbs, Lateral pinch: Right: 8 lbs, Left: 12 lbs, and 3 point pinch: Right: 7 lbs, Left: 5 lbs  COORDINATION: Eval: N/A 02/02/23: 9 hole R: 42 sec; L: (unable) Able to pick up 1 peg and place in hole  03/10/23: R unable: Pt able to pick up 1 peg but unable to place in hole; pt can remove a peg from the hole  SENSATION: WFL  EDEMA: TBD  MUSCLE TONE: LUE: Hypotonic  COGNITION: Overall cognitive status: Within functional limits for tasks assessed  VISION: Subjective report:  Pt. Reports having an Eye appointment Baseline vision: Wears glasses all the time  VISION ASSESSMENT: To be further assessed in functional context  PRAXIS: Impaired: Motor planning  TODAY'S TREATMENT:   Self Care: Provided education on 1 handed AE meal prep utensils and  options to obtain if desired, including rocker knife and 1 handed cutting board with spikes and corner ridges.  Demonstrated use of dycem to stabilize plates/bowls; will plan to issue.  Participation in meal prep tasks, including standing at sink to wash hands, functional reaching into 1st and 2nd shelves of cupboard, and item transport across countertop.  Practiced safe item handling, using hot pad to slide pot of hot water across countertop to drain.  Pt required min A for sit stand from wc to supported standing at countertop, min guard for balance with functional reaching and item transport, and min A to position LUE at countertop during above noted activities.  Completed LUE sensory assessment to identify level of safety risk during meal prep activities.  See clinical impression below.    Pt. Education details: 1 handed AE tools for meal prep Person educated: Patient, daughter-in-law Education method: explanation, vc, visual from internet Education comprehension: verbalized understanding, receptive to education   HOME EXERCISE PROGRAM: Towel squeezes grip strengthening, seated marches and knee extension (lifting legs over tub)  GOALS: Goals reviewed with patient? Yes  SHORT TERM GOALS: Target date: 03/31/23 (3 weeks)   Pt. Will demonstrate independence with HEPs for the LUE. Baseline: Eval: No current HEP; 02/02/23: Pt demos self passive stretching at the shoulder in a couple different planes (horiz add, flexion); pt has been encouraged to perform towel squeezes for grip strengthening, but pt reports she has not yet done this at home; 03/10/23: Pt verbalizes/demos understanding of table slides for LUE mobility and towel squeezes for L grip strengthening, though uncertain of frequency of participation at home. Goal status: achieved  LONG TERM GOALS: Target date: 04/21/23 (6 weeks)  Pt. Will increase FOTO score by 2 points for Pt. perceived improvement with assessment specific ADL/IADL  improvement. Baseline: Eval: FOTO 33, TR score: 39; 02/02/23: FOTO 44; 03/10/23: 40 Goal status: achieved/ongoing  2.  Pt. Will improve left shoulder ROM by 10 degrees to assist with self-dressing. Baseline: Left shoulder flexion: 0(93), Abduction: 30(83); 02/02/23: L shoulder flexion 0 (90), abd 35 (90); 03/10/23: L shoulder flexion 0 (90), abd 40 (90) Goal status: Not met/discontinue  3.  Pt. Will improve left wrist extension by 10 in preparation for functional reaching Baseline: Left wrist extension: 10(50); 02/02/23: L wrist ext 0 (58); 03/10/23: L wrist ext active to neutral (passively to 70) Goal status: Not met/discontinue  4.  Pt. Will increase left grip strength to 30# or more in preparation for being able to securely stabilize ADL/IADL items at the tabletop. (revised from 5# on 02/02/23) Baseline: Left grip strength: 10#; 02/02/23: L 25#; 03/10/23: L grip 25# Goal status: ongoing  5. Pt will perform tub transfer with leg lifter and transfer tub bench with supv/set up. Baseline: Min A with sit<>stand from tub bench, mod A to lift L leg over tub; 02/02/23: supv with sit<>stand from tub bench, min A to lift L leg over tub with use of leg lifter; 03/10/23: DIL helps to lift legs over tub if performing in the p.m. d/t fatigue end of day, in the a.m pt can use leg lifter to lift legs over tub with supv/set up while seated on transfer tub bench  Goal status: partially met; discontinue  6. Pt will perform UB bathing with min A.  Baseline: Mod A; 02/02/23: min A; 03/10/23: capable of performing with min A, but DIL typically does all the washing.  Encouraged pt to increase participation for maximizing overall strength, activity tolerance, and indep with ADLs.  Goal status: achieved  7. Pt will utilize walker tray/bag to be able to transport food/drink items from refrigerator to table top with modified indep.   Baseline: Recert 03/10/23: Family manages food/drink set up; walker bag issued this date and educ on walker  tray options.  Goal status: New  8. Pt will perform simple cold and hot meal prep with distant supv using RW and walker tray or bag while seated or while standing up to 5 min.    Baseline: Recert 03/10/23: Pt able to stand 5 min (mostly static) with RW; family manages all meal prep.   Goal status: New  ASSESSMENT: CLINICAL IMPRESSION:  Pt very drowsy this date, reporting she hadn't slept very well last night.  Improved attention to task with meal prep activities.  Pt was provided with education on 1 handed AE items for meal prep and was advised on options to obtain (1 handed cutting board and rocker knife).  Pt reported that she had not yet utilized her walker bag which was issued last session.  Pt required min A for sit stand from wc to supported standing at countertop, min guard for balance with functional reaching and item transport, and min A to position LUE at countertop during above noted activities.  Pt required min A to safely position LUE at countertop and mod vc to position LUE a safe distance from a hot pot of water.  Pt did not remember to turn off stove top burner when promoted, "Is there anything else we need to take care of before we finish?"  LUE sensory assessment completed to assess level of safety risk during meal prep activities.  Pt demonstrated good proprioceptive awareness, mildly diminished light touch at the fingertips of L LF and RF, and good stereognosis (naming 5/5 items correctly in L hand with vision occluded).  Will continue to reinforce safety with hot meal prep.  Will continue to focus on dynamic standing tolerance in kitchen setting, functional reaching/item transport, and light hot/cold meal prep activities.    PERFORMANCE DEFICITS: in functional skills including ADLs, IADLs, coordination, dexterity, ROM, strength, pain, Fine motor control, and Gross motor control, cognitive skills including attention, problem solving, and safety awareness, and psychosocial skills  including coping strategies, environmental adaptation, and routines and behaviors.   IMPAIRMENTS: are limiting patient from ADLs, IADLs, leisure, and social participation.   CO-MORBIDITIES: may have co-morbidities  that affects occupational performance. Patient will benefit from skilled OT to address above impairments and improve overall function.  MODIFICATION OR ASSISTANCE TO COMPLETE EVALUATION: Maximum or significant modification of tasks or assist is necessary to complete an evaluation.  OT OCCUPATIONAL PROFILE AND HISTORY: Comprehensive assessment: Review of records and extensive additional review of physical, cognitive, psychosocial history related to current functional performance.  CLINICAL DECISION MAKING: High - multiple treatment options, significant modification of task necessary  REHAB POTENTIAL: Good  EVALUATION COMPLEXITY: High  PLAN:  OT FREQUENCY: 1x/week  OT DURATION: 6 weeks  PLANNED INTERVENTIONS: self care/ADL training, therapeutic exercise, therapeutic activity, neuromuscular re-education, manual therapy, passive range of motion, functional mobility training, electrical stimulation, and paraffin  RECOMMENDED OTHER SERVICES: OT  CONSULTED AND AGREED WITH PLAN OF CARE: Patient  PLAN FOR NEXT SESSION: therapeutic exercises, ADL/AE training   Danelle Earthly, MS, OTR/L

## 2023-03-17 ENCOUNTER — Ambulatory Visit: Payer: 59

## 2023-03-17 DIAGNOSIS — M6281 Muscle weakness (generalized): Secondary | ICD-10-CM | POA: Diagnosis not present

## 2023-03-17 DIAGNOSIS — R2681 Unsteadiness on feet: Secondary | ICD-10-CM

## 2023-03-17 DIAGNOSIS — R262 Difficulty in walking, not elsewhere classified: Secondary | ICD-10-CM

## 2023-03-17 DIAGNOSIS — R278 Other lack of coordination: Secondary | ICD-10-CM

## 2023-03-17 DIAGNOSIS — G8929 Other chronic pain: Secondary | ICD-10-CM

## 2023-03-17 NOTE — Therapy (Signed)
OUTPATIENT PHYSICAL THERAPY NEURO TREATMENT     Patient Name: Dana Lucas MRN: [id number] DOB:[birth year], 72 y.o., female Today's Date: 03/17/2023   PCP: Bobbye Morton MD REFERRING PROVIDER: Louis Matte MD   END OF SESSION:  PT End of Session - 03/17/23 0812     Visit Number 19    Number of Visits 38    Date for PT Re-Evaluation 05/24/23    Authorization Type [employer] Medicare;  Medicaid    Authorization Time Period 03/01/23-05/24/23    Progress Note Due on Visit 20    PT Start Time 0800    PT Stop Time 0840    PT Time Calculation (min) 40 min    Equipment Utilized During Treatment Gait belt    Activity Tolerance Patient tolerated treatment well;Patient limited by pain    Behavior During Therapy WFL for tasks assessed/performed                    Past Medical History:  Diagnosis Date   Diabetes mellitus without complication (HCC)    Hypertension    Stroke [facility])    Past Surgical History:  Procedure Laterality Date   ABDOMINAL HYSTERECTOMY     COLONOSCOPY N/A 10/06/2019   Procedure: COLONOSCOPY;  Surgeon: Toledo, Boykin Nearing, MD;  Location: [facility] ENDOSCOPY;  Service: Gastroenterology;  Laterality: N/A;   Patient Active Problem List   Diagnosis Date Noted   Hypertension    Diabetes mellitus without complication (HCC)    Stroke (HCC)    GIB (gastrointestinal bleeding)    AKI (acute kidney injury) (HCC)    UTI (urinary tract infection)     ONSET DATE: 2020  REFERRING DIAG: 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   THERAPY DIAG:  Muscle weakness (generalized)  Other lack of coordination  Chronic pain of right knee  Unsteadiness on feet  Difficulty in walking, not elsewhere classified  Rationale for Evaluation and Treatment: Rehabilitation  SUBJECTIVE:                                                                                                                                                                                              SUBJECTIVE STATEMENT: Pt doing well today. No medical updates, no MD appointments. Pt reports she feels to be making progress with PT so far.   Pt accompanied by: No one  PERTINENT HISTORY:   Patient presents for 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 Stroke was four years ago, has had multiple strokes. Had a little bit of therapy, afterwards in [place]. Moved up to [place] about  three years ago. [name] includes DM with diabetic neuropathy, stroke, HTN, AKI, UTI. Uses a walker to walk to the bathroom (two wheel and three wheel walkers).  Has a power chair for out of the house.   PAIN:  Are you having pain? Yes right hand 4/10, right knee 7/10    PRECAUTIONS: Fall  WEIGHT BEARING RESTRICTIONS: No  FALLS: Has patient fallen in last 6 months? No  PATIENT GOALS: get up and walk by herself, wants to walk with a cane.   Objective:   TODAY'S TREATMENT:                                                                                                                              DATE: 03/17/23  -MinA STS xfer from powerchair to RW, 27ft AMB minGuard assist to nustep, stand to sit minA to [employer]  -Assist with adjustment and mounting nustep; seat 7, arms 9, modA of Left arm to get to handle, Left foot ratchet strapped to pedal which is put into additional plantarflexion angle to accommodate ROM loss, towel behind T spine so she can reach without having to sit unsupported   -AMB overground with RW, minA to rise from guest chair, 32ft step to gait at minGuardA: 4 bouts with 2-3 minute recovery between (sock used over Left forefoot to decrease foot drop sticking) (walking interval ~90 seconds)   -static stance balance with pelvis anterior support on // bar x 4 minutes, bimanual task drawing and sorting playing cards by suit   PATIENT EDUCATION: Education details: Pt educated throughout session about proper posture and technique  with exercises. Improved exercise technique, movement at target joints, use of target muscles after min to mod verbal, visual, tactile cues.   Person educated: Patient and daughter Dana Lucas Education method: Explanation, Demonstration, Tactile cues, and Verbal cues Education comprehension: verbalized understanding, returned demonstration, verbal cues required, tactile cues required, and needs further education  HOME EXERCISE PROGRAM: Access Code: [email or web address] URL: [email or web address] Date: 12/06/2022 Prepared by: Precious Bard  Exercises - Leg Extension  - 1 x daily - 7 x weekly - 2 sets - 10 reps - 5 hold - Seated March  - 1 x daily - 7 x weekly - 2 sets - 10 reps - 5 hold - Seated Hip Abduction  - 1 x daily - 7 x weekly - 2 sets - 10 reps - 5 hold - Seated Hip Adduction Isometrics with Ball  - 1 x daily - 7 x weekly - 2 sets - 10 reps - 5 hold - Seated Weight Shifting Without Arm Support  - 1 x daily - 7 x weekly - 2 sets - 10 reps - 5 hold   GOALS: Goals reviewed with patient? Yes  SHORT TERM GOALS: Target date: 01/03/2023    Patient will be independent in home exercise program to improve strength/mobility for better functional independence with ADLs. Baseline: 01/26/2023: pt has some difficulty with HEP Goal status:  IN PROGRESS    LONG TERM GOALS: Target date: 02/28/2023    Patient will increase FOTO score to equal to or greater than 53    to demonstrate statistically significant improvement in mobility and quality of life.  Baseline: 3/5: 46%; 01/26/23: 45% ; 03/01/23: 42%  Goal status: IN PROGRESS  2.  Patient (> 69 years old) will complete five times sit to stand test in < 15 seconds indicating an increased LE strength and improved balance. Baseline: 3/5: 38 seconds with SUE support; blocking of LLE; 01/26/23 18 seconds with pulling to stand; 03/01/23: 17 seconds pull to stand  Goal status: Partially MET  3.  Patient will increase 10 meter walk test to >1.56m/s as  to improve gait speed for better community ambulation and to reduce fall risk Baseline: 3/5: 1 min 43 seconds with RW and w/c follow; 01/26/2023: 2 min 6 seconds; 03/01/23: 1 min 11 sec, 0.14 m/s with WC follow and 2WW  Goal status: IN PROGRESS  4.  Patient will increase BLE gross strength to 4/5 as to improve functional strength for independent gait, increased standing tolerance and increased ADL ability. Baseline: 3/5: grossly 2/5 LLE; 4//25/24: RLE grossly 4-/5, LLE grossly 2/5; 02/28/22 RLE grossly 4+/5 and LLE grossly 3+/5, exception 0/5 PF/DF (overall gross Bilat LE strength around 4-/5) Goal status: PARTIALLY MET    ASSESSMENT:  CLINICAL IMPRESSION:     OBJECTIVE IMPAIRMENTS: Abnormal gait, cardiopulmonary status limiting activity, decreased activity tolerance, decreased balance, decreased coordination, decreased endurance, decreased knowledge of use of DME, decreased mobility, difficulty walking, decreased ROM, decreased strength, impaired perceived functional ability, impaired flexibility, impaired UE functional use, improper body mechanics, postural dysfunction, obesity, and pain.   ACTIVITY LIMITATIONS: carrying, lifting, bending, sitting, standing, squatting, sleeping, stairs, transfers, bed mobility, bathing, toileting, dressing, reach over head, and caring for others  PARTICIPATION LIMITATIONS: meal prep, cleaning, laundry, interpersonal relationship, driving, shopping, community activity, and church  PERSONAL FACTORS: Age, Behavior pattern, Education, Fitness, Past/current experiences, Profession, Sex, Time since onset of injury/illness/exacerbation, and 3+ comorbidities: DM with diabetic neuropathy, stroke, HTN, AKI, UTI  are also affecting patient's functional outcome.   REHAB POTENTIAL: Fair length of time since CVA  CLINICAL DECISION MAKING: Evolving/moderate complexity  EVALUATION COMPLEXITY: Moderate  PLAN:  PT FREQUENCY: 2x/week  PT DURATION: 12 weeks  PLANNED  INTERVENTIONS: Therapeutic exercises, Therapeutic activity, Neuromuscular re-education, Balance training, Gait training, Patient/Family education, Self Care, Joint mobilization, Stair training, Vestibular training, Canalith repositioning, Visual/preceptual remediation/compensation, Orthotic/Fit training, DME instructions, Cognitive remediation, Electrical stimulation, Wheelchair mobility training, Spinal mobilization, Cryotherapy, Moist heat, Splintting, Taping, Traction, Ultrasound, Manual therapy, and Re-evaluation  PLAN FOR NEXT SESSION: stand in // bars, work on walking, strengthening LLE, STS  Amariyon Maynes C, PT 03/17/2023, 8:14 AM   8:14 AM, 03/17/23    Physical Therapist - [facility]  [phone] [facility])

## 2023-03-21 ENCOUNTER — Ambulatory Visit: Payer: 59 | Admitting: Occupational Therapy

## 2023-03-21 ENCOUNTER — Ambulatory Visit: Payer: 59

## 2023-03-21 DIAGNOSIS — M6281 Muscle weakness (generalized): Secondary | ICD-10-CM | POA: Diagnosis not present

## 2023-03-21 DIAGNOSIS — R278 Other lack of coordination: Secondary | ICD-10-CM

## 2023-03-21 DIAGNOSIS — R262 Difficulty in walking, not elsewhere classified: Secondary | ICD-10-CM

## 2023-03-21 DIAGNOSIS — R2681 Unsteadiness on feet: Secondary | ICD-10-CM

## 2023-03-21 NOTE — Therapy (Signed)
OUTPATIENT PHYSICAL THERAPY NEURO TREATMENT/Physical Therapy Progress Note   Dates of reporting period  01/26/2023   to   03/21/23      Patient Name: Dana Lucas MRN: [id number] DOB:1950/11/19, 72 y.o., female Today's Date: 03/21/2023   PCP: Bobbye Morton MD REFERRING PROVIDER: Louis Matte MD   END OF SESSION:  PT End of Session - 03/21/23 1021     Visit Number 20    Number of Visits 38    Date for PT Re-Evaluation 05/24/23    Authorization Type UHC Medicare; [place] Medicaid    Authorization Time Period 03/01/23-05/24/23    Progress Note Due on Visit 20    PT Start Time 1021    PT Stop Time 1057    PT Time Calculation (min) 36 min    Equipment Utilized During Treatment Gait belt    Activity Tolerance Patient tolerated treatment well;No increased pain    Behavior During Therapy WFL for tasks assessed/performed                    Past Medical History:  Diagnosis Date   Diabetes mellitus without complication (HCC)    Hypertension    Stroke [facility])    Past Surgical History:  Procedure Laterality Date   ABDOMINAL HYSTERECTOMY     COLONOSCOPY N/A 10/06/2019   Procedure: COLONOSCOPY;  Surgeon: Toledo, Boykin Nearing, MD;  Location: [facility] ENDOSCOPY;  Service: Gastroenterology;  Laterality: N/A;   Patient Active Problem List   Diagnosis Date Noted   Hypertension    Diabetes mellitus without complication (HCC)    Stroke (HCC)    GIB (gastrointestinal bleeding)    AKI (acute kidney injury) (HCC)    UTI (urinary tract infection)     ONSET DATE: 2020  REFERRING DIAG: 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   THERAPY DIAG:  Difficulty in walking, not elsewhere classified  Other lack of coordination  Muscle weakness (generalized)  Unsteadiness on feet  Rationale for Evaluation and Treatment: Rehabilitation  SUBJECTIVE:                                                                                                                                                                                              SUBJECTIVE STATEMENT: Pt has upcoming appt for AFO. Pt reports no pain currently, no falls/no stumbles. Pt reports no other updates.  Pt accompanied by: No one  PERTINENT HISTORY:   Patient presents for 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 Stroke was four years ago, has had multiple strokes. Had a little bit of therapy, afterwards  in [place]. Moved up to [place] about three years ago. [name] includes DM with diabetic neuropathy, stroke, HTN, AKI, UTI. Uses a walker to walk to the bathroom (two wheel and three wheel walkers).  Has a power chair for out of the house.   PAIN:  Are you having pain? Yes right hand 4/10, right knee 7/10    PRECAUTIONS: Fall  WEIGHT BEARING RESTRICTIONS: No  FALLS: Has patient fallen in last 6 months? No  PATIENT GOALS: get up and walk by herself, wants to walk with a cane.   Objective:   TODAY'S TREATMENT:                                                                                                                              DATE: 03/21/23  TA: Goal reassessment completed for progress visit. Please refer to goal section below for details.   TE: STS 5x WC hamstring walks 2x10 meters. Very challenging  STS 1x1, 1x5 push to stand   PATIENT EDUCATION: Education details: Pt educated throughout session about proper posture and technique with exercises. Improved exercise technique, movement at target joints, use of target muscles after min to mod verbal, visual, tactile cues. Goals  Person educated: Patient and   Education method: Explanation, Demonstration, Tactile cues, and Verbal cues Education comprehension: verbalized understanding, returned demonstration, verbal cues required, tactile cues required, and needs further education  HOME EXERCISE PROGRAM: Access Code: [email or web address] URL: [email or web address] Date:  12/06/2022 Prepared by: Precious Bard  Exercises - Leg Extension  - 1 x daily - 7 x weekly - 2 sets - 10 reps - 5 hold - Seated March  - 1 x daily - 7 x weekly - 2 sets - 10 reps - 5 hold - Seated Hip Abduction  - 1 x daily - 7 x weekly - 2 sets - 10 reps - 5 hold - Seated Hip Adduction Isometrics with Ball  - 1 x daily - 7 x weekly - 2 sets - 10 reps - 5 hold - Seated Weight Shifting Without Arm Support  - 1 x daily - 7 x weekly - 2 sets - 10 reps - 5 hold   GOALS: Goals reviewed with patient? Yes  SHORT TERM GOALS: Target date: 05/02/2023      Patient will be independent in home exercise program to improve strength/mobility for better functional independence with ADLs. Baseline: 01/26/2023: pt has some difficulty with HEP; 03/21/23: Pt reports doing HEP every day Goal status: MET    LONG TERM GOALS: Target date: 06/13/2023   Patient will increase FOTO score to equal to or greater than 53    to demonstrate statistically significant improvement in mobility and quality of life.  Baseline: 3/5: 46%; 01/26/23: 45% ; 03/01/23: 42%; 6/18: 52 Goal status: Partially MET  2.  Patient (> 71 years old) will complete five times sit to stand test in < 15 seconds indicating an increased LE strength and improved balance.  Baseline: 3/5: 38 seconds with SUE support; blocking of LLE; 01/26/23 18 seconds with pulling to stand; 03/01/23: 17 seconds pull to stand; 6/18: 14 seconds push to stand Goal status: Partially MET  3.  Patient will increase 10 meter walk test to >1.75m/s as to improve gait speed for better community ambulation and to reduce fall risk Baseline: 3/5: 1 min 43 seconds with RW and w/c follow; 01/26/2023: 2 min 6 seconds; 03/01/23: 1 min 11 sec, 0.14 m/s with WC follow and 2WW; 03/20/22: 0.14 m/s  Goal status: IN PROGRESS  4.  Patient will increase BLE gross strength to 4/5 as to improve functional strength for independent gait, increased standing tolerance and increased ADL  ability. Baseline: 3/5: grossly 2/5 LLE; 4//25/24: RLE grossly 4-/5, LLE grossly 2/5; 02/28/22 RLE grossly 4+/5 and LLE grossly 3+/5, exception 0/5 PF/DF (overall gross Bilat LE strength around 4-/5); 6/18: Gross bilat LE strength is 4+/5, exception L ankle, gross LLE is 4/5 Goal status: MET    ASSESSMENT:  CLINICAL IMPRESSION: Pt has met MMT goal and partially met 5STS goal and FOTO goal, indicating increased LE strength and slight decrease in fall risk as well as increase in functional mobility and QOL. While pt making gains, pt with same performance today on as previous goal assessment. Plan to continue to address gait deficit. Patient's condition has the potential to improve in response to therapy. Maximum improvement is yet to be obtained. The anticipated improvement is attainable and reasonable in a generally predictable time.  The pt will benefit from further skilled PT to address impairments and meet therapy goals and increase mobility, QOL.      OBJECTIVE IMPAIRMENTS: Abnormal gait, cardiopulmonary status limiting activity, decreased activity tolerance, decreased balance, decreased coordination, decreased endurance, decreased knowledge of use of DME, decreased mobility, difficulty walking, decreased ROM, decreased strength, impaired perceived functional ability, impaired flexibility, impaired UE functional use, improper body mechanics, postural dysfunction, obesity, and pain.   ACTIVITY LIMITATIONS: carrying, lifting, bending, sitting, standing, squatting, sleeping, stairs, transfers, bed mobility, bathing, toileting, dressing, reach over head, and caring for others  PARTICIPATION LIMITATIONS: meal prep, cleaning, laundry, interpersonal relationship, driving, shopping, community activity, and church  PERSONAL FACTORS: Age, Behavior pattern, Education, Fitness, Past/current experiences, Profession, Sex, Time since onset of injury/illness/exacerbation, and 3+ comorbidities: DM with  diabetic neuropathy, stroke, HTN, AKI, UTI  are also affecting patient's functional outcome.   REHAB POTENTIAL: Fair length of time since CVA  CLINICAL DECISION MAKING: Evolving/moderate complexity  EVALUATION COMPLEXITY: Moderate  PLAN:  PT FREQUENCY: 2x/week  PT DURATION: 12 weeks  PLANNED INTERVENTIONS: Therapeutic exercises, Therapeutic activity, Neuromuscular re-education, Balance training, Gait training, Patient/Family education, Self Care, Joint mobilization, Stair training, Vestibular training, Canalith repositioning, Visual/preceptual remediation/compensation, Orthotic/Fit training, DME instructions, Cognitive remediation, Electrical stimulation, Wheelchair mobility training, Spinal mobilization, Cryotherapy, Moist heat, Splintting, Taping, Traction, Ultrasound, Manual therapy, and Re-evaluation  PLAN FOR NEXT SESSION: stand in // bars, work on walking, strengthening LLE, STS   03/21/2023, 10:58 AM   10:58 AM, 03/21/23 Temple Pacini PT, DPT    Physical Therapist - [facility]  [phone] [facility])

## 2023-03-23 ENCOUNTER — Encounter: Payer: 59 | Admitting: Occupational Therapy

## 2023-03-24 ENCOUNTER — Ambulatory Visit: Payer: 59

## 2023-03-24 ENCOUNTER — Encounter: Payer: Self-pay | Admitting: Physical Therapy

## 2023-03-24 DIAGNOSIS — R278 Other lack of coordination: Secondary | ICD-10-CM

## 2023-03-24 DIAGNOSIS — M6281 Muscle weakness (generalized): Secondary | ICD-10-CM

## 2023-03-24 DIAGNOSIS — R262 Difficulty in walking, not elsewhere classified: Secondary | ICD-10-CM

## 2023-03-24 DIAGNOSIS — R2681 Unsteadiness on feet: Secondary | ICD-10-CM

## 2023-03-24 NOTE — Therapy (Signed)
OUTPATIENT PHYSICAL THERAPY NEURO TREATMENT NOTE    Patient Name: Dana Lucas MRN: [id number] DOB:10/08/1950, 72 y.o., female Today's Date: 03/24/2023   PCP: Dana Morton MD REFERRING PROVIDER: Louis Matte MD   END OF SESSION:  PT End of Session - 03/24/23 0924     Visit Number 21    Number of Visits 38    Date for PT Re-Evaluation 05/24/23    Authorization Type UHC Medicare; [place] Medicaid    Authorization Time Period 03/01/23-05/24/23    Progress Note Due on Visit 30    PT Start Time 0925    PT Stop Time 1013    PT Time Calculation (min) 48 min    Equipment Utilized During Treatment Gait belt    Activity Tolerance Patient tolerated treatment well;No increased pain    Behavior During Therapy WFL for tasks assessed/performed                     Past Medical History:  Diagnosis Date   Diabetes mellitus without complication (HCC)    Hypertension    Stroke [name])    Past Surgical History:  Procedure Laterality Date   ABDOMINAL HYSTERECTOMY     COLONOSCOPY N/A 10/06/2019   Procedure: COLONOSCOPY;  Surgeon: Toledo, Boykin Nearing, MD;  Location: [facility] ENDOSCOPY;  Service: Gastroenterology;  Laterality: N/A;   Patient Active Problem List   Diagnosis Date Noted   Hypertension    Diabetes mellitus without complication (HCC)    Stroke (HCC)    GIB (gastrointestinal bleeding)    AKI (acute kidney injury) (HCC)    UTI (urinary tract infection)     ONSET DATE: 2020  REFERRING DIAG: 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   THERAPY DIAG:  Difficulty in walking, not elsewhere classified  Other lack of coordination  Muscle weakness (generalized)  Unsteadiness on feet  Rationale for Evaluation and Treatment: Rehabilitation  SUBJECTIVE:                                                                                                                                                                                              SUBJECTIVE STATEMENT: Patient reports having appointment with orthotist on 6/27 for AFO. Denies falls or other complaints this session.   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 Stroke was four years ago, has had multiple strokes. Had a little bit of therapy, afterwards in [place]. Moved up to [place] about three years ago. [name] includes DM with diabetic neuropathy, stroke, HTN,  AKI, UTI. Uses a [name] to walk to the bathroom (two wheel and three wheel walkers).  Has a power chair for out of the house.   PAIN:  Are you having pain? Yes Right hand    PRECAUTIONS: Fall  WEIGHT BEARING RESTRICTIONS: No  FALLS: Has patient fallen in last 6 months? No  LIVING ENVIRONMENT: Lives with: lives with their family Lives in: House/apartment Stairs:  ramp outside Has following equipment at home: [name] - 2 wheeled, Environmental consultant - 4 wheeled, Wheelchair (power), shower chair, Ramped entry, and chair lift and hospital bed   PLOF: Independent  PATIENT GOALS: get up and walk by herself, wants to walk with a cane.   OBJECTIVE:   DIAGNOSTIC FINDINGS: CVA in [place]; no imaging in system  COGNITION: Overall cognitive status:  a little bit per granddaughter.    SENSATION: WFL  COORDINATION: Limited LLE     POSTURE: rounded shoulders, forward head, flexed trunk , and weight shift right    LOWER EXTREMITY MMT:    MMT Right Eval Left Eval  Hip flexion 4 2  Hip extension    Hip abduction 4 2+  Hip adduction 4 2+  Knee flexion 4 2-  Knee extension 4 2+  Ankle dorsiflexion 3   Ankle plantarflexion 3   (Blank rows = not tested)   TRANSFERS: Assistive device utilized: Environmental consultant - 2 wheeled  Sit to stand: CGA and Min A; blocking of L foot  Stand to sit: CGA Chair to chair: Min A    GAIT: Gait pattern: step to pattern, decreased stance time- Left, decreased ankle dorsiflexion-  Left, and poor foot clearance- Left Distance walked: 32ft Assistive device utilized: Environmental consultant - 2 wheeled Level of assistance: CGA Comments: L knee hyperextension with weightbearing  FUNCTIONAL TESTS:  5 times sit to stand: 38 seconds with SUE support  10 meter walk test: 1 min 43 seconds with RW  PATIENT SURVEYS:  FOTO 46  TODAY'S TREATMENT:                                                                                                                              DATE: 03/24/23  TE: Ambulation 2 x 67' with RW, close CGA - demonstrates L foot drag requiring cues to increase step height - seated rest break between bouts  LAQ 3 x 10 with 2# AW Seated marching 2 x 10 each LE with 2# AW, hard Seated hip adduction ball squeeze 3 x 10    In // bars, fwd/bwd/lateral walking x 3 laps each 2# AW with UE support, cues for upright posture, hard    PATIENT EDUCATION: Education details: Pt educated throughout session about proper posture and technique with exercises. Improved exercise technique, movement at target joints, use of target muscles after min to mod verbal, visual, tactile cues.   Person educated: Patient and daughter [name] Education method: Explanation, Demonstration, Tactile cues, and Verbal cues Education comprehension: verbalized understanding, returned demonstration, verbal  cues required, tactile cues required, and needs further education  HOME EXERCISE PROGRAM: Access Code: [email or web address] URL: [email or web address] Date: 12/06/2022 Prepared by: Precious Bard  Exercises - Leg Extension  - 1 x daily - 7 x weekly - 2 sets - 10 reps - 5 hold - Seated March  - 1 x daily - 7 x weekly - 2 sets - 10 reps - 5 hold - Seated Hip Abduction  - 1 x daily - 7 x weekly - 2 sets - 10 reps - 5 hold - Seated Hip Adduction Isometrics with Ball  - 1 x daily - 7 x weekly - 2 sets - 10 reps - 5 hold - Seated Weight Shifting Without Arm Support  - 1 x daily - 7 x weekly - 2 sets - 10  reps - 5 hold   GOALS: Goals reviewed with patient? Yes  SHORT TERM GOALS: Target date: 01/03/2023    Patient will be independent in home exercise program to improve strength/mobility for better functional independence with ADLs. Baseline: 01/26/2023: pt has some difficulty with HEP Goal status: IN PROGRESS    LONG TERM GOALS: Target date: 02/28/2023    Patient will increase FOTO score to equal to or greater than 53    to demonstrate statistically significant improvement in mobility and quality of life.  Baseline: 3/5: 46%; 01/26/23: 45% ; 03/01/23: 42%  Goal status: IN PROGRESS  2.  Patient (> 37 years old) will complete five times sit to stand test in < 15 seconds indicating an increased LE strength and improved balance. Baseline: 3/5: 38 seconds with SUE support; blocking of LLE; 01/26/23 18 seconds with pulling to stand; 03/01/23: 17 seconds pull to stand  Goal status: Partially MET  3.  Patient will increase 10 meter walk test to >1.21m/s as to improve gait speed for better community ambulation and to reduce fall risk Baseline: 3/5: 1 min 43 seconds with RW and w/c follow; 01/26/2023: 2 min 6 seconds; 03/01/23: 1 min 11 sec, 0.14 m/s with WC follow and 2WW  Goal status: IN PROGRESS  4.  Patient will increase BLE gross strength to 4/5 as to improve functional strength for independent gait, increased standing tolerance and increased ADL ability. Baseline: 3/5: grossly 2/5 LLE; 4//25/24: RLE grossly 4-/5, LLE grossly 2/5; 02/28/22 RLE grossly 4+/5 and LLE grossly 3+/5, exception 0/5 PF/DF (overall gross Bilat LE strength around 4-/5) Goal status: PARTIALLY MET    ASSESSMENT:  CLINICAL IMPRESSION:  Patient able to increase ambulation distance this session to 2' with RW and CGA. Continues to await AFO appointment which is scheduled for 6/27. Session focused on LE strengthening with use of ankle weights to increase resistance. Tolerated resistance well but reports hip flexion and  fwd/bwd/lateral walking difficult with resistance. Patient will continue to benefit from skilled therapy to address remaining deficits in order to improve quality of life and return to PLOF.     OBJECTIVE IMPAIRMENTS: Abnormal gait, cardiopulmonary status limiting activity, decreased activity tolerance, decreased balance, decreased coordination, decreased endurance, decreased knowledge of use of DME, decreased mobility, difficulty walking, decreased ROM, decreased strength, impaired perceived functional ability, impaired flexibility, impaired UE functional use, improper body mechanics, postural dysfunction, obesity, and pain.   ACTIVITY LIMITATIONS: carrying, lifting, bending, sitting, standing, squatting, sleeping, stairs, transfers, bed mobility, bathing, toileting, dressing, reach over head, and caring for others  PARTICIPATION LIMITATIONS: meal prep, cleaning, laundry, interpersonal relationship, driving, shopping, community activity, and church  PERSONAL FACTORS: Age, Behavior pattern, Education, Fitness, Past/current experiences,  Profession, Sex, Time since onset of injury/illness/exacerbation, and 3+ comorbidities: DM with diabetic neuropathy, stroke, HTN, AKI, UTI  are also affecting patient's functional outcome.   REHAB POTENTIAL: Fair length of time since CVA  CLINICAL DECISION MAKING: Evolving/moderate complexity  EVALUATION COMPLEXITY: Moderate  PLAN:  PT FREQUENCY: 2x/week  PT DURATION: 12 weeks  PLANNED INTERVENTIONS: Therapeutic exercises, Therapeutic activity, Neuromuscular re-education, Balance training, Gait training, Patient/Family education, Self Care, Joint mobilization, Stair training, Vestibular training, Canalith repositioning, Visual/preceptual remediation/compensation, Orthotic/Fit training, DME instructions, Cognitive remediation, Electrical stimulation, Wheelchair mobility training, Spinal mobilization, Cryotherapy, Moist heat, Splintting, Taping, Traction,  Ultrasound, Manual therapy, and Re-evaluation  PLAN FOR NEXT SESSION: stand in // bars, work on walking, strengthening LLE, STS  Viviann Spare, PT, DPT 03/24/2023, 10:23 AM   10:23 AM, 03/24/23  Physical Therapist - [facility]  [phone] [facility])

## 2023-03-28 ENCOUNTER — Ambulatory Visit: Payer: 59 | Admitting: Occupational Therapy

## 2023-03-28 ENCOUNTER — Ambulatory Visit: Payer: 59

## 2023-03-28 DIAGNOSIS — R2681 Unsteadiness on feet: Secondary | ICD-10-CM

## 2023-03-28 DIAGNOSIS — R262 Difficulty in walking, not elsewhere classified: Secondary | ICD-10-CM

## 2023-03-28 DIAGNOSIS — M6281 Muscle weakness (generalized): Secondary | ICD-10-CM

## 2023-03-28 DIAGNOSIS — R278 Other lack of coordination: Secondary | ICD-10-CM

## 2023-03-28 NOTE — Therapy (Addendum)
OUTPATIENT PHYSICAL THERAPY NEURO TREATMENT NOTE    Patient Name: Dana Lucas MRN: [id number] DOB:1951-09-27, 72 y.o., female Today's Date: 03/28/2023   PCP: Bobbye Morton MD REFERRING PROVIDER: Louis Matte MD   END OF SESSION:  PT End of Session - 03/28/23 0934     Visit Number 22    Number of Visits 38    Date for PT Re-Evaluation 05/24/23    Authorization Type [employer] Medicare; [place] Medicaid    Authorization Time Period 03/01/23-05/24/23    Progress Note Due on Visit 30    PT Start Time 0933    PT Stop Time 1015    PT Time Calculation (min) 42 min    Equipment Utilized During Treatment Gait belt    Activity Tolerance Patient tolerated treatment well;No increased pain    Behavior During Therapy WFL for tasks assessed/performed                     Past Medical History:  Diagnosis Date   Diabetes mellitus without complication (HCC)    Hypertension    Stroke [facility])    Past Surgical History:  Procedure Laterality Date   ABDOMINAL HYSTERECTOMY     COLONOSCOPY N/A 10/06/2019   Procedure: COLONOSCOPY;  Surgeon: Toledo, Boykin Nearing, MD;  Location: [facility] ENDOSCOPY;  Service: Gastroenterology;  Laterality: N/A;   Patient Active Problem List   Diagnosis Date Noted   Hypertension    Diabetes mellitus without complication (HCC)    Stroke (HCC)    GIB (gastrointestinal bleeding)    AKI (acute kidney injury) (HCC)    UTI (urinary tract infection)     ONSET DATE: 2020  REFERRING DIAG: 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   THERAPY DIAG:  Difficulty in walking, not elsewhere classified  Other lack of coordination  Muscle weakness (generalized)  Unsteadiness on feet  Rationale for Evaluation and Treatment: Rehabilitation  SUBJECTIVE:                                                                                                                                                                                              SUBJECTIVE STATEMENT: Pt reports she is excited and ready to get her AFO on Thursday.  Pt denies any falls since the last visit.  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 Stroke was four years ago, has had multiple strokes. Had a little bit of therapy, afterwards in [place]. Moved up to [place] about three years ago. [name] includes DM with  diabetic neuropathy, stroke, HTN, AKI, UTI. Uses a walker to walk to the bathroom (two wheel and three wheel walkers).  Has a power chair for out of the house.   PAIN:  Are you having pain? Yes Right hand    PRECAUTIONS: Fall  WEIGHT BEARING RESTRICTIONS: No  FALLS: Has patient fallen in last 6 months? No  LIVING ENVIRONMENT: Lives with: lives with their family Lives in: House/apartment Stairs:  ramp outside Has following equipment at home: [name] - 2 wheeled, Environmental consultant - 4 wheeled, Wheelchair (power), shower chair, Ramped entry, and chair lift and hospital bed   PLOF: Independent  PATIENT GOALS: get up and walk by herself, wants to walk with a cane.   OBJECTIVE:   DIAGNOSTIC FINDINGS: CVA in [place]; no imaging in system  COGNITION: Overall cognitive status:  a little bit per granddaughter.    SENSATION: WFL  COORDINATION: Limited LLE     POSTURE: rounded shoulders, forward head, flexed trunk , and weight shift right    LOWER EXTREMITY MMT:    MMT Right Eval Left Eval  Hip flexion 4 2  Hip extension    Hip abduction 4 2+  Hip adduction 4 2+  Knee flexion 4 2-  Knee extension 4 2+  Ankle dorsiflexion 3   Ankle plantarflexion 3   (Blank rows = not tested)   TRANSFERS: Assistive device utilized: Environmental consultant - 2 wheeled  Sit to stand: CGA and Min A; blocking of L foot  Stand to sit: CGA Chair to chair: Min A    GAIT: Gait pattern: step to pattern, decreased stance time- Left, decreased ankle dorsiflexion- Left, and  poor foot clearance- Left Distance walked: 27ft Assistive device utilized: Environmental consultant - 2 wheeled Level of assistance: CGA Comments: L knee hyperextension with weightbearing  FUNCTIONAL TESTS:  5 times sit to stand: 38 seconds with SUE support  10 meter walk test: 1 min 43 seconds with RW  PATIENT SURVEYS:  FOTO 46  TODAY'S TREATMENT: DATE: 03/28/23   TE:  Ambulation 3' with RW, close CGA - demonstrates L foot drag requiring cues to increase step height - seated rest break between bouts  Seated LAQ, 2.5# AW donned, 2x10 each LE, eccentric lowering with therapist assist on the L LE Seated marching 2 x 10 each LE with 2.5# AW, eccentric lowering with therapist assist on the L LE Seated hip adduction ball squeeze 3x10  Seated heel/toe raises with 2.5# AW donned, 2x10  Ambulation 200' with RW, close CGA - pt with good carryover to lift L LE in order to clear the foot   In // bars, fwd/bwd/lateral walking x 3 laps each 2# AW with UE support, cues for upright posture, hard    PATIENT EDUCATION: Education details: Pt educated throughout session about proper posture and technique with exercises. Improved exercise technique, movement at target joints, use of target muscles after min to mod verbal, visual, tactile cues.   Person educated: Patient and daughter [name] Education method: Explanation, Demonstration, Tactile cues, and Verbal cues Education comprehension: verbalized understanding, returned demonstration, verbal cues required, tactile cues required, and needs further education  HOME EXERCISE PROGRAM: Access Code: [email or web address] URL: [email or web address] Date: 12/06/2022 Prepared by: Precious Bard  Exercises - Leg Extension  - 1 x daily - 7 x weekly - 2 sets - 10 reps - 5 hold - Seated March  - 1 x daily - 7 x weekly - 2 sets - 10 reps - 5 hold - Seated Hip Abduction  - 1  x daily - 7 x weekly - 2 sets - 10 reps - 5 hold - Seated Hip Adduction Isometrics with Ball   - 1 x daily - 7 x weekly - 2 sets - 10 reps - 5 hold - Seated Weight Shifting Without Arm Support  - 1 x daily - 7 x weekly - 2 sets - 10 reps - 5 hold   GOALS: Goals reviewed with patient? Yes  SHORT TERM GOALS: Target date: 01/03/2023    Patient will be independent in home exercise program to improve strength/mobility for better functional independence with ADLs. Baseline: 01/26/2023: pt has some difficulty with HEP Goal status: IN PROGRESS    LONG TERM GOALS: Target date: 02/28/2023    Patient will increase FOTO score to equal to or greater than 53    to demonstrate statistically significant improvement in mobility and quality of life.  Baseline: 3/5: 46%; 01/26/23: 45% ; 03/01/23: 42%  Goal status: IN PROGRESS  2.  Patient (> 52 years old) will complete five times sit to stand test in < 15 seconds indicating an increased LE strength and improved balance. Baseline: 3/5: 38 seconds with SUE support; blocking of LLE; 01/26/23 18 seconds with pulling to stand; 03/01/23: 17 seconds pull to stand  Goal status: Partially MET  3.  Patient will increase 10 meter walk test to >1.79m/s as to improve gait speed for better community ambulation and to reduce fall risk Baseline: 3/5: 1 min 43 seconds with RW and w/c follow; 01/26/2023: 2 min 6 seconds; 03/01/23: 1 min 11 sec, 0.14 m/s with WC follow and 2WW  Goal status: IN PROGRESS  4.  Patient will increase BLE gross strength to 4/5 as to improve functional strength for independent gait, increased standing tolerance and increased ADL ability. Baseline: 3/5: grossly 2/5 LLE; 4//25/24: RLE grossly 4-/5, LLE grossly 2/5; 02/28/22 RLE grossly 4+/5 and LLE grossly 3+/5, exception 0/5 PF/DF (overall gross Bilat LE strength around 4-/5) Goal status: PARTIALLY MET    ASSESSMENT:  CLINICAL IMPRESSION:   Pt put forth great effort throughout the session.  Pt able to ambulate with increased distance during session and also able to tolerate increased  therapeutic exercises with resistance.  Pt notes the ambulation to be difficult, however pt was able to perform without any adverse effects.  Pt continues to benefit from increased ambulation and will be given increasingly more difficult exercises in order to improve the overall function and mobility of the pt.  Pt will continue to benefit from skilled therapy to address remaining deficits in order to improve overall QoL and return to PLOF.       OBJECTIVE IMPAIRMENTS: Abnormal gait, cardiopulmonary status limiting activity, decreased activity tolerance, decreased balance, decreased coordination, decreased endurance, decreased knowledge of use of DME, decreased mobility, difficulty walking, decreased ROM, decreased strength, impaired perceived functional ability, impaired flexibility, impaired UE functional use, improper body mechanics, postural dysfunction, obesity, and pain.   ACTIVITY LIMITATIONS: carrying, lifting, bending, sitting, standing, squatting, sleeping, stairs, transfers, bed mobility, bathing, toileting, dressing, reach over head, and caring for others  PARTICIPATION LIMITATIONS: meal prep, cleaning, laundry, interpersonal relationship, driving, shopping, community activity, and church  PERSONAL FACTORS: Age, Behavior pattern, Education, Fitness, Past/current experiences, Profession, Sex, Time since onset of injury/illness/exacerbation, and 3+ comorbidities: DM with diabetic neuropathy, stroke, HTN, AKI, UTI  are also affecting patient's functional outcome.   REHAB POTENTIAL: Fair length of time since CVA  CLINICAL DECISION MAKING: Evolving/moderate complexity  EVALUATION COMPLEXITY: Moderate  PLAN:  PT FREQUENCY: 2x/week  PT DURATION: 12 weeks  PLANNED INTERVENTIONS: Therapeutic exercises, Therapeutic activity, Neuromuscular re-education, Balance training, Gait training, Patient/Family education, Self Care, Joint mobilization, Stair training, Vestibular training, Canalith  repositioning, Visual/preceptual remediation/compensation, Orthotic/Fit training, DME instructions, Cognitive remediation, Electrical stimulation, Wheelchair mobility training, Spinal mobilization, Cryotherapy, Moist heat, Splintting, Taping, Traction, Ultrasound, Manual therapy, and Re-evaluation  PLAN FOR NEXT SESSION: stand in // bars, work on walking, strengthening LLE, STS   Nolon Bussing, PT, DPT Physical Therapist - [facility]  [facility]  03/28/23, 2:45 PM

## 2023-03-28 NOTE — Therapy (Addendum)
Occupational Therapy Progress Note  Dates of reporting period  02/02/2023   to   03/28/2023   Patient Name: Dana Lucas MRN: [id number] DOB:02-Jun-1951, 72 y.o., female  PCP: Hinda Lenis, MD REFERRING PROVIDER: Sumner Boast, MD  END OF SESSION:  OT End of Session - 03/28/23 1200     Visit Number 20    Number of Visits 24    Date for OT Re-Evaluation 04/21/23    OT Start Time 0845    OT Stop Time 0930    OT Time Calculation (min) 45 min    Equipment Utilized During Treatment power wc    Activity Tolerance Patient tolerated treatment well    Behavior During Therapy WFL for tasks assessed/performed             Past Medical History:  Diagnosis Date   Diabetes mellitus without complication (HCC)    Hypertension    Stroke [facility])    Past Surgical History:  Procedure Laterality Date   ABDOMINAL HYSTERECTOMY     COLONOSCOPY N/A 10/06/2019   Procedure: COLONOSCOPY;  Surgeon: Toledo, Boykin Nearing, MD;  Location: [facility] ENDOSCOPY;  Service: Gastroenterology;  Laterality: N/A;   Patient Active Problem List   Diagnosis Date Noted   Hypertension    Diabetes mellitus without complication (HCC)    Stroke (HCC)    GIB (gastrointestinal bleeding)    AKI (acute kidney injury) (HCC)    UTI (urinary tract infection)    ONSET DATE: 05/2014  REFERRING DIAG: CVA  THERAPY DIAG:  Muscle weakness (generalized)  Other lack of coordination  Rationale for Evaluation and Treatment: Rehabilitation  SUBJECTIVE:  SUBJECTIVE STATEMENT: Pt. Reports that she is doing well today and that she has not utilized the bag for her walker yet. Pt accompanied by: family member , DIL [name])  PERTINENT HISTORY: Pt. is a 72 y.o. female who has a history of multiple CVAs beginning in 2015. Pt. 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   PRECAUTIONS: None  WEIGHT BEARING RESTRICTIONS: No  PAIN:  Are you having pain? Pt. Reported that she is not experiencing any  pain today. FALLS: Has patient fallen in last 6 months? No  LIVING ENVIRONMENT: Lives with: lives with their family and lives with their son Lives in: House/apartment Stairs: one story, ramped entrance Has following equipment at home: Wheelchair (power) and Wheelchair (manual), 2 wheeled walker, 3 wheeled walker, power lift, hospital bed, transfer shower bench  PLOF: Independent  PATIENT GOALS: To be able to walk again, and to drive  OBJECTIVE:   HAND DOMINANCE: Right  ADLs: Transfers/ambulation related to ADLs: Eating: Independent Grooming: Independent UB Dressing: Independent LB Dressing: Independent pants, MaxA shoes, and socks Toileting: Assist with transfers, reports independence with toileting care Bathing: Assist from DIL Tub Shower transfers: Min-ModA shower transfers Equipment: See above  IADLs: Shopping: DIL performs shopping Light housekeeping: Unable Meal Prep: Pt. Is able to retrieve a beverage, or snack, light meal prep Community mobility: Power w/c Medication management: Grand daughter assists with pillbox set-up.  Pt. Independently takes medication Financial management: No changes Handwriting: Name: 100% legible in printed form, 75% in cursive form  MOBILITY STATUS: Needs Assist: Uses power w/c  POSTURE COMMENTS:  Sitting balance: WFL supported  FUNCTIONAL OUTCOME MEASURES: FOTO: 33 TR:39 02/02/23: 44 03/10/23: 40 03/28/23: 44 TR: 39  UPPER EXTREMITY ROM:    Active ROM Right WFL Left eval Left 02/02/23 Left  03/10/23  Shoulder flexion  0(93) 0 (90) 0 (90)  Shoulder abduction  30(83) 35 (90)  40 (90)  Shoulder adduction      Shoulder extension      Shoulder internal rotation      Shoulder external rotation      Elbow flexion  0-87(0-122) 80   Elbow extension   -40   Wrist flexion  52 82   Wrist extension  10(50) 0 (58) 0 (70)  Wrist ulnar deviation      Wrist radial deviation      Wrist pronation      Wrist supination      (Blank rows = not  tested)  Full Digit flexion to the [facility] Active digit extension through 75% of the range grossly Active thumb radial, and palmar abduction  03/10/23: Active digit extension through full range grossly through digits 1-5  UPPER EXTREMITY MMT:     MMT Right eval Left eval Left 02/02/23 Left 03/10/23  Shoulder flexion  0/5 0 0  Shoulder abduction  2-/5 2- 2-  Shoulder adduction      Shoulder extension      Shoulder internal rotation      Shoulder external rotation      Middle trapezius      Lower trapezius      Elbow flexion  3/5 3- 3-  Elbow extension      Wrist flexion      Wrist extension  2/5 2 3-  Wrist ulnar deviation      Wrist radial deviation      Wrist pronation      Wrist supination      (Blank rows = not tested)  HAND FUNCTION: Grip strength: Right: 18 lbs; Left: 10 lbs, Lateral pinch: Right: 5 lbs, Left: 11 lbs, and 3 point pinch: Right: 1 lbs, Left: 15 lbs 02/02/23: Grip strength: Right: 8 lbs (limited by pain); Left: 25 lbs, Lateral pinch: Right: 5 lbs, Left: 12 lbs, and 3 point pinch: Right: 4 lbs, Left: 6 lbs 03/10/23: Grip strength: Right: 12 lbs (limited by pain); Left: 25 lbs, Lateral pinch: Right: 8 lbs, Left: 12 lbs, and 3 point pinch: Right: 7 lbs, Left: 5 lbs 03/28/23: Grip strength: Right: 19 lbs; Left: 25 lbs, Lateral pinch: Right: 8 lbs, Left: 11 lbs, and 3 point pinch: Right: 7 lbs, Left: 12 lbs  COORDINATION: Eval: N/A 02/02/23: 9 hole R: 42 sec; L: (unable) Able to pick up 1 peg and place in hole  03/10/23: R unable: Pt able to pick up 1 peg but unable to place in hole; pt can remove a peg from the hole  SENSATION: WFL  EDEMA: TBD  MUSCLE TONE: LUE: Hypotonic  COGNITION: Overall cognitive status: Within functional limits for tasks assessed  VISION: Subjective report:  Pt. Reports having an Eye appointment Baseline vision: Wears glasses all the time  VISION ASSESSMENT: To be further assessed in functional context  PRAXIS: Impaired: Motor  planning  TODAY'S TREATMENT:    Measurements were obtained and goals were reviewed  Self Care: Pt. Worked on using the 1 hand AE meal preparation utensils including the rocker knife and 1 handed cutting board with spikes and corner ridges. Pt. Worked on using the rocker knife to cut the yellow resistive putty in vertical and horizontal directions with the R hand. Pt. Used L hand to stabilize the cutting board.   Therapeutic Exercise:  Pt. Worked on using L hand to grasp jumbo pegs and place them onto the jumbo peg board. Pt. Worked on removing the jumbo pegs from the peg  board using the hand grip strengthener set to 6.6# for 1x trial.  Pt. Education details: 1 handed AE tools for meal prep Person educated: Patient, daughter-in-law Education method: explanation, vc, visual from internet Education comprehension: verbalized understanding, receptive to education   HOME EXERCISE PROGRAM: Towel squeezes grip strengthening, seated marches and knee extension (lifting legs over tub)  GOALS: Goals reviewed with patient? Yes  SHORT TERM GOALS: Target date: 03/31/23 (3 weeks)   Pt. Will demonstrate independence with HEPs for the LUE. Baseline: Eval: No current HEP; 02/02/23: Pt demos self passive stretching at the shoulder in a couple different planes (horiz add, flexion); pt has been encouraged to perform towel squeezes for grip strengthening, but pt reports she has not yet done this at home; 03/10/23: Pt verbalizes/demos understanding of table slides for LUE mobility and towel squeezes for L grip strengthening, though uncertain of frequency of participation at home. Goal status: achieved  LONG TERM GOALS: Target date: 04/21/23 (6 weeks)  Pt. Will increase FOTO score by 2 points for Pt. perceived improvement with assessment specific ADL/IADL improvement. Baseline: Eval: FOTO 33, TR score: 39; 02/02/23: FOTO 44; 03/10/23: 40 03/28/23: 44 Goal status: achieved/ongoing  2.  Pt. Will improve left shoulder  ROM by 10 degrees to assist with self-dressing. Baseline: Left shoulder flexion: 0(93), Abduction: 30(83); 02/02/23: L shoulder flexion 0 (90), abd 35 (90); 03/10/23: L shoulder flexion 0 (90), abd 40 (90) Goal status: Not met/discontinue  3.  Pt. Will improve left wrist extension by 10 in preparation for functional reaching Baseline: Left wrist extension: 10(50); 02/02/23: L wrist ext 0 (58); 03/10/23: L wrist ext active to neutral (passively to 70) Goal status: Not met/discontinue  4.  Pt. Will increase left grip strength to 30# or more in preparation for being able to securely stabilize ADL/IADL items at the tabletop. (revised from 5# on 02/02/23) Baseline: Left grip strength: 10#; 02/02/23: L 25#; 03/10/23: L grip 25# 6/25: L grip 25# Goal status: ongoing  5. Pt will perform tub transfer with leg lifter and transfer tub bench with supv/set up. Baseline: Min A with sit<>stand from tub bench, mod A to lift L leg over tub; 02/02/23: supv with sit<>stand from tub bench, min A to lift L leg over tub with use of leg lifter; 03/10/23: DIL helps to lift legs over tub if performing in the p.m. d/t fatigue end of day, in the a.m pt can use leg lifter to lift legs over tub with supv/set up while seated on transfer tub bench  Goal status: partially met; discontinue  6. Pt will perform UB bathing with min A.  Baseline: Mod A; 02/02/23: min A; 03/10/23: capable of performing with min A, but DIL typically does all the washing.  Encouraged pt to increase participation for maximizing overall strength, activity tolerance, and indep with ADLs.  Goal status: achieved  7. Pt will utilize walker tray/bag to be able to transport food/drink items from refrigerator to table top with modified indep.   Baseline:03/28/2023: Continue 03/10/23: Family manages food/drink set up; walker bag issued this date and educ on walker tray options.  Goal status: Ongoing  8. Pt will perform simple cold and hot meal prep with distant supv using RW and  walker tray or bag while seated or while standing up to 5 min.    Baseline: Continue 03/10/23: Pt able to stand 5 min (mostly static) with RW; family manages all meal prep.   Goal status: Ongoing  ASSESSMENT: CLINICAL IMPRESSION:  Measurements were  obtained and goals were reviewed with the Pt. Pt. Has progressed with R/L grip strength and L three point pinch. Pt demonstrated proper use of the 1 handed AE items for meal prep and was advised on options to obtain (1 handed cutting board and rocker knife).  Pt reported that she had not yet utilized her walker bag which was issued last session. Pt. Was able to grasp jumbo pegs with the L hand however often required the R hand to help turn/manipulate the jumbo peg to fit into the peg board. Pt. Was able to place 14 pegs onto peg board however required increased time and verbal cues. Pt. Was able to grasp onto hand grip strengthener, however required hand over hand assistance to help guide L arm to pick up the jumbo pegs due to limited ROM in LUE. Pt. Would continue to benefit from skilled OT services to increase engagement and independence in self care and home management tasks.  PERFORMANCE DEFICITS: in functional skills including ADLs, IADLs, coordination, dexterity, ROM, strength, pain, Fine motor control, and Gross motor control, cognitive skills including attention, problem solving, and safety awareness, and psychosocial skills including coping strategies, environmental adaptation, and routines and behaviors.   IMPAIRMENTS: are limiting patient from ADLs, IADLs, leisure, and social participation.   CO-MORBIDITIES: may have co-morbidities  that affects occupational performance. Patient will benefit from skilled OT to address above impairments and improve overall function.  MODIFICATION OR ASSISTANCE TO COMPLETE EVALUATION: Maximum or significant modification of tasks or assist is necessary to complete an evaluation.  OT OCCUPATIONAL PROFILE AND HISTORY:  Comprehensive assessment: Review of records and extensive additional review of physical, cognitive, psychosocial history related to current functional performance.  CLINICAL DECISION MAKING: High - multiple treatment options, significant modification of task necessary  REHAB POTENTIAL: Good  EVALUATION COMPLEXITY: High  PLAN:  OT FREQUENCY: 1x/week  OT DURATION: 6 weeks  PLANNED INTERVENTIONS: self care/ADL training, therapeutic exercise, therapeutic activity, neuromuscular re-education, manual therapy, passive range of motion, functional mobility training, electrical stimulation, and paraffin  RECOMMENDED OTHER SERVICES: OT  CONSULTED AND AGREED WITH PLAN OF CARE: Patient  PLAN FOR NEXT SESSION: therapeutic exercises, ADL/AE training   Mariabelen Pressly, OTS, 1:40 PM  This entire session was performed under the direct supervision and direction of a licensed therapist. I have personally read, edited, and approve of the note as written.   Olegario Messier, MS, OTR/L  03/28/2023

## 2023-03-30 ENCOUNTER — Ambulatory Visit: Payer: 59

## 2023-03-31 ENCOUNTER — Ambulatory Visit: Payer: 59

## 2023-04-03 ENCOUNTER — Ambulatory Visit: Payer: 59 | Attending: Internal Medicine

## 2023-04-03 ENCOUNTER — Ambulatory Visit: Payer: 59 | Admitting: Occupational Therapy

## 2023-04-03 DIAGNOSIS — M6281 Muscle weakness (generalized): Secondary | ICD-10-CM | POA: Insufficient documentation

## 2023-04-03 DIAGNOSIS — R278 Other lack of coordination: Secondary | ICD-10-CM | POA: Diagnosis present

## 2023-04-03 DIAGNOSIS — R2681 Unsteadiness on feet: Secondary | ICD-10-CM | POA: Insufficient documentation

## 2023-04-03 DIAGNOSIS — R262 Difficulty in walking, not elsewhere classified: Secondary | ICD-10-CM | POA: Diagnosis present

## 2023-04-03 DIAGNOSIS — M25561 Pain in right knee: Secondary | ICD-10-CM | POA: Insufficient documentation

## 2023-04-03 DIAGNOSIS — G8929 Other chronic pain: Secondary | ICD-10-CM | POA: Diagnosis present

## 2023-04-03 NOTE — Therapy (Signed)
Occupational Therapy Neuro Treatment Note  Patient Name: Dana Lucas MRN: [id number] DOB:09-Oct-1950, 72 y.o., female  PCP: Hinda Lenis, MD REFERRING PROVIDER: Sumner Boast, MD  END OF SESSION:  OT End of Session - 04/03/23 1050     Visit Number 21    Number of Visits 24    Date for OT Re-Evaluation 04/21/23    OT Start Time 1015    OT Stop Time 1045    OT Time Calculation (min) 30 min    Equipment Utilized During Treatment power wc, walker    Activity Tolerance Patient tolerated treatment well    Behavior During Therapy WFL for tasks assessed/performed             Past Medical History:  Diagnosis Date   Diabetes mellitus without complication (HCC)    Hypertension    Stroke [facility])    Past Surgical History:  Procedure Laterality Date   ABDOMINAL HYSTERECTOMY     COLONOSCOPY N/A 10/06/2019   Procedure: COLONOSCOPY;  Surgeon: Toledo, Boykin Nearing, MD;  Location: [facility] ENDOSCOPY;  Service: Gastroenterology;  Laterality: N/A;   Patient Active Problem List   Diagnosis Date Noted   Hypertension    Diabetes mellitus without complication (HCC)    Stroke (HCC)    GIB (gastrointestinal bleeding)    AKI (acute kidney injury) (HCC)    UTI (urinary tract infection)    ONSET DATE: 05/2014  REFERRING DIAG: CVA  THERAPY DIAG:  Muscle weakness (generalized)  Rationale for Evaluation and Treatment: Rehabilitation  SUBJECTIVE:  SUBJECTIVE STATEMENT: Pt. Reports that she has to leave 15 minutes early from therapy session because her ride will be here then. Pt. Reports that she had a good weekend spending time with family that came into town to see her. Pt accompanied by: family member , DIL [name])  PERTINENT HISTORY: Pt. is a 72 y.o. female who has a history of multiple CVAs beginning in 2015. Pt. 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   PRECAUTIONS: None  WEIGHT BEARING RESTRICTIONS: No  PAIN:  Are you having pain? Pt. Reported  that she is having 5/10 pain in R hand today. FALLS: Has patient fallen in last 6 months? No  LIVING ENVIRONMENT: Lives with: lives with their family and lives with their son Lives in: House/apartment Stairs: one story, ramped entrance Has following equipment at home: Wheelchair (power) and Wheelchair (manual), 2 wheeled walker, 3 wheeled walker, power lift, hospital bed, transfer shower bench  PLOF: Independent  PATIENT GOALS: To be able to walk again, and to drive  OBJECTIVE:   HAND DOMINANCE: Right  ADLs: Transfers/ambulation related to ADLs: Eating: Independent Grooming: Independent UB Dressing: Independent LB Dressing: Independent pants, MaxA shoes, and socks Toileting: Assist with transfers, reports independence with toileting care Bathing: Assist from DIL Tub Shower transfers: Min-ModA shower transfers Equipment: See above  IADLs: Shopping: DIL performs shopping Light housekeeping: Unable Meal Prep: Pt. Is able to retrieve a beverage, or snack, light meal prep Community mobility: Power w/c Medication management: Grand daughter assists with pillbox set-up.  Pt. Independently takes medication Financial management: No changes Handwriting: Name: 100% legible in printed form, 75% in cursive form  MOBILITY STATUS: Needs Assist: Uses power w/c  POSTURE COMMENTS:  Sitting balance: WFL supported  FUNCTIONAL OUTCOME MEASURES: FOTO: 33 TR:39 02/02/23: 44 03/10/23: 40 03/28/23: 44 TR: 39  UPPER EXTREMITY ROM:    Active ROM Right WFL Left eval Left 02/02/23 Left  03/10/23  Shoulder flexion  0(93) 0 (90) 0 (  90)  Shoulder abduction  30(83) 35 (90)  40 (90)  Shoulder adduction      Shoulder extension      Shoulder internal rotation      Shoulder external rotation      Elbow flexion  0-87(0-122) 80   Elbow extension   -40   Wrist flexion  52 82   Wrist extension  10(50) 0 (58) 0 (70)  Wrist ulnar deviation      Wrist radial deviation      Wrist pronation      Wrist  supination      (Blank rows = not tested)  Full Digit flexion to the [facility] Active digit extension through 75% of the range grossly Active thumb radial, and palmar abduction  03/10/23: Active digit extension through full range grossly through digits 1-5  UPPER EXTREMITY MMT:     MMT Right eval Left eval Left 02/02/23 Left 03/10/23  Shoulder flexion  0/5 0 0  Shoulder abduction  2-/5 2- 2-  Shoulder adduction      Shoulder extension      Shoulder internal rotation      Shoulder external rotation      Middle trapezius      Lower trapezius      Elbow flexion  3/5 3- 3-  Elbow extension      Wrist flexion      Wrist extension  2/5 2 3-  Wrist ulnar deviation      Wrist radial deviation      Wrist pronation      Wrist supination      (Blank rows = not tested)  HAND FUNCTION: Grip strength: Right: 18 lbs; Left: 10 lbs, Lateral pinch: Right: 5 lbs, Left: 11 lbs, and 3 point pinch: Right: 1 lbs, Left: 15 lbs 02/02/23: Grip strength: Right: 8 lbs (limited by pain); Left: 25 lbs, Lateral pinch: Right: 5 lbs, Left: 12 lbs, and 3 point pinch: Right: 4 lbs, Left: 6 lbs 03/10/23: Grip strength: Right: 12 lbs (limited by pain); Left: 25 lbs, Lateral pinch: Right: 8 lbs, Left: 12 lbs, and 3 point pinch: Right: 7 lbs, Left: 5 lbs 03/28/23: Grip strength: Right: 19 lbs; Left: 25 lbs, Lateral pinch: Right: 8 lbs, Left: 11 lbs, and 3 point pinch: Right: 7 lbs, Left: 12 lbs  COORDINATION: Eval: N/A 02/02/23: 9 hole R: 42 sec; L: (unable) Able to pick up 1 peg and place in hole  03/10/23: R unable: Pt able to pick up 1 peg but unable to place in hole; pt can remove a peg from the hole  SENSATION: WFL  EDEMA: TBD  MUSCLE TONE: LUE: Hypotonic  COGNITION: Overall cognitive status: Within functional limits for tasks assessed  VISION: Subjective report:  Pt. Reports having an Eye appointment Baseline vision: Wears glasses all the time  VISION ASSESSMENT: To be further assessed in functional  context  PRAXIS: Impaired: Motor planning  TODAY'S TREATMENT:    Self Care: Pt. Reported that she has started using the walker bag and has found it very helpful. Pt. Worked on meal prep tasks including standing at the sink to wash hands, functional reaching into the 1st and 2nd shelves of cupboard using the R hand to remove bowls and cups from top and middle shelf. Pt. Worked on transferring bowl across the countertop to the microwave and opening microwave to simulate warming up food. Pt. Transferred bowl back to sink to simulate taking it to the sink to wash it. Pt. Used RW  throughout meal prep tasks. Pt. Was educated on the benefits of using a walker tray in the kitchen and how to properly place it on the walker. Pt. Tried using the walker tray however it was taken off due to the tray not allowing the patient space to step into the walker to ensure balance.  Therapeutic Exercise:  Pt. Worked on using L hand to grasp jumbo pegs and place them onto the jumbo peg board. Pt. Worked on removing the jumbo pegs from the peg board using the hand grip strengthener set to 6.6# for 1x trial.  Pt. 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 Yes  SHORT TERM GOALS: Target date: 03/31/23 (3 weeks)   Pt. Will demonstrate independence with HEPs for the LUE. Baseline: Eval: No current HEP; 02/02/23: Pt demos self passive stretching at the shoulder in a couple different planes (horiz add, flexion); pt has been encouraged to perform towel squeezes for grip strengthening, but pt reports she has not yet done this at home; 03/10/23: Pt verbalizes/demos understanding of table slides for LUE mobility and towel  squeezes for L grip strengthening, though uncertain of frequency of participation at home. Goal status: achieved  LONG TERM GOALS: Target date: 04/21/23 (6 weeks)  Pt. Will increase FOTO score by 2 points for Pt. perceived improvement with assessment specific ADL/IADL improvement. Baseline: Eval: FOTO 33, TR score: 39; 02/02/23: FOTO 44; 03/10/23: 40 03/28/23: 44 Goal status: achieved/ongoing  2.  Pt. Will improve left shoulder ROM by 10 degrees to assist with self-dressing. Baseline: Left shoulder flexion: 0(93), Abduction: 30(83); 02/02/23: L shoulder flexion 0 (90), abd 35 (90); 03/10/23: L shoulder flexion 0 (90), abd 40 (90) Goal status: Not met/discontinue  3.  Pt. Will improve left wrist extension by 10 in preparation for functional reaching Baseline: Left wrist extension: 10(50); 02/02/23: L wrist ext 0 (58); 03/10/23: L wrist ext active to neutral (passively to 70) Goal status: Not met/discontinue  4.  Pt. Will increase left grip strength to 30# or more in preparation for being able to securely stabilize ADL/IADL items at the tabletop. (revised from 5# on 02/02/23) Baseline: Left grip strength: 10#; 02/02/23: L 25#; 03/10/23: L grip 25# 6/25: L grip 25# Goal status: ongoing  5. Pt will perform tub transfer with leg lifter and transfer tub bench with supv/set up. Baseline: Min A with sit<>stand from tub bench, mod A to lift L leg over tub; 02/02/23: supv with sit<>stand from tub bench, min A to lift L leg over tub with use of leg lifter; 03/10/23: DIL helps to lift legs over tub if performing in the p.m. d/t fatigue end of day, in the a.m pt can use leg lifter to lift legs over tub with supv/set up while seated on transfer tub bench  Goal status: partially met; discontinue  6. Pt will perform UB bathing with min A.  Baseline: Mod A; 02/02/23: min A; 03/10/23: capable of performing with min A, but DIL typically does all the washing.  Encouraged pt to increase participation for maximizing overall strength,  activity tolerance, and indep with ADLs.  Goal status: achieved  7. Pt will utilize walker tray/bag to be able to transport food/drink items from refrigerator to table top with modified indep.   Baseline:03/28/2023: Continue  03/10/23: Family manages food/drink set up; walker bag issued this date and educ on walker tray options.  Goal status: Ongoing  8. Pt will perform simple cold and hot meal prep with distant supv using RW and walker tray or bag while seated or while standing up to 5 min.    Baseline: Continue 03/10/23: Pt able to stand 5 min (mostly static) with RW; family manages all meal prep.   Goal status: Ongoing  ASSESSMENT: CLINICAL IMPRESSION:  Pt reported that she is now using the walker bag when she is in the kitchen and has found it useful. Pt. Required min A for sit to stand from wc to supported standing at the countertop and min guard for balance with functional reaching and transporting items along the countertop. Pt. Required increased verbal cues when positioning the walker throughout meal prep tasks. Pt. Was educated on walker tray and demonstrated proper use within the kitchen however, tray was removed due to it limiting the Pt. For being able to step into the walker completely.  Pt. Was able to grasp jumbo pegs with the L hand however often required the R hand to help turn/manipulate the jumbo peg to fit into the peg board. Pt. Was able to place 12 pegs onto peg board however required increased time and verbal cues. Pt. Was able to grasp onto hand grip strengthener, however required hand over hand assistance to help guide L arm to pick up the jumbo pegs due to limited ROM in LUE. Pt. Would continue to benefit from skilled OT services to increase engagement and independence in self care and home management tasks.  PERFORMANCE DEFICITS: in functional skills including ADLs, IADLs, coordination, dexterity, ROM, strength, pain, Fine motor control, and Gross motor control, cognitive skills  including attention, problem solving, and safety awareness, and psychosocial skills including coping strategies, environmental adaptation, and routines and behaviors.   IMPAIRMENTS: are limiting patient from ADLs, IADLs, leisure, and social participation.   CO-MORBIDITIES: may have co-morbidities  that affects occupational performance. Patient will benefit from skilled OT to address above impairments and improve overall function.  MODIFICATION OR ASSISTANCE TO COMPLETE EVALUATION: Maximum or significant modification of tasks or assist is necessary to complete an evaluation.  OT OCCUPATIONAL PROFILE AND HISTORY: Comprehensive assessment: Review of records and extensive additional review of physical, cognitive, psychosocial history related to current functional performance.  CLINICAL DECISION MAKING: High - multiple treatment options, significant modification of task necessary  REHAB POTENTIAL: Good  EVALUATION COMPLEXITY: High  PLAN:  OT FREQUENCY: 1x/week  OT DURATION: 6 weeks  PLANNED INTERVENTIONS: self care/ADL training, therapeutic exercise, therapeutic activity, neuromuscular re-education, manual therapy, passive range of motion, functional mobility training, electrical stimulation, and paraffin  RECOMMENDED OTHER SERVICES: OT  CONSULTED AND AGREED WITH PLAN OF CARE: Patient  PLAN FOR NEXT SESSION: therapeutic exercises, ADL/AE training   Herma Carson, OTS 12:00 PM 04/03/23

## 2023-04-03 NOTE — Therapy (Signed)
OUTPATIENT PHYSICAL THERAPY NEURO TREATMENT NOTE    Patient Name: Dana Lucas MRN: [id number] DOB:Nov 05, 1950, 72 y.o., female Today's Date: 04/03/2023   PCP: Bobbye Morton MD REFERRING PROVIDER: Louis Matte MD   END OF SESSION:  PT End of Session - 04/03/23 0925     Visit Number 23    Number of Visits 38    Date for PT Re-Evaluation 05/24/23    Authorization Type [employer] Medicare; [place] Medicaid    Authorization Time Period 03/01/23-05/24/23    Progress Note Due on Visit 30    PT Start Time 0931    PT Stop Time 1014    PT Time Calculation (min) 43 min    Equipment Utilized During Treatment Gait belt    Activity Tolerance Patient tolerated treatment well    Behavior During Therapy WFL for tasks assessed/performed                     Past Medical History:  Diagnosis Date   Diabetes mellitus without complication (HCC)    Hypertension    Stroke [facility])    Past Surgical History:  Procedure Laterality Date   ABDOMINAL HYSTERECTOMY     COLONOSCOPY N/A 10/06/2019   Procedure: COLONOSCOPY;  Surgeon: Toledo, Boykin Nearing, MD;  Location: [facility] ENDOSCOPY;  Service: Gastroenterology;  Laterality: N/A;   Patient Active Problem List   Diagnosis Date Noted   Hypertension    Diabetes mellitus without complication (HCC)    Stroke (HCC)    GIB (gastrointestinal bleeding)    AKI (acute kidney injury) (HCC)    UTI (urinary tract infection)     ONSET DATE: 2020  REFERRING DIAG: 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   THERAPY DIAG:  Muscle weakness (generalized)  Difficulty in walking, not elsewhere classified  Unsteadiness on feet  Other lack of coordination  Rationale for Evaluation and Treatment: Rehabilitation  SUBJECTIVE:                                                                                                                                                                                             SUBJECTIVE  STATEMENT: Pt reports she was measured for AFO. Waiting for next appt to receive AFO. No other updates reported, no concerns.   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 Stroke was four years ago, has had multiple strokes. Had a little bit of therapy, afterwards in [place]. Moved up to [place] about three years ago. [name] includes DM with diabetic neuropathy, stroke,  HTN, AKI, UTI. Uses a walker to walk to the bathroom (two wheel and three wheel walkers).  Has a power chair for out of the house.   PAIN:  Are you having pain? Yes Right hand    PRECAUTIONS: Fall  WEIGHT BEARING RESTRICTIONS: No  FALLS: Has patient fallen in last 6 months? No  LIVING ENVIRONMENT: Lives with: lives with their family Lives in: House/apartment Stairs:  ramp outside Has following equipment at home: [name] - 2 wheeled, Environmental consultant - 4 wheeled, Wheelchair (power), shower chair, Ramped entry, and chair lift and hospital bed   PLOF: Independent  PATIENT GOALS: get up and walk by herself, wants to walk with a cane.   OBJECTIVE:   DIAGNOSTIC FINDINGS: CVA in [place]; no imaging in system  COGNITION: Overall cognitive status:  a little bit per granddaughter.    SENSATION: WFL  COORDINATION: Limited LLE     POSTURE: rounded shoulders, forward head, flexed trunk , and weight shift right    LOWER EXTREMITY MMT:    MMT Right Eval Left Eval  Hip flexion 4 2  Hip extension    Hip abduction 4 2+  Hip adduction 4 2+  Knee flexion 4 2-  Knee extension 4 2+  Ankle dorsiflexion 3   Ankle plantarflexion 3   (Blank rows = not tested)   TRANSFERS: Assistive device utilized: Environmental consultant - 2 wheeled  Sit to stand: CGA and Min A; blocking of L foot  Stand to sit: CGA Chair to chair: Min A    GAIT: Gait pattern: step to pattern, decreased stance time- Left, decreased ankle dorsiflexion- Left, and poor  foot clearance- Left Distance walked: 1ft Assistive device utilized: Environmental consultant - 2 wheeled Level of assistance: CGA Comments: L knee hyperextension with weightbearing  FUNCTIONAL TESTS:  5 times sit to stand: 38 seconds with SUE support  10 meter walk test: 1 min 43 seconds with RW  PATIENT SURVEYS:  FOTO 46  TODAY'S TREATMENT: DATE: 04/03/23   TE:  STS 1x from powerchair to RW and close CGA for ambulation x 5 ft to nustep chair.  Nustep interval training (pt buckled in). PT adjusts intensity throughout and monitors pt for response to maintain therapeutic range. Min assist provided for LE positioning in chair and mount/dismount.  Lvl 1 x 2 min Lvl 2 x 1 min Lvl 3 x 2 min Lvl 4 x 1 min Lvl 3 x 1 min Lvl 2 x 1 min Lvl 1 x 2 min with cool-down minute. Pt maintains SPM in 70s-80s  Ambulation with RW and close CGA x 72 ft, x 87 ft, 52 ft.  Rest break between sets due to fatigue  STS 2x5 with using UUE to push to stand, CGA-min assist    PATIENT EDUCATION: Education details: Pt educated throughout session about proper posture and technique with exercises. Improved exercise technique, movement at target joints, use of target muscles after min to mod verbal, visual, tactile cues.   Person educated: Patient and daughter [name] Education method: Explanation, Demonstration, Tactile cues, and Verbal cues Education comprehension: verbalized understanding, returned demonstration, verbal cues required, tactile cues required, and needs further education  HOME EXERCISE PROGRAM: Access Code: [email or web address] URL: [email or web address] Date: 12/06/2022 Prepared by: Precious Bard  Exercises - Leg Extension  - 1 x daily - 7 x weekly - 2 sets - 10 reps - 5 hold - Seated March  - 1 x daily - 7 x weekly - 2 sets - 10 reps - 5 hold - Seated  Hip Abduction  - 1 x daily - 7 x weekly - 2 sets - 10 reps - 5 hold - Seated Hip Adduction Isometrics with Ball  - 1 x daily - 7 x weekly - 2  sets - 10 reps - 5 hold - Seated Weight Shifting Without Arm Support  - 1 x daily - 7 x weekly - 2 sets - 10 reps - 5 hold   GOALS: Goals reviewed with patient? Yes  SHORT TERM GOALS: Target date: 01/03/2023    Patient will be independent in home exercise program to improve strength/mobility for better functional independence with ADLs. Baseline: 01/26/2023: pt has some difficulty with HEP Goal status: IN PROGRESS    LONG TERM GOALS: Target date: 02/28/2023    Patient will increase FOTO score to equal to or greater than 53    to demonstrate statistically significant improvement in mobility and quality of life.  Baseline: 3/5: 46%; 01/26/23: 45% ; 03/01/23: 42%  Goal status: IN PROGRESS  2.  Patient (> 55 years old) will complete five times sit to stand test in < 15 seconds indicating an increased LE strength and improved balance. Baseline: 3/5: 38 seconds with SUE support; blocking of LLE; 01/26/23 18 seconds with pulling to stand; 03/01/23: 17 seconds pull to stand  Goal status: Partially MET  3.  Patient will increase 10 meter walk test to >1.30m/s as to improve gait speed for better community ambulation and to reduce fall risk Baseline: 3/5: 1 min 43 seconds with RW and w/c follow; 01/26/2023: 2 min 6 seconds; 03/01/23: 1 min 11 sec, 0.14 m/s with WC follow and 2WW  Goal status: IN PROGRESS  4.  Patient will increase BLE gross strength to 4/5 as to improve functional strength for independent gait, increased standing tolerance and increased ADL ability. Baseline: 3/5: grossly 2/5 LLE; 4//25/24: RLE grossly 4-/5, LLE grossly 2/5; 02/28/22 RLE grossly 4+/5 and LLE grossly 3+/5, exception 0/5 PF/DF (overall gross Bilat LE strength around 4-/5) Goal status: PARTIALLY MET    ASSESSMENT:  CLINICAL IMPRESSION:   Pt continues to put forth great effort throughout the session and tolerates interventions well without pain and without significant fatigue. Pt able to progress nustep interval  training and follow this up with other higher intensity interventions such as multiple bouts of ambulation with RW and STS training.  Pt will continue to benefit from skilled therapy to address remaining deficits in order to improve overall QoL and return to PLOF.       OBJECTIVE IMPAIRMENTS: Abnormal gait, cardiopulmonary status limiting activity, decreased activity tolerance, decreased balance, decreased coordination, decreased endurance, decreased knowledge of use of DME, decreased mobility, difficulty walking, decreased ROM, decreased strength, impaired perceived functional ability, impaired flexibility, impaired UE functional use, improper body mechanics, postural dysfunction, obesity, and pain.   ACTIVITY LIMITATIONS: carrying, lifting, bending, sitting, standing, squatting, sleeping, stairs, transfers, bed mobility, bathing, toileting, dressing, reach over head, and caring for others  PARTICIPATION LIMITATIONS: meal prep, cleaning, laundry, interpersonal relationship, driving, shopping, community activity, and church  PERSONAL FACTORS: Age, Behavior pattern, Education, Fitness, Past/current experiences, Profession, Sex, Time since onset of injury/illness/exacerbation, and 3+ comorbidities: DM with diabetic neuropathy, stroke, HTN, AKI, UTI  are also affecting patient's functional outcome.   REHAB POTENTIAL: Fair length of time since CVA  CLINICAL DECISION MAKING: Evolving/moderate complexity  EVALUATION COMPLEXITY: Moderate  PLAN:  PT FREQUENCY: 2x/week  PT DURATION: 12 weeks  PLANNED INTERVENTIONS: Therapeutic exercises, Therapeutic activity, Neuromuscular re-education, Balance training, Gait training, Patient/Family  education, Self Care, Joint mobilization, Stair training, Vestibular training, Canalith repositioning, Visual/preceptual remediation/compensation, Orthotic/Fit training, DME instructions, Cognitive remediation, Electrical stimulation, Wheelchair mobility training, Spinal  mobilization, Cryotherapy, Moist heat, Splintting, Taping, Traction, Ultrasound, Manual therapy, and Re-evaluation  PLAN FOR NEXT SESSION: stand in // bars, work on walking, strengthening LLE, STS  Temple Pacini PT, DPT  Physical Therapist - [facility]  04/03/23, 10:20 AM

## 2023-04-07 ENCOUNTER — Ambulatory Visit: Payer: 59 | Admitting: Physical Therapy

## 2023-04-07 ENCOUNTER — Encounter: Payer: Self-pay | Admitting: Physical Therapy

## 2023-04-07 ENCOUNTER — Ambulatory Visit: Payer: 59

## 2023-04-07 DIAGNOSIS — M6281 Muscle weakness (generalized): Secondary | ICD-10-CM | POA: Diagnosis not present

## 2023-04-07 DIAGNOSIS — R262 Difficulty in walking, not elsewhere classified: Secondary | ICD-10-CM

## 2023-04-07 DIAGNOSIS — R278 Other lack of coordination: Secondary | ICD-10-CM

## 2023-04-07 DIAGNOSIS — R2681 Unsteadiness on feet: Secondary | ICD-10-CM

## 2023-04-07 NOTE — Therapy (Signed)
OUTPATIENT PHYSICAL THERAPY NEURO TREATMENT NOTE    Patient Name: Dana Lucas MRN: [id number] DOB:July 10, 1951, 72 y.o., female Today's Date: 04/07/2023   PCP: Bobbye Morton MD REFERRING PROVIDER: Louis Matte MD   END OF SESSION:  PT End of Session - 04/07/23 0813     Visit Number 24    Number of Visits 38    Date for PT Re-Evaluation 05/24/23    Authorization Type [employer] Medicare; [place] Medicaid    Authorization Time Period 03/01/23-05/24/23    Progress Note Due on Visit 30    PT Start Time 0815    PT Stop Time 0859    PT Time Calculation (min) 44 min    Equipment Utilized During Treatment Gait belt    Activity Tolerance Patient tolerated treatment well;Patient limited by fatigue (P)     Behavior During Therapy WFL for tasks assessed/performed                      Past Medical History:  Diagnosis Date   Diabetes mellitus without complication (HCC)    Hypertension    Stroke [facility])    Past Surgical History:  Procedure Laterality Date   ABDOMINAL HYSTERECTOMY     COLONOSCOPY N/A 10/06/2019   Procedure: COLONOSCOPY;  Surgeon: Toledo, Boykin Nearing, MD;  Location: [facility] ENDOSCOPY;  Service: Gastroenterology;  Laterality: N/A;   Patient Active Problem List   Diagnosis Date Noted   Hypertension    Diabetes mellitus without complication (HCC)    Stroke (HCC)    GIB (gastrointestinal bleeding)    AKI (acute kidney injury) (HCC)    UTI (urinary tract infection)     ONSET DATE: 2020  REFERRING DIAG: 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   THERAPY DIAG:  Muscle weakness (generalized)  Difficulty in walking, not elsewhere classified  Other lack of coordination  Unsteadiness on feet  Rationale for Evaluation and Treatment: Rehabilitation  SUBJECTIVE:                                                                                                                                                                                              SUBJECTIVE STATEMENT:   Pt reports doing well today, notes no falls/stumbles since last visit.  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 Stroke was four years ago, has had multiple strokes. Had a little bit of therapy, afterwards in [place]. Moved up to [place] about three years ago. [name] includes DM with diabetic neuropathy, stroke, HTN, AKI,  UTI. Uses a walker to walk to the bathroom (two wheel and three wheel walkers).  Has a power chair for out of the house.   PAIN:  Are you having pain? Yes Right hand    PRECAUTIONS: Fall  WEIGHT BEARING RESTRICTIONS: No  FALLS: Has patient fallen in last 6 months? No  LIVING ENVIRONMENT: Lives with: lives with their family Lives in: House/apartment Stairs:  ramp outside Has following equipment at home: [name] - 2 wheeled, Environmental consultant - 4 wheeled, Wheelchair (power), shower chair, Ramped entry, and chair lift and hospital bed   PLOF: Independent  PATIENT GOALS: get up and walk by herself, wants to walk with a cane.   OBJECTIVE:   DIAGNOSTIC FINDINGS: CVA in [place]; no imaging in system  COGNITION: Overall cognitive status:  a little bit per granddaughter.    SENSATION: WFL  COORDINATION: Limited LLE     POSTURE: rounded shoulders, forward head, flexed trunk , and weight shift right    LOWER EXTREMITY MMT:    MMT Right Eval Left Eval  Hip flexion 4 2  Hip extension    Hip abduction 4 2+  Hip adduction 4 2+  Knee flexion 4 2-  Knee extension 4 2+  Ankle dorsiflexion 3   Ankle plantarflexion 3   (Blank rows = not tested)   TRANSFERS: Assistive device utilized: Environmental consultant - 2 wheeled  Sit to stand: CGA and Min A; blocking of L foot  Stand to sit: CGA Chair to chair: Min A    GAIT: Gait pattern: step to pattern, decreased stance time- Left, decreased ankle dorsiflexion- Left, and poor foot  clearance- Left Distance walked: 13ft Assistive device utilized: Environmental consultant - 2 wheeled Level of assistance: CGA Comments: L knee hyperextension with weightbearing  FUNCTIONAL TESTS:  5 times sit to stand: 38 seconds with SUE support  10 meter walk test: 1 min 43 seconds with RW  PATIENT SURVEYS:  FOTO 46  TODAY'S TREATMENT: DATE: 04/07/23 Unless otherwise stated, close CGA was provided and gait belt donned in order to ensure pt safety  TE: STS 1x from powerchair to RW; ambulation x 5 ft to nustep chair.  Nustep interval training (pt buckled in). PT adjusts intensity throughout and monitors pt for response to maintain therapeutic range. Min assist provided for LE positioning in chair and mount/dismount.  Lvl 1 x 1 min Lvl 2 x 2 min Lvl 3 x 1 min Lvl 4 x 1 min Lvl 3 x 1 min Lvl 2 x 2 min Lvl 1 x 2 min with cool-down minute. Pt maintains SPM in 70s-80s  Amb x 14 feet with RW from NuStep to balance bar.  NMR: Lateral weight shifts x 10 at balance bar  Mini-Squats at balance bar, BUE support. SPT seated to L side of pt, blocking pt's post. knee with therapist's knee to prevent L knee hyperextension. 3 sets x 10 reps with 1 seated and 2 standing rest breaks between.   Ambulation with RW, close CGA, SPT providing L knee hyperextension blocking with each step; x 126 feet, x 43 feet. Rest break between bouts due to fatigue   PATIENT EDUCATION: Education details: Pt educated throughout session about proper posture and technique with exercises. Improved exercise technique, movement at target joints, use of target muscles after min to mod verbal, visual, tactile cues.   Person educated: Patient and daughter [name] Education method: Explanation, Demonstration, Tactile cues, and Verbal cues Education comprehension: verbalized understanding, returned demonstration, verbal cues required, tactile cues required, and  needs further education  HOME EXERCISE PROGRAM: Access Code:  [email or web address] URL: [email or web address] Date: 12/06/2022 Prepared by: Precious Bard  Exercises - Leg Extension  - 1 x daily - 7 x weekly - 2 sets - 10 reps - 5 hold - Seated March  - 1 x daily - 7 x weekly - 2 sets - 10 reps - 5 hold - Seated Hip Abduction  - 1 x daily - 7 x weekly - 2 sets - 10 reps - 5 hold - Seated Hip Adduction Isometrics with Ball  - 1 x daily - 7 x weekly - 2 sets - 10 reps - 5 hold - Seated Weight Shifting Without Arm Support  - 1 x daily - 7 x weekly - 2 sets - 10 reps - 5 hold   GOALS: Goals reviewed with patient? Yes  SHORT TERM GOALS: Target date: 01/03/2023    Patient will be independent in home exercise program to improve strength/mobility for better functional independence with ADLs. Baseline: 01/26/2023: pt has some difficulty with HEP Goal status: IN PROGRESS    LONG TERM GOALS: Target date: 02/28/2023    Patient will increase FOTO score to equal to or greater than 53    to demonstrate statistically significant improvement in mobility and quality of life.  Baseline: 3/5: 46%; 01/26/23: 45% ; 03/01/23: 42%  Goal status: IN PROGRESS  2.  Patient (> 56 years old) will complete five times sit to stand test in < 15 seconds indicating an increased LE strength and improved balance. Baseline: 3/5: 38 seconds with SUE support; blocking of LLE; 01/26/23 18 seconds with pulling to stand; 03/01/23: 17 seconds pull to stand  Goal status: Partially MET  3.  Patient will increase 10 meter walk test to >1.12m/s as to improve gait speed for better community ambulation and to reduce fall risk Baseline: 3/5: 1 min 43 seconds with RW and w/c follow; 01/26/2023: 2 min 6 seconds; 03/01/23: 1 min 11 sec, 0.14 m/s with WC follow and 2WW  Goal status: IN PROGRESS  4.  Patient will increase BLE gross strength to 4/5 as to improve functional strength for independent gait, increased standing tolerance and increased ADL ability. Baseline: 3/5: grossly 2/5 LLE; 4//25/24:  RLE grossly 4-/5, LLE grossly 2/5; 02/28/22 RLE grossly 4+/5 and LLE grossly 3+/5, exception 0/5 PF/DF (overall gross Bilat LE strength around 4-/5) Goal status: PARTIALLY MET    ASSESSMENT:  CLINICAL IMPRESSION:  Patient appeared motivated and ready for treatment on this day.  Tolerated session interventions well, continued with NuStep intervention per previous visit with good results. Introduced external blocking during squat exercise per above for pt's L knee to work on  controlling hyperextension. Pt able to ambulate longer distance without needing to take a rest break compared to previous sessions. Pt will continue to benefit from skilled therapy to address remaining deficits in order to improve overall QoL and return to PLOF.     OBJECTIVE IMPAIRMENTS: Abnormal gait, cardiopulmonary status limiting activity, decreased activity tolerance, decreased balance, decreased coordination, decreased endurance, decreased knowledge of use of DME, decreased mobility, difficulty walking, decreased ROM, decreased strength, impaired perceived functional ability, impaired flexibility, impaired UE functional use, improper body mechanics, postural dysfunction, obesity, and pain.   ACTIVITY LIMITATIONS: carrying, lifting, bending, sitting, standing, squatting, sleeping, stairs, transfers, bed mobility, bathing, toileting, dressing, reach over head, and caring for others  PARTICIPATION LIMITATIONS: meal prep, cleaning, laundry, interpersonal relationship, driving, shopping, community activity, and church  PERSONAL FACTORS: Age, Behavior pattern, Education,  Fitness, Past/current experiences, Profession, Sex, Time since onset of injury/illness/exacerbation, and 3+ comorbidities: DM with diabetic neuropathy, stroke, HTN, AKI, UTI  are also affecting patient's functional outcome.   REHAB POTENTIAL: Fair length of time since CVA  CLINICAL DECISION MAKING: Evolving/moderate complexity  EVALUATION COMPLEXITY:  Moderate  PLAN:  PT FREQUENCY: 2x/week  PT DURATION: 12 weeks  PLANNED INTERVENTIONS: Therapeutic exercises, Therapeutic activity, Neuromuscular re-education, Balance training, Gait training, Patient/Family education, Self Care, Joint mobilization, Stair training, Vestibular training, Canalith repositioning, Visual/preceptual remediation/compensation, Orthotic/Fit training, DME instructions, Cognitive remediation, Electrical stimulation, Wheelchair mobility training, Spinal mobilization, Cryotherapy, Moist heat, Splintting, Taping, Traction, Ultrasound, Manual therapy, and Re-evaluation  PLAN FOR NEXT SESSION: stand in // bars, work on walking, strengthening LLE, STS  [employer], SPT  Norman Herrlich PT ,DPT Physical Therapist- [place]  [facility]   04/07/23, 9:23 AM

## 2023-04-11 ENCOUNTER — Ambulatory Visit: Payer: 59 | Admitting: Occupational Therapy

## 2023-04-11 ENCOUNTER — Ambulatory Visit: Payer: 59

## 2023-04-11 DIAGNOSIS — M6281 Muscle weakness (generalized): Secondary | ICD-10-CM

## 2023-04-11 DIAGNOSIS — R262 Difficulty in walking, not elsewhere classified: Secondary | ICD-10-CM

## 2023-04-11 DIAGNOSIS — R2681 Unsteadiness on feet: Secondary | ICD-10-CM

## 2023-04-11 DIAGNOSIS — R278 Other lack of coordination: Secondary | ICD-10-CM

## 2023-04-11 NOTE — Therapy (Addendum)
Occupational Therapy Neuro Treatment Note  Patient Name: Dana Lucas MRN: [id number] DOB:1950-12-05, 72 y.o., female  PCP: Hinda Lenis, MD REFERRING PROVIDER: Sumner Boast, MD  END OF SESSION:  OT End of Session - 04/11/23 0855     Visit Number 22    Number of Visits 24    Date for OT Re-Evaluation 04/21/23    OT Start Time 0800    OT Stop Time 0845    OT Time Calculation (min) 45 min    Equipment Utilized During Treatment power wc, walker    Activity Tolerance Patient tolerated treatment well    Behavior During Therapy WFL for tasks assessed/performed             Past Medical History:  Diagnosis Date   Diabetes mellitus without complication (HCC)    Hypertension    Stroke [facility])    Past Surgical History:  Procedure Laterality Date   ABDOMINAL HYSTERECTOMY     COLONOSCOPY N/A 10/06/2019   Procedure: COLONOSCOPY;  Surgeon: Toledo, Boykin Nearing, MD;  Location: [facility] ENDOSCOPY;  Service: Gastroenterology;  Laterality: N/A;   Patient Active Problem List   Diagnosis Date Noted   Hypertension    Diabetes mellitus without complication (HCC)    Stroke (HCC)    GIB (gastrointestinal bleeding)    AKI (acute kidney injury) (HCC)    UTI (urinary tract infection)    ONSET DATE: 05/2014  REFERRING DIAG: CVA  THERAPY DIAG:  Muscle weakness (generalized)  Rationale for Evaluation and Treatment: Rehabilitation  SUBJECTIVE:  SUBJECTIVE STATEMENT: Pt. Reports that she is doing well today and that she had a good relaxing weekend. Pt accompanied by: family member , DIL [name])  PERTINENT HISTORY: Pt. is a 72 y.o. female who has a history of multiple CVAs beginning in 2015. Pt. 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   PRECAUTIONS: None  WEIGHT BEARING RESTRICTIONS: No  PAIN:  Are you having pain? Pt. Reported that she is not in any pain today FALLS: Has patient fallen in last 6 months? No  LIVING ENVIRONMENT: Lives with: lives  with their family and lives with their son Lives in: House/apartment Stairs: one story, ramped entrance Has following equipment at home: Wheelchair (power) and Wheelchair (manual), 2 wheeled walker, 3 wheeled walker, power lift, hospital bed, transfer shower bench  PLOF: Independent  PATIENT GOALS: To be able to walk again, and to drive  OBJECTIVE:   HAND DOMINANCE: Right  ADLs: Transfers/ambulation related to ADLs: Eating: Independent Grooming: Independent UB Dressing: Independent LB Dressing: Independent pants, MaxA shoes, and socks Toileting: Assist with transfers, reports independence with toileting care Bathing: Assist from DIL Tub Shower transfers: Min-ModA shower transfers Equipment: See above  IADLs: Shopping: DIL performs shopping Light housekeeping: Unable Meal Prep: Pt. Is able to retrieve a beverage, or snack, light meal prep Community mobility: Power w/c Medication management: Grand daughter assists with pillbox set-up.  Pt. Independently takes medication Financial management: No changes Handwriting: Name: 100% legible in printed form, 75% in cursive form  MOBILITY STATUS: Needs Assist: Uses power w/c  POSTURE COMMENTS:  Sitting balance: WFL supported  FUNCTIONAL OUTCOME MEASURES: FOTO: 33 TR:39 02/02/23: 44 03/10/23: 40 03/28/23: 44 TR: 39  UPPER EXTREMITY ROM:    Active ROM Right WFL Left eval Left 02/02/23 Left  03/10/23  Shoulder flexion  0(93) 0 (90) 0 (90)  Shoulder abduction  30(83) 35 (90)  40 (90)  Shoulder adduction      Shoulder extension  Shoulder internal rotation      Shoulder external rotation      Elbow flexion  0-87(0-122) 80   Elbow extension   -40   Wrist flexion  52 82   Wrist extension  10(50) 0 (58) 0 (70)  Wrist ulnar deviation      Wrist radial deviation      Wrist pronation      Wrist supination      (Blank rows = not tested)  Full Digit flexion to the [facility] Active digit extension through 75% of the range  grossly Active thumb radial, and palmar abduction  03/10/23: Active digit extension through full range grossly through digits 1-5  UPPER EXTREMITY MMT:     MMT Right eval Left eval Left 02/02/23 Left 03/10/23  Shoulder flexion  0/5 0 0  Shoulder abduction  2-/5 2- 2-  Shoulder adduction      Shoulder extension      Shoulder internal rotation      Shoulder external rotation      Middle trapezius      Lower trapezius      Elbow flexion  3/5 3- 3-  Elbow extension      Wrist flexion      Wrist extension  2/5 2 3-  Wrist ulnar deviation      Wrist radial deviation      Wrist pronation      Wrist supination      (Blank rows = not tested)  HAND FUNCTION: Grip strength: Right: 18 lbs; Left: 10 lbs, Lateral pinch: Right: 5 lbs, Left: 11 lbs, and 3 point pinch: Right: 1 lbs, Left: 15 lbs 02/02/23: Grip strength: Right: 8 lbs (limited by pain); Left: 25 lbs, Lateral pinch: Right: 5 lbs, Left: 12 lbs, and 3 point pinch: Right: 4 lbs, Left: 6 lbs 03/10/23: Grip strength: Right: 12 lbs (limited by pain); Left: 25 lbs, Lateral pinch: Right: 8 lbs, Left: 12 lbs, and 3 point pinch: Right: 7 lbs, Left: 5 lbs 03/28/23: Grip strength: Right: 19 lbs; Left: 25 lbs, Lateral pinch: Right: 8 lbs, Left: 11 lbs, and 3 point pinch: Right: 7 lbs, Left: 12 lbs  COORDINATION: Eval: N/A 02/02/23: 9 hole R: 42 sec; L: (unable) Able to pick up 1 peg and place in hole  03/10/23: R unable: Pt able to pick up 1 peg but unable to place in hole; pt can remove a peg from the hole  SENSATION: WFL  EDEMA: TBD  MUSCLE TONE: LUE: Hypotonic  COGNITION: Overall cognitive status: Within functional limits for tasks assessed  VISION: Subjective report:  Pt. Reports having an Eye appointment Baseline vision: Wears glasses all the time  VISION ASSESSMENT: To be further assessed in functional context  PRAXIS: Impaired: Motor planning  TODAY'S TREATMENT:    Self Care: Pt. Reported that she continues to use the walker  bag around the house. Pt. Worked on meal prep tasks including standing at the sink to wash hands, functional reaching into the 1st and 2nd shelves of cupboard using the R hand to remove bowls and cups from top and middle shelf. Pt. Worked on transferring bowl across the countertop to the microwave and opening microwave to simulate warming up food. Pt. Transferred bowl back to sink to simulate taking it to the sink to wash it. Pt. Worked on reaching into the refrigerator to simulate getting certain items out needed for light meal preparation. Pt. Worked on proper positioning of the walker when opening the refrigerator and dishwasher.  Pt. Used RW throughout meal prep tasks. Pt. Worked on problem solving through situational judgement and home safety awareness questions for home safety.   Therapeutic Exercise:  Pt. Worked on using L hand to grasp jumbo pegs and place them onto the jumbo peg board. Pt. Worked on removing the jumbo pegs from the peg board using the hand grip strengthener set to 6.6# for 1x trial.  Pt. Education details: Kitchen/Home safety Person educated: Patient Education method: explanation, vc, visual from internet Education comprehension: verbalized understanding, receptive to education   HOME EXERCISE PROGRAM: Towel squeezes grip strengthening, seated marches and knee extension (lifting legs over tub)  GOALS: Goals reviewed with patient? Yes  SHORT TERM GOALS: Target date: 03/31/23 (3 weeks)   Pt. Will demonstrate independence with HEPs for the LUE. Baseline: Eval: No current HEP; 02/02/23: Pt demos self passive stretching at the shoulder in a couple different planes (horiz add, flexion); pt has been encouraged to perform towel squeezes for grip strengthening, but pt reports she has not yet done this at home; 03/10/23: Pt verbalizes/demos understanding of table slides for LUE mobility and towel squeezes for L grip strengthening, though uncertain of frequency of participation at  home. Goal status: achieved  LONG TERM GOALS: Target date: 04/21/23 (6 weeks)  Pt. Will increase FOTO score by 2 points for Pt. perceived improvement with assessment specific ADL/IADL improvement. Baseline: Eval: FOTO 33, TR score: 39; 02/02/23: FOTO 44; 03/10/23: 40 03/28/23: 44 Goal status: achieved/ongoing  2.  Pt. Will improve left shoulder ROM by 10 degrees to assist with self-dressing. Baseline: Left shoulder flexion: 0(93), Abduction: 30(83); 02/02/23: L shoulder flexion 0 (90), abd 35 (90); 03/10/23: L shoulder flexion 0 (90), abd 40 (90) Goal status: Not met/discontinue  3.  Pt. Will improve left wrist extension by 10 in preparation for functional reaching Baseline: Left wrist extension: 10(50); 02/02/23: L wrist ext 0 (58); 03/10/23: L wrist ext active to neutral (passively to 70) Goal status: Not met/discontinue  4.  Pt. Will increase left grip strength to 30# or more in preparation for being able to securely stabilize ADL/IADL items at the tabletop. (revised from 5# on 02/02/23) Baseline: Left grip strength: 10#; 02/02/23: L 25#; 03/10/23: L grip 25# 6/25: L grip 25# Goal status: ongoing  5. Pt will perform tub transfer with leg lifter and transfer tub bench with supv/set up. Baseline: Min A with sit<>stand from tub bench, mod A to lift L leg over tub; 02/02/23: supv with sit<>stand from tub bench, min A to lift L leg over tub with use of leg lifter; 03/10/23: DIL helps to lift legs over tub if performing in the p.m. d/t fatigue end of day, in the a.m pt can use leg lifter to lift legs over tub with supv/set up while seated on transfer tub bench  Goal status: partially met; discontinue  6. Pt will perform UB bathing with min A.  Baseline: Mod A; 02/02/23: min A; 03/10/23: capable of performing with min A, but DIL typically does all the washing.  Encouraged pt to increase participation for maximizing overall strength, activity tolerance, and indep with ADLs.  Goal status: achieved  7. Pt will utilize  walker tray/bag to be able to transport food/drink items from refrigerator to table top with modified indep.   Baseline:03/28/2023: Continue 03/10/23: Family manages food/drink set up; walker bag issued this date and educ on walker tray options.  Goal status: Ongoing  8. Pt will perform simple cold and hot meal prep with distant  supv using RW and walker tray or bag while seated or while standing up to 5 min.    Baseline: Continue 03/10/23: Pt able to stand 5 min (mostly static) with RW; family manages all meal prep.   Goal status: Ongoing  ASSESSMENT: CLINICAL IMPRESSION:  Pt reported that she continues to use the walker bag when she is in the kitchen and has found it useful. Pt. Required min A for sit to stand from wc to supported standing at the countertop and min guard for balance with functional reaching and transporting items along the countertop. Pt. Required increased verbal cues when positioning the walker throughout meal preparation tasks. Pt. Required frequent rest breaks when standing and moving around the kitchen. Pt. Was able to identify unsafe situations on the home safety cards and explain in detail what she would do differently to ensure proper home safety.  Pt. Was able to grasp jumbo pegs with the L hand however often required the R hand to help turn/manipulate the jumbo peg to fit into the peg board. Pt. Was able to place 13 pegs onto peg board however required increased time and verbal cues. Pt. Was able to grasp onto hand grip strengthener, however required hand over hand assistance to help guide L arm to pick up the jumbo pegs due to limited ROM in LUE. Pt. Would continue to benefit from skilled OT services to increase engagement and independence in self care and home management tasks.  PERFORMANCE DEFICITS: in functional skills including ADLs, IADLs, coordination, dexterity, ROM, strength, pain, Fine motor control, and Gross motor control, cognitive skills including attention, problem  solving, and safety awareness, and psychosocial skills including coping strategies, environmental adaptation, and routines and behaviors.   IMPAIRMENTS: are limiting patient from ADLs, IADLs, leisure, and social participation.   CO-MORBIDITIES: may have co-morbidities  that affects occupational performance. Patient will benefit from skilled OT to address above impairments and improve overall function.  MODIFICATION OR ASSISTANCE TO COMPLETE EVALUATION: Maximum or significant modification of tasks or assist is necessary to complete an evaluation.  OT OCCUPATIONAL PROFILE AND HISTORY: Comprehensive assessment: Review of records and extensive additional review of physical, cognitive, psychosocial history related to current functional performance.  CLINICAL DECISION MAKING: High - multiple treatment options, significant modification of task necessary  REHAB POTENTIAL: Good  EVALUATION COMPLEXITY: High  PLAN:  OT FREQUENCY: 1x/week  OT DURATION: 6 weeks  PLANNED INTERVENTIONS: self care/ADL training, therapeutic exercise, therapeutic activity, neuromuscular re-education, manual therapy, passive range of motion, functional mobility training, electrical stimulation, and paraffin  RECOMMENDED OTHER SERVICES: OT  CONSULTED AND AGREED WITH PLAN OF CARE: Patient  PLAN FOR NEXT SESSION: therapeutic exercises, ADL/AE training   Herma Carson, OTS 9:11 AM 04/11/2023  This entire session was performed under the direct supervision and direction of a licensed therapist. I have personally read, edited, and approve of the note as written.   Olegario Messier, MS, OTR/L   04/11/2023

## 2023-04-11 NOTE — Therapy (Signed)
OUTPATIENT PHYSICAL THERAPY NEURO TREATMENT NOTE    Patient Name: Dana Lucas MRN: [id number] DOB:1950/11/01, 72 y.o., female Today's Date: 04/07/2023   PCP: Bobbye Morton MD REFERRING PROVIDER: Louis Matte MD   END OF SESSION:  PT End of Session - 04/07/23 0813     Visit Number 24    Number of Visits 38    Date for PT Re-Evaluation 05/24/23    Authorization Type UHC Medicare; [place] Medicaid    Authorization Time Period 03/01/23-05/24/23    Progress Note Due on Visit 30    PT Start Time 0815    PT Stop Time 0859    PT Time Calculation (min) 44 min    Equipment Utilized During Treatment Gait belt    Activity Tolerance Patient tolerated treatment well;Patient limited by fatigue (P)     Behavior During Therapy WFL for tasks assessed/performed                      Past Medical History:  Diagnosis Date   Diabetes mellitus without complication (HCC)    Hypertension    Stroke [facility])    Past Surgical History:  Procedure Laterality Date   ABDOMINAL HYSTERECTOMY     COLONOSCOPY N/A 10/06/2019   Procedure: COLONOSCOPY;  Surgeon: Toledo, Boykin Nearing, MD;  Location: [facility] ENDOSCOPY;  Service: Gastroenterology;  Laterality: N/A;   Patient Active Problem List   Diagnosis Date Noted   Hypertension    Diabetes mellitus without complication (HCC)    Stroke (HCC)    GIB (gastrointestinal bleeding)    AKI (acute kidney injury) (HCC)    UTI (urinary tract infection)     ONSET DATE: 2020  REFERRING DIAG: 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   THERAPY DIAG:  Muscle weakness (generalized)  Difficulty in walking, not elsewhere classified  Other lack of coordination  Unsteadiness on feet  Rationale for Evaluation and Treatment: Rehabilitation  SUBJECTIVE:                                                                                                                                                                                              SUBJECTIVE STATEMENT:   Pt doing well, she reports no pain currently and no falls or other updates.  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 Stroke was four years ago, has had multiple strokes. Had a little bit of therapy, afterwards in [place]. Moved up to [place] about three years ago. [name] includes DM with diabetic neuropathy,  stroke, HTN, AKI, UTI. Uses a walker to walk to the bathroom (two wheel and three wheel walkers).  Has a power chair for out of the house.   PAIN:  Are you having pain? Yes Right hand    PRECAUTIONS: Fall  WEIGHT BEARING RESTRICTIONS: No  FALLS: Has patient fallen in last 6 months? No  LIVING ENVIRONMENT: Lives with: lives with their family Lives in: House/apartment Stairs:  ramp outside Has following equipment at home: [name] - 2 wheeled, Environmental consultant - 4 wheeled, Wheelchair (power), shower chair, Ramped entry, and chair lift and hospital bed   PLOF: Independent  PATIENT GOALS: get up and walk by herself, wants to walk with a cane.   OBJECTIVE:   DIAGNOSTIC FINDINGS: CVA in [place]; no imaging in system  COGNITION: Overall cognitive status:  a little bit per granddaughter.    SENSATION: WFL  COORDINATION: Limited LLE     POSTURE: rounded shoulders, forward head, flexed trunk , and weight shift right    LOWER EXTREMITY MMT:    MMT Right Eval Left Eval  Hip flexion 4 2  Hip extension    Hip abduction 4 2+  Hip adduction 4 2+  Knee flexion 4 2-  Knee extension 4 2+  Ankle dorsiflexion 3   Ankle plantarflexion 3   (Blank rows = not tested)   TRANSFERS: Assistive device utilized: Environmental consultant - 2 wheeled  Sit to stand: CGA and Min A; blocking of L foot  Stand to sit: CGA Chair to chair: Min A    GAIT: Gait pattern: step to pattern, decreased stance time- Left, decreased ankle dorsiflexion- Left, and poor foot  clearance- Left Distance walked: 59ft Assistive device utilized: Environmental consultant - 2 wheeled Level of assistance: CGA Comments: L knee hyperextension with weightbearing  FUNCTIONAL TESTS:  5 times sit to stand: 38 seconds with SUE support  10 meter walk test: 1 min 43 seconds with RW  PATIENT SURVEYS:  FOTO 46  TODAY'S TREATMENT: DATE: 04/07/23 Unless otherwise stated, close CGA was provided and gait belt donned in order to ensure pt safety  TE: STS 1x from powerchair to RW; ambulation x 8 ft to nustep chair.  Nustep interval training (pt buckled in). PT adjusts intensity throughout and monitors pt for response to maintain therapeutic range. Min assist provided for LE/UE positioning in chair and mount/dismount.  Lvl 1 x 2 min Lvl 2 x 1 min Lvl 3 x 1 min - pt rates easy  Lvl 5 x 1 min - Pt rates challenging  Lvl 4 x 1 min Lvl 3 x 1 min Lvl 1 x 1 min with cool-down minute. Pt maintains SPM in 60s-70s  Amb approximately 1x 100 ft, 3x 60-70 ft with RW   STS 4x5    PATIENT EDUCATION: Education details: Pt educated throughout session about proper posture and technique with exercises. Improved exercise technique, movement at target joints, use of target muscles after min to mod verbal, visual, tactile cues.   Person educated: Patient and daughter [name] Education method: Explanation, Demonstration, Tactile cues, and Verbal cues Education comprehension: verbalized understanding, returned demonstration, verbal cues required, tactile cues required, and needs further education  HOME EXERCISE PROGRAM: Access Code: [email or web address] URL: [email or web address] Date: 12/06/2022 Prepared by: Precious Bard  Exercises - Leg Extension  - 1 x daily - 7 x weekly - 2 sets - 10 reps - 5 hold - Seated March  - 1 x daily - 7 x weekly - 2 sets - 10 reps - 5 hold -  Seated Hip Abduction  - 1 x daily - 7 x weekly - 2 sets - 10 reps - 5 hold - Seated Hip Adduction Isometrics with Ball  - 1 x  daily - 7 x weekly - 2 sets - 10 reps - 5 hold - Seated Weight Shifting Without Arm Support  - 1 x daily - 7 x weekly - 2 sets - 10 reps - 5 hold   GOALS: Goals reviewed with patient? Yes  SHORT TERM GOALS: Target date: 01/03/2023    Patient will be independent in home exercise program to improve strength/mobility for better functional independence with ADLs. Baseline: 01/26/2023: pt has some difficulty with HEP Goal status: IN PROGRESS    LONG TERM GOALS: Target date: 02/28/2023    Patient will increase FOTO score to equal to or greater than 53    to demonstrate statistically significant improvement in mobility and quality of life.  Baseline: 3/5: 46%; 01/26/23: 45% ; 03/01/23: 42%  Goal status: IN PROGRESS  2.  Patient (> 18 years old) will complete five times sit to stand test in < 15 seconds indicating an increased LE strength and improved balance. Baseline: 3/5: 38 seconds with SUE support; blocking of LLE; 01/26/23 18 seconds with pulling to stand; 03/01/23: 17 seconds pull to stand  Goal status: Partially MET  3.  Patient will increase 10 meter walk test to >1.41m/s as to improve gait speed for better community ambulation and to reduce fall risk Baseline: 3/5: 1 min 43 seconds with RW and w/c follow; 01/26/2023: 2 min 6 seconds; 03/01/23: 1 min 11 sec, 0.14 m/s with WC follow and 2WW  Goal status: IN PROGRESS  4.  Patient will increase BLE gross strength to 4/5 as to improve functional strength for independent gait, increased standing tolerance and increased ADL ability. Baseline: 3/5: grossly 2/5 LLE; 4//25/24: RLE grossly 4-/5, LLE grossly 2/5; 02/28/22 RLE grossly 4+/5 and LLE grossly 3+/5, exception 0/5 PF/DF (overall gross Bilat LE strength around 4-/5) Goal status: PARTIALLY MET    ASSESSMENT:  CLINICAL IMPRESSION:  Session somewhat limited d/t pt bathroom break at beginning. Pt highly motivated to participate in PT.  Pt continues to exhibit progress by performing multiple  bouts of ambulation with RW immediately followed by STS training. Pt also able to progress in level of intensity on nustep.  While pt shows progress, she still has not attained therapy goals and exhibits numerous impairments of gait mechanics. The pt will continue to benefit from skilled therapy to address remaining deficits in order to improve overall QoL and return to PLOF.     OBJECTIVE IMPAIRMENTS: Abnormal gait, cardiopulmonary status limiting activity, decreased activity tolerance, decreased balance, decreased coordination, decreased endurance, decreased knowledge of use of DME, decreased mobility, difficulty walking, decreased ROM, decreased strength, impaired perceived functional ability, impaired flexibility, impaired UE functional use, improper body mechanics, postural dysfunction, obesity, and pain.   ACTIVITY LIMITATIONS: carrying, lifting, bending, sitting, standing, squatting, sleeping, stairs, transfers, bed mobility, bathing, toileting, dressing, reach over head, and caring for others  PARTICIPATION LIMITATIONS: meal prep, cleaning, laundry, interpersonal relationship, driving, shopping, community activity, and church  PERSONAL FACTORS: Age, Behavior pattern, Education, Fitness, Past/current experiences, Profession, Sex, Time since onset of injury/illness/exacerbation, and 3+ comorbidities: DM with diabetic neuropathy, stroke, HTN, AKI, UTI  are also affecting patient's functional outcome.   REHAB POTENTIAL: Fair length of time since CVA  CLINICAL DECISION MAKING: Evolving/moderate complexity  EVALUATION COMPLEXITY: Moderate  PLAN:  PT FREQUENCY: 2x/week  PT DURATION:  12 weeks  PLANNED INTERVENTIONS: Therapeutic exercises, Therapeutic activity, Neuromuscular re-education, Balance training, Gait training, Patient/Family education, Self Care, Joint mobilization, Stair training, Vestibular training, Canalith repositioning, Visual/preceptual remediation/compensation, Orthotic/Fit  training, DME instructions, Cognitive remediation, Electrical stimulation, Wheelchair mobility training, Spinal mobilization, Cryotherapy, Moist heat, Splintting, Taping, Traction, Ultrasound, Manual therapy, and Re-evaluation  PLAN FOR NEXT SESSION: stand in // bars, work on walking, strengthening LLE, STS  Temple Pacini PT, DPT  Physical Therapist- [facility]  [facility]   04/07/23, 9:23 AM

## 2023-04-12 ENCOUNTER — Encounter: Payer: 59 | Admitting: Occupational Therapy

## 2023-04-12 ENCOUNTER — Ambulatory Visit: Payer: 59

## 2023-04-13 NOTE — Therapy (Signed)
OUTPATIENT PHYSICAL THERAPY NEURO TREATMENT NOTE    Patient Name: Dana Lucas MRN: [id number] DOB:[birth year], 72 y.o., female Today's Date: 04/14/2023   PCP: Bobbye Morton MD REFERRING PROVIDER: Louis Matte MD   END OF SESSION:  PT End of Session - 04/14/23 0753     Visit Number 26    Number of Visits 38    Date for PT Re-Evaluation 05/24/23    Authorization Type UHC Medicare; [place] Medicaid    Authorization Time Period 03/01/23-05/24/23    Progress Note Due on Visit 30    PT Start Time 0753    PT Stop Time 0844    PT Time Calculation (min) 51 min    Equipment Utilized During Treatment Gait belt    Activity Tolerance Patient tolerated treatment well;Patient limited by fatigue    Behavior During Therapy WFL for tasks assessed/performed                       Past Medical History:  Diagnosis Date   Diabetes mellitus without complication (HCC)    Hypertension    Stroke [facility])    Past Surgical History:  Procedure Laterality Date   ABDOMINAL HYSTERECTOMY     COLONOSCOPY N/A 10/06/2019   Procedure: COLONOSCOPY;  Surgeon: Toledo, Boykin Nearing, MD;  Location: [facility] ENDOSCOPY;  Service: Gastroenterology;  Laterality: N/A;   Patient Active Problem List   Diagnosis Date Noted   Hypertension    Diabetes mellitus without complication (HCC)    Stroke (HCC)    GIB (gastrointestinal bleeding)    AKI (acute kidney injury) (HCC)    UTI (urinary tract infection)     ONSET DATE: 2020  REFERRING DIAG: 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   THERAPY DIAG:  Muscle weakness (generalized)  Difficulty in walking, not elsewhere classified  Unsteadiness on feet  Other lack of coordination  Chronic pain of right knee  Rationale for Evaluation and Treatment: Rehabilitation  SUBJECTIVE:                                                                                                                                                                                              SUBJECTIVE STATEMENT:   Pt reports having a good week- no falls. States she wants to work on her left LE strength  Pt accompanied by: self  PERTINENT HISTORY:   Patient presents for 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 Stroke was four years ago, has had multiple strokes. Had a little bit of therapy, afterwards in [place]. Moved up to [place] about three  years ago. [name] includes DM with diabetic neuropathy, stroke, HTN, AKI, UTI. Uses a walker to walk to the bathroom (two wheel and three wheel walkers).  Has a power chair for out of the house.   PAIN:  Are you having pain? Yes Right hand    PRECAUTIONS: Fall  WEIGHT BEARING RESTRICTIONS: No  FALLS: Has patient fallen in last 6 months? No  LIVING ENVIRONMENT: Lives with: lives with their family Lives in: House/apartment Stairs:  ramp outside Has following equipment at home: [name] - 2 wheeled, Environmental consultant - 4 wheeled, Wheelchair (power), shower chair, Ramped entry, and chair lift and hospital bed   PLOF: Independent  PATIENT GOALS: get up and walk by herself, wants to walk with a cane.   OBJECTIVE:   DIAGNOSTIC FINDINGS: CVA in [place]; no imaging in system  COGNITION: Overall cognitive status:  a little bit per granddaughter.    SENSATION: WFL  COORDINATION: Limited LLE     POSTURE: rounded shoulders, forward head, flexed trunk , and weight shift right    LOWER EXTREMITY MMT:    MMT Right Eval Left Eval  Hip flexion 4 2  Hip extension    Hip abduction 4 2+  Hip adduction 4 2+  Knee flexion 4 2-  Knee extension 4 2+  Ankle dorsiflexion 3   Ankle plantarflexion 3   (Blank rows = not tested)   TRANSFERS: Assistive device utilized: Environmental consultant - 2 wheeled  Sit to stand: CGA and Min A; blocking of L foot  Stand to sit: CGA Chair to chair: Min A    GAIT: Gait pattern: step to pattern, decreased stance time- Left, decreased  ankle dorsiflexion- Left, and poor foot clearance- Left Distance walked: 54ft Assistive device utilized: Environmental consultant - 2 wheeled Level of assistance: CGA Comments: L knee hyperextension with weightbearing  FUNCTIONAL TESTS:  5 times sit to stand: 38 seconds with SUE support  10 meter walk test: 1 min 43 seconds with RW  PATIENT SURVEYS:  FOTO 46  TODAY'S TREATMENT: DATE: 04/14/23 Unless otherwise stated, close CGA was provided and gait belt donned in order to ensure pt safety  TE: STS 1x from powerchair to RW; ambulation x 8 ft to nustep chair.  Nustep (interval training) PT adjusts intensity throughout and monitors pt for response to maintain therapeutic range. Min assist provided for LE/UE positioning in chair and mount/dismount.  Lvl 1 x 3 min Lvl 3 x 1 min Lvl 5 x 1 min - pt rates easy  Lvl 3 x 1 min - Pt rates challenging  Lvl 5 x 30 sec Lvl 1 x 30 sec Pt maintains SPM in 60s-80s Total distance= 97%; HR=72   LE Strengthening- Pyramid for LLE  and AROM on Right LE  10 reps - AROM each LE 8 reps- 1# AW LLE; 10 reps 1# RLE 6 resp- 2# AW LLE; 8 reps 2# RLE 4 reps 3# AW LLE; 6 reps 2# RLE  Seated Hip march LLE- AROM 3 sets of 10 reps   Amb in clinic with RW, CGA- 160 feet today- short reciprocal steps and no rest.     PATIENT EDUCATION: Education details: Pt educated throughout session about proper posture and technique with exercises. Improved exercise technique, movement at target joints, use of target muscles after min to mod verbal, visual, tactile cues.   Person educated: Patient and daughter [name] Education method: Explanation, Demonstration, Tactile cues, and Verbal cues Education comprehension: verbalized understanding, returned demonstration, verbal cues required, tactile cues required,  and needs further education  HOME EXERCISE PROGRAM: Access Code: [email or web address] URL: [email or web address] Date: 12/06/2022 Prepared by: Precious Bard  Exercises - Leg Extension  - 1 x daily - 7 x weekly - 2 sets - 10 reps - 5 hold - Seated March  - 1 x daily - 7 x weekly - 2 sets - 10 reps - 5 hold - Seated Hip Abduction  - 1 x daily - 7 x weekly - 2 sets - 10 reps - 5 hold - Seated Hip Adduction Isometrics with Ball  - 1 x daily - 7 x weekly - 2 sets - 10 reps - 5 hold - Seated Weight Shifting Without Arm Support  - 1 x daily - 7 x weekly - 2 sets - 10 reps - 5 hold   GOALS: Goals reviewed with patient? Yes  SHORT TERM GOALS: Target date: 01/03/2023    Patient will be independent in home exercise program to improve strength/mobility for better functional independence with ADLs. Baseline: 01/26/2023: pt has some difficulty with HEP Goal status: IN PROGRESS    LONG TERM GOALS: Target date: 02/28/2023    Patient will increase FOTO score to equal to or greater than 53    to demonstrate statistically significant improvement in mobility and quality of life.  Baseline: 3/5: 46%; 01/26/23: 45% ; 03/01/23: 42%  Goal status: IN PROGRESS  2.  Patient (> 43 years old) will complete five times sit to stand test in < 15 seconds indicating an increased LE strength and improved balance. Baseline: 3/5: 38 seconds with SUE support; blocking of LLE; 01/26/23 18 seconds with pulling to stand; 03/01/23: 17 seconds pull to stand  Goal status: Partially MET  3.  Patient will increase 10 meter walk test to >1.52m/s as to improve gait speed for better community ambulation and to reduce fall risk Baseline: 3/5: 1 min 43 seconds with RW and w/c follow; 01/26/2023: 2 min 6 seconds; 03/01/23: 1 min 11 sec, 0.14 m/s with WC follow and 2WW  Goal status: IN PROGRESS  4.  Patient will increase BLE gross strength to 4/5 as to improve functional strength for independent gait, increased standing tolerance and increased ADL ability. Baseline: 3/5: grossly 2/5 LLE; 4//25/24: RLE grossly 4-/5, LLE grossly 2/5; 02/28/22 RLE grossly 4+/5 and LLE grossly 3+/5, exception  0/5 PF/DF (overall gross Bilat LE strength around 4-/5) Goal status: PARTIALLY MET    ASSESSMENT:  CLINICAL IMPRESSION:  Patient presents with good motivation for today's session. She demonstrated good participation with interval Nustep and able to push herself to improve with resistance. She was later instructed in pyramid style training to improve her power/strength in left LE and responded appropriately- as resistance increased she reduced the reps yet able to complete today. She lastly finished with a full lap in gym of walking with walking demonstrating improving  gait function despite fatigue. The pt will continue to benefit from skilled therapy to address remaining deficits in order to improve overall QoL and return to PLOF.     OBJECTIVE IMPAIRMENTS: Abnormal gait, cardiopulmonary status limiting activity, decreased activity tolerance, decreased balance, decreased coordination, decreased endurance, decreased knowledge of use of DME, decreased mobility, difficulty walking, decreased ROM, decreased strength, impaired perceived functional ability, impaired flexibility, impaired UE functional use, improper body mechanics, postural dysfunction, obesity, and pain.   ACTIVITY LIMITATIONS: carrying, lifting, bending, sitting, standing, squatting, sleeping, stairs, transfers, bed mobility, bathing, toileting, dressing, reach over head, and caring for others  PARTICIPATION LIMITATIONS: meal prep, cleaning,  laundry, interpersonal relationship, driving, shopping, community activity, and church  PERSONAL FACTORS: Age, Behavior pattern, Education, Fitness, Past/current experiences, Profession, Sex, Time since onset of injury/illness/exacerbation, and 3+ comorbidities: DM with diabetic neuropathy, stroke, HTN, AKI, UTI  are also affecting patient's functional outcome.   REHAB POTENTIAL: Fair length of time since CVA  CLINICAL DECISION MAKING: Evolving/moderate complexity  EVALUATION COMPLEXITY:  Moderate  PLAN:  PT FREQUENCY: 2x/week  PT DURATION: 12 weeks  PLANNED INTERVENTIONS: Therapeutic exercises, Therapeutic activity, Neuromuscular re-education, Balance training, Gait training, Patient/Family education, Self Care, Joint mobilization, Stair training, Vestibular training, Canalith repositioning, Visual/preceptual remediation/compensation, Orthotic/Fit training, DME instructions, Cognitive remediation, Electrical stimulation, Wheelchair mobility training, Spinal mobilization, Cryotherapy, Moist heat, Splintting, Taping, Traction, Ultrasound, Manual therapy, and Re-evaluation  PLAN FOR NEXT SESSION: stand in // bars, work on walking, strengthening LLE, STS  Louis Meckel, PT Physical Therapist- [place]  [facility]   04/14/23, 11:42 AM

## 2023-04-14 ENCOUNTER — Ambulatory Visit: Payer: 59

## 2023-04-14 DIAGNOSIS — R2681 Unsteadiness on feet: Secondary | ICD-10-CM

## 2023-04-14 DIAGNOSIS — M6281 Muscle weakness (generalized): Secondary | ICD-10-CM

## 2023-04-14 DIAGNOSIS — G8929 Other chronic pain: Secondary | ICD-10-CM

## 2023-04-14 DIAGNOSIS — R278 Other lack of coordination: Secondary | ICD-10-CM

## 2023-04-14 DIAGNOSIS — R262 Difficulty in walking, not elsewhere classified: Secondary | ICD-10-CM

## 2023-04-17 ENCOUNTER — Ambulatory Visit: Payer: 59

## 2023-04-17 DIAGNOSIS — M6281 Muscle weakness (generalized): Secondary | ICD-10-CM

## 2023-04-17 DIAGNOSIS — R2681 Unsteadiness on feet: Secondary | ICD-10-CM

## 2023-04-17 DIAGNOSIS — R278 Other lack of coordination: Secondary | ICD-10-CM

## 2023-04-17 DIAGNOSIS — R262 Difficulty in walking, not elsewhere classified: Secondary | ICD-10-CM

## 2023-04-17 NOTE — Therapy (Signed)
Occupational Therapy Neuro Treatment Note  Patient Name: Dana Lucas MRN: [id number] DOB:Jun 13, 1951, 72 y.o., female  PCP: Hinda Lenis, MD REFERRING PROVIDER: Sumner Boast, MD  END OF SESSION:  OT End of Session - 04/17/23 0809     Visit Number 23    Number of Visits 24    Date for OT Re-Evaluation 04/21/23    Authorization Type Progress report period starting 02/02/23    Progress Note Due on Visit 10    OT Start Time 0806    OT Stop Time 0845    OT Time Calculation (min) 39 min    Equipment Utilized During Treatment power wc    Activity Tolerance Patient tolerated treatment well    Behavior During Therapy WFL for tasks assessed/performed             Past Medical History:  Diagnosis Date   Diabetes mellitus without complication (HCC)    Hypertension    Stroke [facility])    Past Surgical History:  Procedure Laterality Date   ABDOMINAL HYSTERECTOMY     COLONOSCOPY N/A 10/06/2019   Procedure: COLONOSCOPY;  Surgeon: Toledo, Boykin Nearing, MD;  Location: [facility] ENDOSCOPY;  Service: Gastroenterology;  Laterality: N/A;   Patient Active Problem List   Diagnosis Date Noted   Hypertension    Diabetes mellitus without complication (HCC)    Stroke (HCC)    GIB (gastrointestinal bleeding)    AKI (acute kidney injury) (HCC)    UTI (urinary tract infection)    ONSET DATE: 05/2014  REFERRING DIAG: CVA  THERAPY DIAG:  Muscle weakness (generalized)  Other lack of coordination  Rationale for Evaluation and Treatment: Rehabilitation  SUBJECTIVE:  SUBJECTIVE STATEMENT: Pt. Reports that she is doing well today and that she had a good relaxing weekend. Pt accompanied by: family member , DIL [name])  PERTINENT HISTORY: Pt. is a 72 y.o. female who has a history of multiple CVAs beginning in 2015. Pt. 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   PRECAUTIONS: None  WEIGHT BEARING RESTRICTIONS: No  PAIN: 04/17/23: 2/10 R hand Are you having  pain? Pt. Reported that she is not in any pain today FALLS: Has patient fallen in last 6 months? No  LIVING ENVIRONMENT: Lives with: lives with their family and lives with their son Lives in: House/apartment Stairs: one story, ramped entrance Has following equipment at home: Wheelchair (power) and Wheelchair (manual), 2 wheeled walker, 3 wheeled walker, power lift, hospital bed, transfer shower bench  PLOF: Independent  PATIENT GOALS: To be able to walk again, and to drive  OBJECTIVE:   HAND DOMINANCE: Right  ADLs: Transfers/ambulation related to ADLs: Eating: Independent Grooming: Independent UB Dressing: Independent LB Dressing: Independent pants, MaxA shoes, and socks Toileting: Assist with transfers, reports independence with toileting care Bathing: Assist from DIL Tub Shower transfers: Min-ModA shower transfers Equipment: See above  IADLs: Shopping: DIL performs shopping Light housekeeping: Unable Meal Prep: Pt. Is able to retrieve a beverage, or snack, light meal prep Community mobility: Power w/c Medication management: Grand daughter assists with pillbox set-up.  Pt. Independently takes medication Financial management: No changes Handwriting: Name: 100% legible in printed form, 75% in cursive form  MOBILITY STATUS: Needs Assist: Uses power w/c  POSTURE COMMENTS:  Sitting balance: WFL supported  FUNCTIONAL OUTCOME MEASURES: FOTO: 33 TR:39 02/02/23: 44 03/10/23: 40 03/28/23: 44 TR: 39 04/17/23: 41  UPPER EXTREMITY ROM:    Active ROM Right WFL Left eval Left 02/02/23 Left  03/10/23 Left 04/17/23  Shoulder flexion  0(93) 0 (90) 0 (90) 0 (90)  Shoulder abduction  30(83) 35 (90)  40 (90) 40 (90)  Shoulder adduction       Shoulder extension       Shoulder internal rotation       Shoulder external rotation       Elbow flexion  0-87(0-122) 80    Elbow extension   -40    Wrist flexion  52 82    Wrist extension  10(50) 0 (58) 0 (70) 0 (70)  Wrist ulnar deviation        Wrist radial deviation       Wrist pronation       Wrist supination       (Blank rows = not tested)  Full Digit flexion to the [facility] Active digit extension through 75% of the range grossly Active thumb radial, and palmar abduction  03/10/23: Active digit extension through full range grossly through digits 1-5  UPPER EXTREMITY MMT:     MMT Right eval Left eval Left 02/02/23 Left 03/10/23  Shoulder flexion  0/5 0 0  Shoulder abduction  2-/5 2- 2-  Shoulder adduction      Shoulder extension      Shoulder internal rotation      Shoulder external rotation      Middle trapezius      Lower trapezius      Elbow flexion  3/5 3- 3-  Elbow extension      Wrist flexion      Wrist extension  2/5 2 3-  Wrist ulnar deviation      Wrist radial deviation      Wrist pronation      Wrist supination      (Blank rows = not tested)  HAND FUNCTION: Grip strength: Right: 18 lbs; Left: 10 lbs, Lateral pinch: Right: 5 lbs, Left: 11 lbs, and 3 point pinch: Right: 1 lbs, Left: 15 lbs 02/02/23: Grip strength: Right: 8 lbs (limited by pain); Left: 25 lbs, Lateral pinch: Right: 5 lbs, Left: 12 lbs, and 3 point pinch: Right: 4 lbs, Left: 6 lbs 03/10/23: Grip strength: Right: 12 lbs (limited by pain); Left: 25 lbs, Lateral pinch: Right: 8 lbs, Left: 12 lbs, and 3 point pinch: Right: 7 lbs, Left: 5 lbs 03/28/23: Grip strength: Right: 19 lbs; Left: 25 lbs, Lateral pinch: Right: 8 lbs, Left: 11 lbs, and 3 point pinch: Right: 7 lbs, Left: 12 lbs 04/17/23: Grip strength: Right: 24 lbs, Left: 21; Lateral pinch: Right: 6 lbs, Left: 6 lbs (thumb slips); 3 point pinch: Right: 6 lbs, Left: 6 lbs  COORDINATION: Eval: N/A 02/02/23: 9 hole R: 42 sec; L: (unable) Able to pick up 1 peg and place in hole  03/10/23: R unable: Pt able to pick up 1 peg but unable to place in hole; pt can remove a peg from the hole  SENSATION: WFL  EDEMA: TBD  MUSCLE TONE: LUE: Hypotonic  COGNITION: Overall cognitive status: Within functional  limits for tasks assessed  VISION: Subjective report:  Pt. Reports having an Eye appointment Baseline vision: Wears glasses all the time  VISION ASSESSMENT: To be further assessed in functional context  PRAXIS: Impaired: Motor planning  TODAY'S TREATMENT:    Self Care: Pt. Reported that she continues to use the walker bag around the house. Pt. Worked on meal prep tasks including standing at the sink to wash hands, functional reaching into the 1st and 2nd shelves of cupboard using the R hand to remove bowls  and cups from top and middle shelf. Pt. Worked on transferring bowl across the countertop to the microwave and opening microwave to simulate warming up food. Pt. Transferred bowl back to sink to simulate taking it to the sink to wash it. Pt. Worked on reaching into the refrigerator to simulate getting certain items out needed for light meal preparation. Pt. Worked on proper positioning of the walker when opening the refrigerator and dishwasher. Pt. Used RW throughout meal prep tasks. Pt. Worked on problem solving through situational judgement and home safety awareness questions for home safety.   Therapeutic Exercise:  Pt. Worked on using L hand to grasp jumbo pegs and place them onto the jumbo peg board. Pt. Worked on removing the jumbo pegs from the peg board using the hand grip strengthener set to 6.6# for 1x trial.  Pt. Education details: Kitchen/Home safety Person educated: Patient Education method: explanation, vc, visual from internet Education comprehension: verbalized understanding, receptive to education   HOME EXERCISE PROGRAM: Towel squeezes grip strengthening, seated marches and knee extension (lifting legs over tub)  GOALS: Goals reviewed with patient? Yes  SHORT TERM GOALS: Target date: 03/31/23 (3 weeks)   Pt. Will demonstrate independence with HEPs for the LUE. Baseline: Eval: No current HEP; 02/02/23: Pt demos self passive stretching at the shoulder in a couple  different planes (horiz add, flexion); pt has been encouraged to perform towel squeezes for grip strengthening, but pt reports she has not yet done this at home; 03/10/23: Pt verbalizes/demos understanding of table slides for LUE mobility and towel squeezes for L grip strengthening, though uncertain of frequency of participation at home. Goal status: achieved  LONG TERM GOALS: Target date: 04/21/23 (6 weeks)  Pt. Will increase FOTO score by 2 points for Pt. perceived improvement with assessment specific ADL/IADL improvement. Baseline: Eval: FOTO 33, TR score: 39; 02/02/23: FOTO 44; 03/10/23: 40 03/28/23: 44; 04/16/24: 41 Goal status: achieved/ongoing  2.  Pt. Will improve left shoulder ROM by 10 degrees to assist with self-dressing. Baseline: Left shoulder flexion: 0(93), Abduction: 30(83); 02/02/23: L shoulder flexion 0 (90), abd 35 (90); 03/10/23: L shoulder flexion 0 (90), abd 40 (90) Goal status: Not met/discontinue  3.  Pt. Will improve left wrist extension by 10 in preparation for functional reaching Baseline: Left wrist extension: 10(50); 02/02/23: L wrist ext 0 (58); 03/10/23: L wrist ext active to neutral (passively to 70) Goal status: Not met/discontinue  4.  Pt. Will increase left grip strength to 30# or more in preparation for being able to securely stabilize ADL/IADL items at the tabletop. (revised from 5# on 02/02/23) Baseline: Left grip strength: 10#; 02/02/23: L 25#; 03/10/23: L grip 25# 6/25: L grip 25#; 04/17/23: L grip 25# Goal status: Improved/partially met/discontinue  5. Pt will perform tub transfer with leg lifter and transfer tub bench with supv/set up. Baseline: Min A with sit<>stand from tub bench, mod A to lift L leg over tub; 02/02/23: supv with sit<>stand from tub bench, min A to lift L leg over tub with use of leg lifter; 03/10/23: DIL helps to lift legs over tub if performing in the p.m. d/t fatigue end of day, in the a.m pt can use leg lifter to lift legs over tub with supv/set up while  seated on transfer tub bench  Goal status: partially met; discontinue  6. Pt will perform UB bathing with min A.  Baseline: Mod A; 02/02/23: min A; 03/10/23: capable of performing with min A, but DIL typically does all the  washing.  Encouraged pt to increase participation for maximizing overall strength, activity tolerance, and indep with ADLs.  Goal status: achieved  7. Pt will utilize walker tray/bag to be able to transport food/drink items from refrigerator to table top with modified indep.   Baseline:03/28/2023: Continue 03/10/23: Family manages food/drink set up; walker bag issued this date and educ on walker tray options; 04/17/23: Pt reports she uses her bag to carry her phone to the bathroom.  Goal status: Ongoing  8. Pt will perform simple cold and hot meal prep with distant supv using RW and walker tray or bag while seated or while standing up to 5 min.    Baseline: Continue 03/10/23: Pt able to stand 5 min (mostly static) with RW; family manages all meal prep; pt has started using scissors to cut broccoli, has cut a cucumber    Goal status: Ongoing  ASSESSMENT: CLINICAL IMPRESSION:  Pt reported that she continues to use the walker bag when she is in the kitchen and has found it useful. Pt. Required min A for sit to stand from wc to supported standing at the countertop and min guard for balance with functional reaching and transporting items along the countertop. Pt. Required increased verbal cues when positioning the walker throughout meal preparation tasks. Pt. Required frequent rest breaks when standing and moving around the kitchen. Pt. Was able to identify unsafe situations on the home safety cards and explain in detail what she would do differently to ensure proper home safety.  Pt. Was able to grasp jumbo pegs with the L hand however often required the R hand to help turn/manipulate the jumbo peg to fit into the peg board. Pt. Was able to place 13 pegs onto peg board however required  increased time and verbal cues. Pt. Was able to grasp onto hand grip strengthener, however required hand over hand assistance to help guide L arm to pick up the jumbo pegs due to limited ROM in LUE. Pt. Would continue to benefit from skilled OT services to increase engagement and independence in self care and home management tasks.  PERFORMANCE DEFICITS: in functional skills including ADLs, IADLs, coordination, dexterity, ROM, strength, pain, Fine motor control, and Gross motor control, cognitive skills including attention, problem solving, and safety awareness, and psychosocial skills including coping strategies, environmental adaptation, and routines and behaviors.   IMPAIRMENTS: are limiting patient from ADLs, IADLs, leisure, and social participation.   CO-MORBIDITIES: may have co-morbidities  that affects occupational performance. Patient will benefit from skilled OT to address above impairments and improve overall function.  MODIFICATION OR ASSISTANCE TO COMPLETE EVALUATION: Maximum or significant modification of tasks or assist is necessary to complete an evaluation.  OT OCCUPATIONAL PROFILE AND HISTORY: Comprehensive assessment: Review of records and extensive additional review of physical, cognitive, psychosocial history related to current functional performance.  CLINICAL DECISION MAKING: High - multiple treatment options, significant modification of task necessary  REHAB POTENTIAL: Good  EVALUATION COMPLEXITY: High  PLAN:  OT FREQUENCY: 1x/week  OT DURATION: 6 weeks  PLANNED INTERVENTIONS: self care/ADL training, therapeutic exercise, therapeutic activity, neuromuscular re-education, manual therapy, passive range of motion, functional mobility training, electrical stimulation, and paraffin  RECOMMENDED OTHER SERVICES: OT  CONSULTED AND AGREED WITH PLAN OF CARE: Patient  PLAN FOR NEXT SESSION: therapeutic exercises, ADL/AE training   Herma Carson, OTS 9:11 AM  04/11/2023  This entire session was performed under the direct supervision and direction of a licensed therapist. I have personally read, edited, and approve of the  note as written.   Olegario Messier, MS, OTR/L   04/11/2023

## 2023-04-17 NOTE — Therapy (Signed)
OUTPATIENT PHYSICAL THERAPY NEURO TREATMENT NOTE    Patient Name: Dana Lucas MRN: [id number] DOB:10-09-1950, 72 y.o., female Today's Date: 04/17/2023   PCP: Bobbye Morton MD REFERRING PROVIDER: Louis Matte MD   END OF SESSION:  PT End of Session - 04/17/23 0850     Visit Number 27    Number of Visits 38    Date for PT Re-Evaluation 05/24/23    Authorization Type [employer] Medicare; [place] Medicaid    Authorization Time Period 03/01/23-05/24/23    Progress Note Due on Visit 30    PT Start Time 0851    PT Stop Time 0930    PT Time Calculation (min) 39 min    Equipment Utilized During Treatment Gait belt    Activity Tolerance Patient tolerated treatment well;Patient limited by fatigue    Behavior During Therapy WFL for tasks assessed/performed                        Past Medical History:  Diagnosis Date   Diabetes mellitus without complication (HCC)    Hypertension    Stroke [facility])    Past Surgical History:  Procedure Laterality Date   ABDOMINAL HYSTERECTOMY     COLONOSCOPY N/A 10/06/2019   Procedure: COLONOSCOPY;  Surgeon: Toledo, Boykin Nearing, MD;  Location: [facility] ENDOSCOPY;  Service: Gastroenterology;  Laterality: N/A;   Patient Active Problem List   Diagnosis Date Noted   Hypertension    Diabetes mellitus without complication (HCC)    Stroke (HCC)    GIB (gastrointestinal bleeding)    AKI (acute kidney injury) (HCC)    UTI (urinary tract infection)     ONSET DATE: 2020  REFERRING DIAG: 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   THERAPY DIAG:  Muscle weakness (generalized)  Difficulty in walking, not elsewhere classified  Other lack of coordination  Unsteadiness on feet  Rationale for Evaluation and Treatment: Rehabilitation  SUBJECTIVE:                                                                                                                                                                                              SUBJECTIVE STATEMENT:   Pt reports hand is feeling good today. She reports no stumbles/falls. She reports slight pain in her R knee. Pt reports no other updates/concerns.  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 Stroke was four years ago, has had multiple strokes. Had a little bit of therapy, afterwards in [place]. Moved up to [place] about  three years ago. [name] includes DM with diabetic neuropathy, stroke, HTN, AKI, UTI. Uses a walker to walk to the bathroom (two wheel and three wheel walkers).  Has a power chair for out of the house.   PAIN:  Are you having pain? Yes Right hand    PRECAUTIONS: Fall  WEIGHT BEARING RESTRICTIONS: No  FALLS: Has patient fallen in last 6 months? No  LIVING ENVIRONMENT: Lives with: lives with their family Lives in: House/apartment Stairs:  ramp outside Has following equipment at home: [name] - 2 wheeled, Environmental consultant - 4 wheeled, Wheelchair (power), shower chair, Ramped entry, and chair lift and hospital bed   PLOF: Independent  PATIENT GOALS: get up and walk by herself, wants to walk with a cane.   OBJECTIVE:   DIAGNOSTIC FINDINGS: CVA in [place]; no imaging in system  COGNITION: Overall cognitive status:  a little bit per granddaughter.    SENSATION: WFL  COORDINATION: Limited LLE     POSTURE: rounded shoulders, forward head, flexed trunk , and weight shift right    LOWER EXTREMITY MMT:    MMT Right Eval Left Eval  Hip flexion 4 2  Hip extension    Hip abduction 4 2+  Hip adduction 4 2+  Knee flexion 4 2-  Knee extension 4 2+  Ankle dorsiflexion 3   Ankle plantarflexion 3   (Blank rows = not tested)   TRANSFERS: Assistive device utilized: Environmental consultant - 2 wheeled  Sit to stand: CGA and Min A; blocking of L foot  Stand to sit: CGA Chair to chair: Min A    GAIT: Gait pattern: step to pattern, decreased  stance time- Left, decreased ankle dorsiflexion- Left, and poor foot clearance- Left Distance walked: 82ft Assistive device utilized: Environmental consultant - 2 wheeled Level of assistance: CGA Comments: L knee hyperextension with weightbearing  FUNCTIONAL TESTS:  5 times sit to stand: 38 seconds with SUE support  10 meter walk test: 1 min 43 seconds with RW  PATIENT SURVEYS:  FOTO 46  TODAY'S TREATMENT: DATE: 04/17/23 Unless otherwise stated, close CGA was provided and gait belt donned in order to ensure pt safety  TE: STS 1x from powerchair to RW; ambulation x 12 ft to nustep chair.  LE Strengthening- LAQ 10 reps- 1# AW LLE; 10 reps 1# RLE 10 reps- 1# AW LLE;12 reps 2# RLE 6 reps 2# AW LLE; 6 reps 3# RLE - rates hard   Seated Hip march each LE- AROM 3 sets of 10 reps  Nustep (interval training) PT adjusts intensity throughout and monitors pt for response to maintain therapeutic range. Min assist provided for LE/UE positioning in chair and mount/dismount.  Lvl 1 x 2 min Lvl 3 x 1 min - pt rates  easy Lvl 6 x 1 min - pt rates medium  Lvl 4 x 1 min -  Lvl 5 x 30 sec Lvl 1 x 30 sec Pt maintains SPM in 60s-70s   Amb in clinic with RW, CGA- 120 feet today- short reciprocal steps and one brief seated rest break at 56 ft.   STS from powerchair to RW 5x, last rep completed hands-free    PATIENT EDUCATION: Education details: Pt educated throughout session about proper posture and technique with exercises. Improved exercise technique, movement at target joints, use of target muscles after min to mod verbal, visual, tactile cues.   Person educated: Patient and daughter [name] Education method: Explanation, Demonstration, Tactile cues, and Verbal cues Education comprehension: verbalized understanding, returned demonstration, verbal cues  required, tactile cues required, and needs further education  HOME EXERCISE PROGRAM: Access Code: [email or web address] URL: [email or web address] Date:  12/06/2022 Prepared by: Precious Bard  Exercises - Leg Extension  - 1 x daily - 7 x weekly - 2 sets - 10 reps - 5 hold - Seated March  - 1 x daily - 7 x weekly - 2 sets - 10 reps - 5 hold - Seated Hip Abduction  - 1 x daily - 7 x weekly - 2 sets - 10 reps - 5 hold - Seated Hip Adduction Isometrics with Ball  - 1 x daily - 7 x weekly - 2 sets - 10 reps - 5 hold - Seated Weight Shifting Without Arm Support  - 1 x daily - 7 x weekly - 2 sets - 10 reps - 5 hold   GOALS: Goals reviewed with patient? Yes  SHORT TERM GOALS: Target date: 01/03/2023    Patient will be independent in home exercise program to improve strength/mobility for better functional independence with ADLs. Baseline: 01/26/2023: pt has some difficulty with HEP Goal status: IN PROGRESS    LONG TERM GOALS: Target date: 02/28/2023    Patient will increase FOTO score to equal to or greater than 53    to demonstrate statistically significant improvement in mobility and quality of life.  Baseline: 3/5: 46%; 01/26/23: 45% ; 03/01/23: 42%  Goal status: IN PROGRESS  2.  Patient (> 51 years old) will complete five times sit to stand test in < 15 seconds indicating an increased LE strength and improved balance. Baseline: 3/5: 38 seconds with SUE support; blocking of LLE; 01/26/23 18 seconds with pulling to stand; 03/01/23: 17 seconds pull to stand  Goal status: Partially MET  3.  Patient will increase 10 meter walk test to >1.76m/s as to improve gait speed for better community ambulation and to reduce fall risk Baseline: 3/5: 1 min 43 seconds with RW and w/c follow; 01/26/2023: 2 min 6 seconds; 03/01/23: 1 min 11 sec, 0.14 m/s with WC follow and 2WW  Goal status: IN PROGRESS  4.  Patient will increase BLE gross strength to 4/5 as to improve functional strength for independent gait, increased standing tolerance and increased ADL ability. Baseline: 3/5: grossly 2/5 LLE; 4//25/24: RLE grossly 4-/5, LLE grossly 2/5; 02/28/22 RLE grossly 4+/5  and LLE grossly 3+/5, exception 0/5 PF/DF (overall gross Bilat LE strength around 4-/5) Goal status: PARTIALLY MET    ASSESSMENT:  CLINICAL IMPRESSION:  Pt able to increase seated therex primarily in volume of reps performed. While pt able to progress, PT continues to provide min assist to LLE to promote full AROM of LE and proper technique. Pt also demonstrates ability complete STS hands-free, plan to advance. The pt will continue to benefit from skilled therapy to address remaining deficits in order to improve overall QoL and return to PLOF.     OBJECTIVE IMPAIRMENTS: Abnormal gait, cardiopulmonary status limiting activity, decreased activity tolerance, decreased balance, decreased coordination, decreased endurance, decreased knowledge of use of DME, decreased mobility, difficulty walking, decreased ROM, decreased strength, impaired perceived functional ability, impaired flexibility, impaired UE functional use, improper body mechanics, postural dysfunction, obesity, and pain.   ACTIVITY LIMITATIONS: carrying, lifting, bending, sitting, standing, squatting, sleeping, stairs, transfers, bed mobility, bathing, toileting, dressing, reach over head, and caring for others  PARTICIPATION LIMITATIONS: meal prep, cleaning, laundry, interpersonal relationship, driving, shopping, community activity, and church  PERSONAL FACTORS: Age, Behavior pattern, Education, Fitness, Past/current experiences, Profession, Sex, Time since onset of injury/illness/exacerbation,  and 3+ comorbidities: DM with diabetic neuropathy, stroke, HTN, AKI, UTI  are also affecting patient's functional outcome.   REHAB POTENTIAL: Fair length of time since CVA  CLINICAL DECISION MAKING: Evolving/moderate complexity  EVALUATION COMPLEXITY: Moderate  PLAN:  PT FREQUENCY: 2x/week  PT DURATION: 12 weeks  PLANNED INTERVENTIONS: Therapeutic exercises, Therapeutic activity, Neuromuscular re-education, Balance training, Gait  training, Patient/Family education, Self Care, Joint mobilization, Stair training, Vestibular training, Canalith repositioning, Visual/preceptual remediation/compensation, Orthotic/Fit training, DME instructions, Cognitive remediation, Electrical stimulation, Wheelchair mobility training, Spinal mobilization, Cryotherapy, Moist heat, Splintting, Taping, Traction, Ultrasound, Manual therapy, and Re-evaluation  PLAN FOR NEXT SESSION: stand in // bars, work on walking, strengthening LLE, STS Temple Pacini PT, DPT  Physical Therapist- [place]  [facility]   04/17/23, 9:32 AM

## 2023-04-18 ENCOUNTER — Ambulatory Visit: Payer: 59

## 2023-04-19 ENCOUNTER — Ambulatory Visit: Payer: 59

## 2023-04-21 ENCOUNTER — Ambulatory Visit: Payer: 59

## 2023-04-21 DIAGNOSIS — G8929 Other chronic pain: Secondary | ICD-10-CM

## 2023-04-21 DIAGNOSIS — R262 Difficulty in walking, not elsewhere classified: Secondary | ICD-10-CM

## 2023-04-21 DIAGNOSIS — R2681 Unsteadiness on feet: Secondary | ICD-10-CM

## 2023-04-21 DIAGNOSIS — R278 Other lack of coordination: Secondary | ICD-10-CM

## 2023-04-21 DIAGNOSIS — M6281 Muscle weakness (generalized): Secondary | ICD-10-CM

## 2023-04-21 NOTE — Therapy (Signed)
OUTPATIENT PHYSICAL THERAPY TREATMENT    Patient Name: Dana Lucas MRN: [id number] DOB:[birth year], 72 y.o., female Today's Date: 04/21/2023   PCP: Bobbye Morton MD REFERRING PROVIDER: Louis Matte MD   END OF SESSION:  PT End of Session - 04/21/23 1011     Visit Number 28    Number of Visits 38    Date for PT Re-Evaluation 05/24/23    Authorization Type [employer] Medicare; [place] Medicaid    Authorization Time Period 03/01/23-05/24/23    Progress Note Due on Visit 30    PT Start Time 0850    PT Stop Time 0931    PT Time Calculation (min) 41 min    Equipment Utilized During Treatment Gait belt    Activity Tolerance Patient tolerated treatment well;Patient limited by fatigue    Behavior During Therapy WFL for tasks assessed/performed                        Past Medical History:  Diagnosis Date   Diabetes mellitus without complication (HCC)    Hypertension    Stroke [facility])    Past Surgical History:  Procedure Laterality Date   ABDOMINAL HYSTERECTOMY     COLONOSCOPY N/A 10/06/2019   Procedure: COLONOSCOPY;  Surgeon: Toledo, Boykin Nearing, MD;  Location: [facility] ENDOSCOPY;  Service: Gastroenterology;  Laterality: N/A;   Patient Active Problem List   Diagnosis Date Noted   Hypertension    Diabetes mellitus without complication (HCC)    Stroke (HCC)    GIB (gastrointestinal bleeding)    AKI (acute kidney injury) (HCC)    UTI (urinary tract infection)     ONSET DATE: 2020  REFERRING DIAG: 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   THERAPY DIAG:  Muscle weakness (generalized)  Difficulty in walking, not elsewhere classified  Other lack of coordination  Unsteadiness on feet  Chronic pain of right knee  Rationale for Evaluation and Treatment: Rehabilitation  SUBJECTIVE:                                                                                                                                                                                              SUBJECTIVE STATEMENT:   Pt doing well. Does not have her AFO yet, but is in process.   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 Stroke was four years ago, has had multiple strokes. Had a little bit of therapy, afterwards in [place]. Moved up to [place] about three years ago. [name] includes  DM with diabetic neuropathy, stroke, HTN, AKI, UTI. Uses a walker to walk to the bathroom (two wheel and three wheel walkers).  Has a power chair for out of the house.   PAIN:  Are you having pain? Yes Right hand    PRECAUTIONS: Fall  WEIGHT BEARING RESTRICTIONS: No  FALLS: Has patient fallen in last 6 months? No  LIVING ENVIRONMENT: Lives with: lives with their family Lives in: House/apartment Stairs:  ramp outside Has following equipment at home: [name] - 2 wheeled, Environmental consultant - 4 wheeled, Wheelchair (power), shower chair, Ramped entry, and chair lift and hospital bed   PLOF: Independent  PATIENT GOALS: get up and walk by herself, wants to walk with a cane.   OBJECTIVE:   TODAY'S TREATMENT: DATE: 04/21/23  -STS from powerWC to RW, hands ad lib x2 -RLE LAQ x20 to reduce Rt knee DJD stiffness/pain -STS from powerWC + pillow to RW, hands ad lib x2 -STS from powerWC + airex pad to YRW, hands ad lib x4 -Overground AMB RW x75ft  *sit recovery  -Overground AMB RW x39ft (trial of clinic AFO unsuccessful, creates higher velocity recurvatum snap in Left early stance *sit recovery -Overground sidesteps in // bars facing out, 2 hands on same bar, 1x21ft bilat   *sit recovery -Overground sidesteps in // bars facing out, 2 hands on same bar, 1x45ft bilat *sit recovery -AMB to power WC x65ft c RW    PATIENT EDUCATION: Education details: discussed benefit and risks associated with AFO use, need to assure proper adjustments upon receipt for both knee and ankle  joints.    HOME EXERCISE PROGRAM: Access Code: [email or web address] URL: [email or web address] Date: 12/06/2022 Prepared by: Precious Bard  Exercises - Leg Extension  - 1 x daily - 7 x weekly - 2 sets - 10 reps - 5 hold - Seated March  - 1 x daily - 7 x weekly - 2 sets - 10 reps - 5 hold - Seated Hip Abduction  - 1 x daily - 7 x weekly - 2 sets - 10 reps - 5 hold - Seated Hip Adduction Isometrics with Ball  - 1 x daily - 7 x weekly - 2 sets - 10 reps - 5 hold - Seated Weight Shifting Without Arm Support  - 1 x daily - 7 x weekly - 2 sets - 10 reps - 5 hold   GOALS: Goals reviewed with patient? Yes  SHORT TERM GOALS: Target date: 01/03/2023  Patient will be independent in home exercise program to improve strength/mobility for better functional independence with ADLs. Baseline: 01/26/2023: pt has some difficulty with HEP Goal status: IN PROGRESS    LONG TERM GOALS: Target date: 02/28/2023  Patient will increase FOTO score to equal to or greater than 53    to demonstrate statistically significant improvement in mobility and quality of life.  Baseline: 3/5: 46%; 01/26/23: 45% ; 03/01/23: 42%  Goal status: IN PROGRESS  2.  Patient (> 77 years old) will complete five times sit to stand test in < 15 seconds indicating an increased LE strength and improved balance. Baseline: 3/5: 38 seconds with SUE support; blocking of LLE; 01/26/23 18 seconds with pulling to stand; 03/01/23: 17 seconds pull to stand  Goal status: Partially MET  3.  Patient will increase 10 meter walk test to >1.22m/s as to improve gait speed for better community ambulation and to reduce fall risk Baseline: 3/5: 1 min 43 seconds with RW and w/c follow; 01/26/2023: 2 min 6 seconds; 03/01/23: 1  min 11 sec, 0.14 m/s with WC follow and 2WW  Goal status: IN PROGRESS  4.  Patient will increase BLE gross strength to 4/5 as to improve functional strength for independent gait, increased standing tolerance and increased ADL  ability. Baseline: 3/5: grossly 2/5 LLE; 4//25/24: RLE grossly 4-/5, LLE grossly 2/5; 02/28/22 RLE grossly 4+/5 and LLE grossly 3+/5, exception 0/5 PF/DF (overall gross Bilat LE strength around 4-/5) Goal status: PARTIALLY MET    ASSESSMENT:  CLINICAL IMPRESSION:  Pt remains very motivated to advance her strength, AMB tolerance. Pt partakes in interventions as cued, shows excellent risk aversion and safety awareness, has well developed adaptive motor strategies for gross movements. Eschewed Nustep today in hopes to use extra time for additional overground AMB. Successful use of LAQ to loosen up Right knee in prep for gait/transfers. The pt will continue to benefit from skilled therapy to address remaining deficits in order to improve overall QoL and return to PLOF.     OBJECTIVE IMPAIRMENTS: Abnormal gait, cardiopulmonary status limiting activity, decreased activity tolerance, decreased balance, decreased coordination, decreased endurance, decreased knowledge of use of DME, decreased mobility, difficulty walking, decreased ROM, decreased strength, impaired perceived functional ability, impaired flexibility, impaired UE functional use, improper body mechanics, postural dysfunction, obesity, and pain.   ACTIVITY LIMITATIONS: carrying, lifting, bending, sitting, standing, squatting, sleeping, stairs, transfers, bed mobility, bathing, toileting, dressing, reach over head, and caring for others  PARTICIPATION LIMITATIONS: meal prep, cleaning, laundry, interpersonal relationship, driving, shopping, community activity, and church  PERSONAL FACTORS: Age, Behavior pattern, Education, Fitness, Past/current experiences, Profession, Sex, Time since onset of injury/illness/exacerbation, and 3+ comorbidities: DM with diabetic neuropathy, stroke, HTN, AKI, UTI  are also affecting patient's functional outcome.   REHAB POTENTIAL: Fair length of time since CVA  CLINICAL DECISION MAKING: Evolving/moderate  complexity  EVALUATION COMPLEXITY: Moderate  PLAN:  PT FREQUENCY: 2x/week  PT DURATION: 12 weeks  PLANNED INTERVENTIONS: Therapeutic exercises, Therapeutic activity, Neuromuscular re-education, Balance training, Gait training, Patient/Family education, Self Care, Joint mobilization, Stair training, Vestibular training, Canalith repositioning, Visual/preceptual remediation/compensation, Orthotic/Fit training, DME instructions, Cognitive remediation, Electrical stimulation, Wheelchair mobility training, Spinal mobilization, Cryotherapy, Moist heat, Splintting, Taping, Traction, Ultrasound, Manual therapy, and Re-evaluation  PLAN FOR NEXT SESSION: stand in // bars, work on walking, strengthening LLE, STS    10:17 AM, 04/21/23 Rosamaria Lints, PT, DPT Physical Therapist - [facility]  [phone] (ASCOM)    04/21/23, 10:16 AM

## 2023-04-25 ENCOUNTER — Ambulatory Visit: Payer: 59

## 2023-04-25 DIAGNOSIS — R2681 Unsteadiness on feet: Secondary | ICD-10-CM

## 2023-04-25 DIAGNOSIS — R262 Difficulty in walking, not elsewhere classified: Secondary | ICD-10-CM

## 2023-04-25 DIAGNOSIS — R278 Other lack of coordination: Secondary | ICD-10-CM

## 2023-04-25 DIAGNOSIS — M6281 Muscle weakness (generalized): Secondary | ICD-10-CM | POA: Diagnosis not present

## 2023-04-25 NOTE — Therapy (Signed)
OUTPATIENT PHYSICAL THERAPY TREATMENT    Patient Name: Dana Lucas MRN: [id number] DOB:10/06/1950, 72 y.o., female Today's Date: 04/25/2023   PCP: Bobbye Morton MD REFERRING PROVIDER: Louis Matte MD   END OF SESSION:  PT End of Session - 04/25/23 0854     Visit Number 29    Number of Visits 38    Date for PT Re-Evaluation 05/24/23    Authorization Type UHC Medicare; [place] Medicaid    Authorization Time Period 03/01/23-05/24/23    Progress Note Due on Visit 30    PT Start Time 0854    PT Stop Time 0930    PT Time Calculation (min) 36 min    Equipment Utilized During Treatment Gait belt    Activity Tolerance Patient tolerated treatment well;Patient limited by fatigue    Behavior During Therapy WFL for tasks assessed/performed                        Past Medical History:  Diagnosis Date   Diabetes mellitus without complication (HCC)    Hypertension    Stroke [facility])    Past Surgical History:  Procedure Laterality Date   ABDOMINAL HYSTERECTOMY     COLONOSCOPY N/A 10/06/2019   Procedure: COLONOSCOPY;  Surgeon: Toledo, Boykin Nearing, MD;  Location: [facility] ENDOSCOPY;  Service: Gastroenterology;  Laterality: N/A;   Patient Active Problem List   Diagnosis Date Noted   Hypertension    Diabetes mellitus without complication (HCC)    Stroke (HCC)    GIB (gastrointestinal bleeding)    AKI (acute kidney injury) (HCC)    UTI (urinary tract infection)     ONSET DATE: 2020  REFERRING DIAG: 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   THERAPY DIAG:  Difficulty in walking, not elsewhere classified  Muscle weakness (generalized)  Other lack of coordination  Unsteadiness on feet  Rationale for Evaluation and Treatment: Rehabilitation  SUBJECTIVE:                                                                                                                                                                                              SUBJECTIVE STATEMENT:   Pt doing well, no updates.  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 Stroke was four years ago, has had multiple strokes. Had a little bit of therapy, afterwards in [place]. Moved up to [place] about three years ago. [name] includes DM with diabetic neuropathy, stroke, HTN, AKI, UTI. Uses a walker to walk to the  bathroom (two wheel and three wheel walkers).  Has a power chair for out of the house.   PAIN:  Are you having pain? Yes Right hand    PRECAUTIONS: Fall  WEIGHT BEARING RESTRICTIONS: No  FALLS: Has patient fallen in last 6 months? No  LIVING ENVIRONMENT: Lives with: lives with their family Lives in: House/apartment Stairs:  ramp outside Has following equipment at home: [name] - 2 wheeled, Environmental consultant - 4 wheeled, Wheelchair (power), shower chair, Ramped entry, and chair lift and hospital bed   PLOF: Independent  PATIENT GOALS: get up and walk by herself, wants to walk with a cane.   OBJECTIVE:   TODAY'S TREATMENT: DATE: 04/25/23  TE:  -Amb 112 ft with RW, CGA for endurance -STS 5x with UUE support  -Amb 135 ft with RW, CGA for endurance -STS 8x with UUE support  -Amb 135 ft with RW, CGA for endurance -STS 10x with UUE support  -Amb 29 ft with RW, CGA for endurance - hedgehog seated toe tap 3x10 each LE     PATIENT EDUCATION: Education details: discussed benefit and risks associated with AFO use, need to assure proper adjustments upon receipt for both knee and ankle joints.    HOME EXERCISE PROGRAM: Access Code: [email or web address] URL: [email or web address] Date: 12/06/2022 Prepared by: Precious Bard  Exercises - Leg Extension  - 1 x daily - 7 x weekly - 2 sets - 10 reps - 5 hold - Seated March  - 1 x daily - 7 x weekly - 2 sets - 10 reps - 5 hold - Seated Hip Abduction  - 1 x daily - 7 x weekly - 2 sets - 10 reps -  5 hold - Seated Hip Adduction Isometrics with Ball  - 1 x daily - 7 x weekly - 2 sets - 10 reps - 5 hold - Seated Weight Shifting Without Arm Support  - 1 x daily - 7 x weekly - 2 sets - 10 reps - 5 hold   GOALS: Goals reviewed with patient? Yes  SHORT TERM GOALS: Target date: 01/03/2023  Patient will be independent in home exercise program to improve strength/mobility for better functional independence with ADLs. Baseline: 01/26/2023: pt has some difficulty with HEP Goal status: IN PROGRESS    LONG TERM GOALS: Target date: 02/28/2023  Patient will increase FOTO score to equal to or greater than 53    to demonstrate statistically significant improvement in mobility and quality of life.  Baseline: 3/5: 46%; 01/26/23: 45% ; 03/01/23: 42%  Goal status: IN PROGRESS  2.  Patient (> 91 years old) will complete five times sit to stand test in < 15 seconds indicating an increased LE strength and improved balance. Baseline: 3/5: 38 seconds with SUE support; blocking of LLE; 01/26/23 18 seconds with pulling to stand; 03/01/23: 17 seconds pull to stand  Goal status: Partially MET  3.  Patient will increase 10 meter walk test to >1.30m/s as to improve gait speed for better community ambulation and to reduce fall risk Baseline: 3/5: 1 min 43 seconds with RW and w/c follow; 01/26/2023: 2 min 6 seconds; 03/01/23: 1 min 11 sec, 0.14 m/s with WC follow and 2WW  Goal status: IN PROGRESS  4.  Patient will increase BLE gross strength to 4/5 as to improve functional strength for independent gait, increased standing tolerance and increased ADL ability. Baseline: 3/5: grossly 2/5 LLE; 4//25/24: RLE grossly 4-/5, LLE grossly 2/5; 02/28/22 RLE grossly 4+/5 and LLE grossly 3+/5, exception 0/5 PF/DF (  overall gross Bilat LE strength around 4-/5) Goal status: PARTIALLY MET    ASSESSMENT:  CLINICAL IMPRESSION:  Pt continues to advance endurance/gait activity by ambulating multiple rounds of increased distances. She also  shows improved activity tolerance as she was able to complete multiple STS following ambulation. Plan tor reassess goals next visit. The pt will continue to benefit from skilled therapy to address remaining deficits in order to improve overall QoL and return to PLOF.     OBJECTIVE IMPAIRMENTS: Abnormal gait, cardiopulmonary status limiting activity, decreased activity tolerance, decreased balance, decreased coordination, decreased endurance, decreased knowledge of use of DME, decreased mobility, difficulty walking, decreased ROM, decreased strength, impaired perceived functional ability, impaired flexibility, impaired UE functional use, improper body mechanics, postural dysfunction, obesity, and pain.   ACTIVITY LIMITATIONS: carrying, lifting, bending, sitting, standing, squatting, sleeping, stairs, transfers, bed mobility, bathing, toileting, dressing, reach over head, and caring for others  PARTICIPATION LIMITATIONS: meal prep, cleaning, laundry, interpersonal relationship, driving, shopping, community activity, and church  PERSONAL FACTORS: Age, Behavior pattern, Education, Fitness, Past/current experiences, Profession, Sex, Time since onset of injury/illness/exacerbation, and 3+ comorbidities: DM with diabetic neuropathy, stroke, HTN, AKI, UTI  are also affecting patient's functional outcome.   REHAB POTENTIAL: Fair length of time since CVA  CLINICAL DECISION MAKING: Evolving/moderate complexity  EVALUATION COMPLEXITY: Moderate  PLAN:  PT FREQUENCY: 2x/week  PT DURATION: 12 weeks  PLANNED INTERVENTIONS: Therapeutic exercises, Therapeutic activity, Neuromuscular re-education, Balance training, Gait training, Patient/Family education, Self Care, Joint mobilization, Stair training, Vestibular training, Canalith repositioning, Visual/preceptual remediation/compensation, Orthotic/Fit training, DME instructions, Cognitive remediation, Electrical stimulation, Wheelchair mobility training, Spinal  mobilization, Cryotherapy, Moist heat, Splintting, Taping, Traction, Ultrasound, Manual therapy, and Re-evaluation  PLAN FOR NEXT SESSION: stand in // bars, work on walking, strengthening LLE, STS    9:37 AM, 04/25/23 Temple Pacini PT, DPT  Physical Therapist - [facility]  [phone] (ASCOM)    04/25/23, 9:37 AM

## 2023-04-28 ENCOUNTER — Ambulatory Visit: Payer: 59 | Admitting: Physical Therapy

## 2023-04-28 DIAGNOSIS — R262 Difficulty in walking, not elsewhere classified: Secondary | ICD-10-CM

## 2023-04-28 DIAGNOSIS — M6281 Muscle weakness (generalized): Secondary | ICD-10-CM

## 2023-04-28 DIAGNOSIS — R278 Other lack of coordination: Secondary | ICD-10-CM

## 2023-04-28 DIAGNOSIS — R2681 Unsteadiness on feet: Secondary | ICD-10-CM

## 2023-04-28 NOTE — Therapy (Signed)
OUTPATIENT PHYSICAL THERAPY TREATMENT/ PHYSICAL THERAPY PROGRESS NOTE   Dates of reporting period  03/21/2023   to   04/28/2023      Patient Name: Dana Lucas MRN: [id number] DOB:02/09/1951, 72 y.o., female Today's Date: 04/28/2023   PCP: Bobbye Morton MD REFERRING PROVIDER: Louis Matte MD   END OF SESSION:  PT End of Session - 04/28/23 1004     Visit Number 30    Number of Visits 38    Date for PT Re-Evaluation 05/24/23    Authorization Type UHC Medicare; [place] Medicaid    Authorization Time Period 03/01/23-05/24/23    Progress Note Due on Visit 40    PT Start Time 0849    PT Stop Time 0930    PT Time Calculation (min) 41 min    Equipment Utilized During Treatment Gait belt    Activity Tolerance Patient tolerated treatment well;Patient limited by fatigue    Behavior During Therapy WFL for tasks assessed/performed                         Past Medical History:  Diagnosis Date   Diabetes mellitus without complication (HCC)    Hypertension    Stroke [facility])    Past Surgical History:  Procedure Laterality Date   ABDOMINAL HYSTERECTOMY     COLONOSCOPY N/A 10/06/2019   Procedure: COLONOSCOPY;  Surgeon: Toledo, Boykin Nearing, MD;  Location: [facility] ENDOSCOPY;  Service: Gastroenterology;  Laterality: N/A;   Patient Active Problem List   Diagnosis Date Noted   Hypertension    Diabetes mellitus without complication (HCC)    Stroke (HCC)    GIB (gastrointestinal bleeding)    AKI (acute kidney injury) (HCC)    UTI (urinary tract infection)     ONSET DATE: 2020  REFERRING DIAG: 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   THERAPY DIAG:  Difficulty in walking, not elsewhere classified  Muscle weakness (generalized)  Other lack of coordination  Unsteadiness on feet  Rationale for Evaluation and Treatment: Rehabilitation  SUBJECTIVE:                                                                                                                                                                                              SUBJECTIVE STATEMENT:   Pt doing well, no updates. States that she will be getting AFO next week. Was able to pick up from clinic yesterday, but did no leave house due to poor weather  Pt accompanied by: self  PERTINENT HISTORY:   Patient presents for Hemiplegia and hemiparesis following CVA affecting L  non dominant side. Stroke was four years ago, has had multiple strokes. Had a little bit of therapy, afterwards in [place]. Moved up to [place] about three years ago. [name] includes DM with diabetic neuropathy, stroke, HTN, AKI, UTI. Uses a walker to walk to the bathroom (two wheel and three wheel walkers).  Has a power chair for out of the house.   PAIN:  Are you having pain? Yes Right hand    PRECAUTIONS: Fall  WEIGHT BEARING RESTRICTIONS: No  FALLS: Has patient fallen in last 6 months? No  LIVING ENVIRONMENT: Lives with: lives with their family Lives in: House/apartment Stairs:  ramp outside Has following equipment at home: [name] - 2 wheeled, Environmental consultant - 4 wheeled, Wheelchair (power), shower chair, Ramped entry, and chair lift and hospital bed   PLOF: Independent  PATIENT GOALS: get up and walk by herself, wants to walk with a cane.   OBJECTIVE:   TODAY'S TREATMENT: DATE: 04/28/23  Pt instructed in goal assessment to measure progress towards long-term goals.  See below in goal assessment for details  Stand pivot transfers performed x 4 with upper extremity on rolling walker contact-guard assist from PT min cues from PT to reduce genu recurvatum on the left lower extremity.     PATIENT EDUCATION: Education details: discussed benefit and risks associated with AFO use, need to assure proper adjustments upon receipt for both knee and ankle joints.    HOME EXERCISE PROGRAM: Access Code: [email or web address] URL: [email or web address] Date: 12/06/2022 Prepared by:  Precious Bard  Exercises - Leg Extension  - 1 x daily - 7 x weekly - 2 sets - 10 reps - 5 hold - Seated March  - 1 x daily - 7 x weekly - 2 sets - 10 reps - 5 hold - Seated Hip Abduction  - 1 x daily - 7 x weekly - 2 sets - 10 reps - 5 hold - Seated Hip Adduction Isometrics with Ball  - 1 x daily - 7 x weekly - 2 sets - 10 reps - 5 hold - Seated Weight Shifting Without Arm Support  - 1 x daily - 7 x weekly - 2 sets - 10 reps - 5 hold   GOALS: Goals reviewed with patient? Yes  SHORT TERM GOALS: Target date: 01/03/2023  Patient will be independent in home exercise program to improve strength/mobility for better functional independence with ADLs. Baseline: 01/26/2023: pt has some difficulty with HEP Goal status: IN PROGRESS    LONG TERM GOALS: Target date: 02/28/2023  Patient will increase FOTO score to equal to or greater than 53    to demonstrate statistically significant improvement in mobility and quality of life.  Baseline: 3/5: 46%; 01/26/23: 45% ; 03/01/23: 42% 04/28/23: 52.5  Goal status: IN PROGRESS  2.  Patient (> 15 years old) will complete five times sit to stand test in < 15 seconds indicating an increased LE strength and improved balance. Baseline: 3/5: 38 seconds with SUE support; blocking of LLE; 01/26/23 18 seconds with pulling to stand; 03/01/23: 17 seconds pull to stand  04/28/23. 21.27(average of 2 trials) BUE on RW and CGA from PT  Goal status: IN PROGRESS  3.  Patient will increase 10 meter walk test to >1.78m/s as to improve gait speed for better community ambulation and to reduce fall risk Baseline: 3/5: 1 min 43 seconds with RW and w/c follow; 01/26/2023: 2 min 6 seconds; 03/01/23: 1 min 11 sec, 0.14 m/s with WC follow and 2WW. 04/28/23: 56 sec (  average of 2 trials) With 2WW.   Goal status: IN PROGRESS  4.  Patient will increase BLE gross strength to 4/5 as to improve functional strength for independent gait, increased standing tolerance and increased ADL  ability. Baseline: 3/5: grossly 2/5 LLE; 4//25/24: RLE grossly 4-/5, LLE grossly 2/5; 03/01/23 RLE grossly 4+/5 and LLE grossly 3+/5, exception 0/5 PF/DF (overall gross Bilat LE strength around 4-/5) 7/26: RLE 4+/5 except 5/5 knee extension. LLE: 4-/5 knee extension, hip adduction 4+/5, all others 3+/5 except ankle DF 2/5 and PF 2-/5  Goal status: PARTIALLY MET    ASSESSMENT:  CLINICAL IMPRESSION:  Pt put forth good effort throughout therapy session towards goal assessment.  Patient demonstrates improved balance and function as evidenced by reduced time on 5 times sit to stand, improved gait speed with 10 m walk test.  Increased strength in the left lower extremity, with noted activation in left dorsiflexors and plantar flexors on this day.  PT perform slight adjustment to wheelchair to stabilize foot rest and allow improved safety with transfers.  Patient's condition has the potential to improve in response to therapy. Maximum improvement is yet to be obtained. The anticipated improvement is attainable and reasonable in a generally predictable time.  Patient also demonstrates improved perceived function as evidenced by increased score on FOTO 52.5. The pt will continue to benefit from skilled therapy to address remaining deficits in order to improve overall QoL and return to PLOF.     OBJECTIVE IMPAIRMENTS: Abnormal gait, cardiopulmonary status limiting activity, decreased activity tolerance, decreased balance, decreased coordination, decreased endurance, decreased knowledge of use of DME, decreased mobility, difficulty walking, decreased ROM, decreased strength, impaired perceived functional ability, impaired flexibility, impaired UE functional use, improper body mechanics, postural dysfunction, obesity, and pain.   ACTIVITY LIMITATIONS: carrying, lifting, bending, sitting, standing, squatting, sleeping, stairs, transfers, bed mobility, bathing, toileting, dressing, reach over head, and caring for  others  PARTICIPATION LIMITATIONS: meal prep, cleaning, laundry, interpersonal relationship, driving, shopping, community activity, and church  PERSONAL FACTORS: Age, Behavior pattern, Education, Fitness, Past/current experiences, Profession, Sex, Time since onset of injury/illness/exacerbation, and 3+ comorbidities: DM with diabetic neuropathy, stroke, HTN, AKI, UTI  are also affecting patient's functional outcome.   REHAB POTENTIAL: Fair length of time since CVA  CLINICAL DECISION MAKING: Evolving/moderate complexity  EVALUATION COMPLEXITY: Moderate  PLAN:  PT FREQUENCY: 2x/week  PT DURATION: 12 weeks  PLANNED INTERVENTIONS: Therapeutic exercises, Therapeutic activity, Neuromuscular re-education, Balance training, Gait training, Patient/Family education, Self Care, Joint mobilization, Stair training, Vestibular training, Canalith repositioning, Visual/preceptual remediation/compensation, Orthotic/Fit training, DME instructions, Cognitive remediation, Electrical stimulation, Wheelchair mobility training, Spinal mobilization, Cryotherapy, Moist heat, Splintting, Taping, Traction, Ultrasound, Manual therapy, and Re-evaluation  PLAN FOR NEXT SESSION:   stand in rolling walker, work on walking, strengthening LLE,   Grier Rocher PT, DPT  Physical Therapist - [place]  [facility]  11:31 AM 04/28/23

## 2023-05-02 ENCOUNTER — Ambulatory Visit: Payer: 59

## 2023-05-02 DIAGNOSIS — R262 Difficulty in walking, not elsewhere classified: Secondary | ICD-10-CM

## 2023-05-02 DIAGNOSIS — R2681 Unsteadiness on feet: Secondary | ICD-10-CM

## 2023-05-02 DIAGNOSIS — M6281 Muscle weakness (generalized): Secondary | ICD-10-CM | POA: Diagnosis not present

## 2023-05-02 DIAGNOSIS — R278 Other lack of coordination: Secondary | ICD-10-CM

## 2023-05-02 NOTE — Therapy (Signed)
OUTPATIENT PHYSICAL THERAPY TREATMENT     Patient Name: Dana Lucas MRN: [id number] DOB:03-12-1951, 72 y.o., female Today's Date: 05/02/2023   PCP: Bobbye Morton MD REFERRING PROVIDER: Louis Matte MD   END OF SESSION:  PT End of Session - 05/02/23 1332     Visit Number 31    Number of Visits 38    Date for PT Re-Evaluation 05/24/23    Authorization Type [employer]HC Medicare; [place] Medicaid    Authorization Time Period 03/01/23-05/24/23    Progress Note Due on Visit 40    PT Start Time 1106    PT Stop Time 1145    PT Time Calculation (min) 39 min    Equipment Utilized During Treatment Gait belt    Activity Tolerance Patient tolerated treatment well;Patient limited by fatigue    Behavior During Therapy WFL for tasks assessed/performed                          Past Medical History:  Diagnosis Date   Diabetes mellitus without complication (HCC)    Hypertension    Stroke [facility]    Past Surgical History:  Procedure Laterality Date   ABDOMINAL HYSTERECTOMY     COLONOSCOPY N/A 10/06/2019   Procedure: COLONOSCOPY;  Surgeon: Toledo, Boykin Nearing, MD;  Location: [facility] ENDOSCOPY;  Service: Gastroenterology;  Laterality: N/A;   Patient Active Problem List   Diagnosis Date Noted   Hypertension    Diabetes mellitus without complication (HCC)    Stroke (HCC)    GIB (gastrointestinal bleeding)    AKI (acute kidney injury) (HCC)    UTI (urinary tract infection)     ONSET DATE: 2020  REFERRING DIAG: 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   THERAPY DIAG:  Difficulty in walking, not elsewhere classified  Other lack of coordination  Unsteadiness on feet  Muscle weakness (generalized)  Rationale for Evaluation and Treatment: Rehabilitation  SUBJECTIVE:                                                                                                                                                                                              SUBJECTIVE STATEMENT:   Pt with no updates today. Doing ok currently.   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 Stroke was four years ago, has had multiple strokes. Had a little bit of therapy, afterwards in [place]. Moved up to [place] about three years ago. [name] includes DM with diabetic neuropathy, stroke, HTN, AKI, UTI.  Uses a walker to walk to the bathroom (two wheel and three wheel walkers).  Has a power chair for out of the house.   PAIN:  Are you having pain? Yes Right hand    PRECAUTIONS: Fall  WEIGHT BEARING RESTRICTIONS: No  FALLS: Has patient fallen in last 6 months? No  LIVING ENVIRONMENT: Lives with: lives with their family Lives in: House/apartment Stairs:  ramp outside Has following equipment at home: [name] - 2 wheeled, Environmental consultant - 4 wheeled, Wheelchair (power), shower chair, Ramped entry, and chair lift and hospital bed   PLOF: Independent  PATIENT GOALS: get up and walk by herself, wants to walk with a cane.   OBJECTIVE:   TODAY'S TREATMENT: DATE: 05/02/23  TE: With gait belt donned throughout: Gait for strength/endurance x 172 ft with RW  and close CGA STS 10x hands-free  Gait for strength/endurance 148 ft with RW and close CGA (1 seated rest break during set)  STS 5x Gait for strength/endurance x16 ft with RW from chair to power chair   NMR: Basket ball balance in WBOS x 1 round and NBOS x 1 round, close CGA throughout. Intermittent UE support on RW    PATIENT EDUCATION: Education details: Pt educated throughout session about proper posture and technique with exercises. Improved exercise technique, movement at target joints, use of target muscles after min to mod verbal, visual, tactile cues.    HOME EXERCISE PROGRAM: continue HEP as previously given Access Code: [email or web address] URL: [email or web address] Date:  12/06/2022 Prepared by: Precious Bard  Exercises - Leg Extension  - 1 x daily - 7 x weekly - 2 sets - 10 reps - 5 hold - Seated March  - 1 x daily - 7 x weekly - 2 sets - 10 reps - 5 hold - Seated Hip Abduction  - 1 x daily - 7 x weekly - 2 sets - 10 reps - 5 hold - Seated Hip Adduction Isometrics with Ball  - 1 x daily - 7 x weekly - 2 sets - 10 reps - 5 hold - Seated Weight Shifting Without Arm Support  - 1 x daily - 7 x weekly - 2 sets - 10 reps - 5 hold   GOALS: Goals reviewed with patient? Yes  SHORT TERM GOALS: Target date: 01/03/2023  Patient will be independent in home exercise program to improve strength/mobility for better functional independence with ADLs. Baseline: 01/26/2023: pt has some difficulty with HEP Goal status: IN PROGRESS    LONG TERM GOALS: Target date: 02/28/2023  Patient will increase FOTO score to equal to or greater than 53    to demonstrate statistically significant improvement in mobility and quality of life.  Baseline: 3/5: 46%; 01/26/23: 45% ; 03/01/23: 42% 04/28/23: 52.5  Goal status: IN PROGRESS  2.  Patient (> 28 years old) will complete five times sit to stand test in < 15 seconds indicating an increased LE strength and improved balance. Baseline: 3/5: 38 seconds with SUE support; blocking of LLE; 01/26/23 18 seconds with pulling to stand; 03/01/23: 17 seconds pull to stand  04/28/23. 21.27(average of 2 trials) BUE on RW and CGA from PT  Goal status: IN PROGRESS  3.  Patient will increase 10 meter walk test to >1.40m/s as to improve gait speed for better community ambulation and to reduce fall risk Baseline: 3/5: 1 min 43 seconds with RW and w/c follow; 01/26/2023: 2 min 6 seconds; 03/01/23: 1 min 11 sec, 0.14 m/s with WC follow and 2WW. 04/28/23: 56  sec (average of 2 trials) With 2WW.   Goal status: IN PROGRESS  4.  Patient will increase BLE gross strength to 4/5 as to improve functional strength for independent gait, increased standing tolerance and  increased ADL ability. Baseline: 3/5: grossly 2/5 LLE; 4//25/24: RLE grossly 4-/5, LLE grossly 2/5; 03/01/23 RLE grossly 4+/5 and LLE grossly 3+/5, exception 0/5 PF/DF (overall gross Bilat LE strength around 4-/5) 7/26: RLE 4+/5 except 5/5 knee extension. LLE: 4-/5 knee extension, hip adduction 4+/5, all others 3+/5 except ankle DF 2/5 and PF 2-/5  Goal status: PARTIALLY MET    ASSESSMENT:  CLINICAL IMPRESSION:  Session focused primarily on building endurance and strength for gait with longer distances. Pt able to follow up bouts of ambulation with multiple STS completed hands-free. She tolerated this interventions well without any pain and with mild fatigue, requiring a few brief rest breaks throughout. The pt will continue to benefit from skilled therapy to address remaining deficits in order to improve overall QoL and return to PLOF.     OBJECTIVE IMPAIRMENTS: Abnormal gait, cardiopulmonary status limiting activity, decreased activity tolerance, decreased balance, decreased coordination, decreased endurance, decreased knowledge of use of DME, decreased mobility, difficulty walking, decreased ROM, decreased strength, impaired perceived functional ability, impaired flexibility, impaired UE functional use, improper body mechanics, postural dysfunction, obesity, and pain.   ACTIVITY LIMITATIONS: carrying, lifting, bending, sitting, standing, squatting, sleeping, stairs, transfers, bed mobility, bathing, toileting, dressing, reach over head, and caring for others  PARTICIPATION LIMITATIONS: meal prep, cleaning, laundry, interpersonal relationship, driving, shopping, community activity, and church  PERSONAL FACTORS: Age, Behavior pattern, Education, Fitness, Past/current experiences, Profession, Sex, Time since onset of injury/illness/exacerbation, and 3+ comorbidities: DM with diabetic neuropathy, stroke, HTN, AKI, UTI  are also affecting patient's functional outcome.   REHAB POTENTIAL: Fair length  of time since CVA  CLINICAL DECISION MAKING: Evolving/moderate complexity  EVALUATION COMPLEXITY: Moderate  PLAN:  PT FREQUENCY: 2x/week  PT DURATION: 12 weeks  PLANNED INTERVENTIONS: Therapeutic exercises, Therapeutic activity, Neuromuscular re-education, Balance training, Gait training, Patient/Family education, Self Care, Joint mobilization, Stair training, Vestibular training, Canalith repositioning, Visual/preceptual remediation/compensation, Orthotic/Fit training, DME instructions, Cognitive remediation, Electrical stimulation, Wheelchair mobility training, Spinal mobilization, Cryotherapy, Moist heat, Splintting, Taping, Traction, Ultrasound, Manual therapy, and Re-evaluation  PLAN FOR NEXT SESSION:   stand in rolling walker, work on walking, strengthening LLE, balance   Temple Pacini PT, DPT  Physical Therapist - [place]  [facility]  1:36 PM 05/02/23

## 2023-05-05 ENCOUNTER — Ambulatory Visit: Payer: 59 | Admitting: Physical Therapy

## 2023-05-05 ENCOUNTER — Encounter: Payer: 59 | Admitting: Occupational Therapy

## 2023-05-08 ENCOUNTER — Encounter: Payer: Self-pay | Admitting: Physical Therapy

## 2023-05-08 ENCOUNTER — Ambulatory Visit: Payer: 59 | Attending: Internal Medicine | Admitting: Physical Therapy

## 2023-05-08 DIAGNOSIS — R2681 Unsteadiness on feet: Secondary | ICD-10-CM | POA: Insufficient documentation

## 2023-05-08 DIAGNOSIS — R278 Other lack of coordination: Secondary | ICD-10-CM | POA: Insufficient documentation

## 2023-05-08 DIAGNOSIS — M6281 Muscle weakness (generalized): Secondary | ICD-10-CM | POA: Insufficient documentation

## 2023-05-08 DIAGNOSIS — R262 Difficulty in walking, not elsewhere classified: Secondary | ICD-10-CM | POA: Diagnosis present

## 2023-05-08 DIAGNOSIS — M25561 Pain in right knee: Secondary | ICD-10-CM | POA: Insufficient documentation

## 2023-05-08 DIAGNOSIS — G8929 Other chronic pain: Secondary | ICD-10-CM | POA: Diagnosis present

## 2023-05-08 NOTE — Therapy (Signed)
OUTPATIENT PHYSICAL THERAPY TREATMENT     Patient Name: Charmian Ferran MRN: [id number] DOB:1951/04/05, 72 y.o., female Today's Date: 05/08/2023   PCP: Bobbye Morton MD REFERRING PROVIDER: Louis Matte MD   END OF SESSION:  PT End of Session - 05/08/23 0820     Visit Number 32    Number of Visits 38    Date for PT Re-Evaluation 05/24/23    Authorization Type [employer] Medicare; [place] Medicaid    Authorization Time Period 03/01/23-05/24/23    Progress Note Due on Visit 40    PT Start Time 0847    PT Stop Time 0928    PT Time Calculation (min) 41 min    Equipment Utilized During Treatment Gait belt    Activity Tolerance Patient tolerated treatment well;Patient limited by fatigue    Behavior During Therapy WFL for tasks assessed/performed                          Past Medical History:  Diagnosis Date   Diabetes mellitus without complication (HCC)    Hypertension    Stroke [facility])    Past Surgical History:  Procedure Laterality Date   ABDOMINAL HYSTERECTOMY     COLONOSCOPY N/A 10/06/2019   Procedure: COLONOSCOPY;  Surgeon: Toledo, Boykin Nearing, MD;  Location: [facility] ENDOSCOPY;  Service: Gastroenterology;  Laterality: N/A;   Patient Active Problem List   Diagnosis Date Noted   Hypertension    Diabetes mellitus without complication (HCC)    Stroke (HCC)    GIB (gastrointestinal bleeding)    AKI (acute kidney injury) (HCC)    UTI (urinary tract infection)     ONSET DATE: 2020  REFERRING DIAG: 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   THERAPY DIAG:  Difficulty in walking, not elsewhere classified  Unsteadiness on feet  Muscle weakness (generalized)  Rationale for Evaluation and Treatment: Rehabilitation  SUBJECTIVE:                                                                                                                                                                                             SUBJECTIVE  STATEMENT:   Pt reports doing well today. Pt denies any recent falls/stumbles since prior session. Pt presents with new AFO and has PT assist with donning it at beginning of session.    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 Stroke was four years ago, has had multiple strokes. Had a little bit of therapy, afterwards in [place]. Moved up  to [place] about three years ago. [name] includes DM with diabetic neuropathy, stroke, HTN, AKI, UTI. Uses a walker to walk to the bathroom (two wheel and three wheel walkers).  Has a power chair for out of the house.   PAIN:  Are you having pain? Yes Right hand    PRECAUTIONS: Fall  WEIGHT BEARING RESTRICTIONS: No  FALLS: Has patient fallen in last 6 months? No  LIVING ENVIRONMENT: Lives with: lives with their family Lives in: House/apartment Stairs:  ramp outside Has following equipment at home: [name] - 2 wheeled, Environmental consultant - 4 wheeled, Wheelchair (power), shower chair, Ramped entry, and chair lift and hospital bed   PLOF: Independent  PATIENT GOALS: get up and walk by herself, wants to walk with a cane.   OBJECTIVE:   TODAY'S TREATMENT: DATE: 05/08/23 At start of session assisted pt with donning new AFO device and donning different pair of shoes.  TE: With gait belt donned throughout: Gait for strength/endurance x 75 ft with RW  and close CGA STS 5x from power chair, assistance required for first few reps Gait for strength/endurance 75 ft with RW and close CGA  STS 5x Gait for strength/endurance x85 ft with RW from cSTS 5 x  10 x LAQ 10 x gluteal press down  Ambulation with walker to WC ( 12 ft) and transfer to [facility] to end session     PATIENT EDUCATION: Education details: Pt educated throughout session about proper posture and technique with exercises. Improved exercise technique, movement at target joints, use of target muscles  after min to mod verbal, visual, tactile cues.    HOME EXERCISE PROGRAM: continue HEP as previously given Access Code: [email or web address] URL: [email or web address] Date: 12/06/2022 Prepared by: Precious Bard  Exercises - Leg Extension  - 1 x daily - 7 x weekly - 2 sets - 10 reps - 5 hold - Seated March  - 1 x daily - 7 x weekly - 2 sets - 10 reps - 5 hold - Seated Hip Abduction  - 1 x daily - 7 x weekly - 2 sets - 10 reps - 5 hold - Seated Hip Adduction Isometrics with Ball  - 1 x daily - 7 x weekly - 2 sets - 10 reps - 5 hold - Seated Weight Shifting Without Arm Support  - 1 x daily - 7 x weekly - 2 sets - 10 reps - 5 hold   GOALS: Goals reviewed with patient? Yes  SHORT TERM GOALS: Target date: 01/03/2023  Patient will be independent in home exercise program to improve strength/mobility for better functional independence with ADLs. Baseline: 01/26/2023: pt has some difficulty with HEP Goal status: IN PROGRESS    LONG TERM GOALS: Target date: 02/28/2023  Patient will increase FOTO score to equal to or greater than 53    to demonstrate statistically significant improvement in mobility and quality of life.  Baseline: 3/5: 46%; 01/26/23: 45% ; 03/01/23: 42% 04/28/23: 52.5  Goal status: IN PROGRESS  2.  Patient (> 73 years old) will complete five times sit to stand test in < 15 seconds indicating an increased LE strength and improved balance. Baseline: 3/5: 38 seconds with SUE support; blocking of LLE; 01/26/23 18 seconds with pulling to stand; 03/01/23: 17 seconds pull to stand  04/28/23. 21.27(average of 2 trials) BUE on RW and CGA from PT  Goal status: IN PROGRESS  3.  Patient will increase 10 meter walk test to >1.1m/s as to improve gait speed for better community  ambulation and to reduce fall risk Baseline: 3/5: 1 min 43 seconds with RW and w/c follow; 01/26/2023: 2 min 6 seconds; 03/01/23: 1 min 11 sec, 0.14 m/s with WC follow and 2WW. 04/28/23: 56 sec (average of 2 trials) With  2WW.   Goal status: IN PROGRESS  4.  Patient will increase BLE gross strength to 4/5 as to improve functional strength for independent gait, increased standing tolerance and increased ADL ability. Baseline: 3/5: grossly 2/5 LLE; 4//25/24: RLE grossly 4-/5, LLE grossly 2/5; 03/01/23 RLE grossly 4+/5 and LLE grossly 3+/5, exception 0/5 PF/DF (overall gross Bilat LE strength around 4-/5) 7/26: RLE 4+/5 except 5/5 knee extension. LLE: 4-/5 knee extension, hip adduction 4+/5, all others 3+/5 except ankle DF 2/5 and PF 2-/5  Goal status: PARTIALLY MET    ASSESSMENT:  CLINICAL IMPRESSION:  Session focused primarily on building endurance and strength for gait with new AFO donned. Pt able to follow up bouts of ambulation with multiple STS completed throughout session. Pt still having difficulty with STS and unable to complete without UE assist, may be due to new AFO donned this date and this altering pt mechanics.  The pt will continue to benefit from skilled therapy to address remaining deficits in order to improve overall QoL and return to PLOF.     OBJECTIVE IMPAIRMENTS: Abnormal gait, cardiopulmonary status limiting activity, decreased activity tolerance, decreased balance, decreased coordination, decreased endurance, decreased knowledge of use of DME, decreased mobility, difficulty walking, decreased ROM, decreased strength, impaired perceived functional ability, impaired flexibility, impaired UE functional use, improper body mechanics, postural dysfunction, obesity, and pain.   ACTIVITY LIMITATIONS: carrying, lifting, bending, sitting, standing, squatting, sleeping, stairs, transfers, bed mobility, bathing, toileting, dressing, reach over head, and caring for others  PARTICIPATION LIMITATIONS: meal prep, cleaning, laundry, interpersonal relationship, driving, shopping, community activity, and church  PERSONAL FACTORS: Age, Behavior pattern, Education, Fitness, Past/current experiences, Profession,  Sex, Time since onset of injury/illness/exacerbation, and 3+ comorbidities: DM with diabetic neuropathy, stroke, HTN, AKI, UTI  are also affecting patient's functional outcome.   REHAB POTENTIAL: Fair length of time since CVA  CLINICAL DECISION MAKING: Evolving/moderate complexity  EVALUATION COMPLEXITY: Moderate  PLAN:  PT FREQUENCY: 2x/week  PT DURATION: 12 weeks  PLANNED INTERVENTIONS: Therapeutic exercises, Therapeutic activity, Neuromuscular re-education, Balance training, Gait training, Patient/Family education, Self Care, Joint mobilization, Stair training, Vestibular training, Canalith repositioning, Visual/preceptual remediation/compensation, Orthotic/Fit training, DME instructions, Cognitive remediation, Electrical stimulation, Wheelchair mobility training, Spinal mobilization, Cryotherapy, Moist heat, Splintting, Taping, Traction, Ultrasound, Manual therapy, and Re-evaluation  PLAN FOR NEXT SESSION:   stand in rolling walker, work on walking, strengthening LLE, balance  Norman Herrlich PT ,DPT Physical Therapist- [place]  [facility]   8:21 AM 05/08/23

## 2023-05-10 ENCOUNTER — Ambulatory Visit: Payer: 59

## 2023-05-10 ENCOUNTER — Encounter: Payer: 59 | Admitting: Occupational Therapy

## 2023-05-11 ENCOUNTER — Ambulatory Visit: Payer: 59

## 2023-05-12 ENCOUNTER — Ambulatory Visit: Payer: 59 | Admitting: Physical Therapy

## 2023-05-12 ENCOUNTER — Telehealth: Payer: Self-pay | Admitting: Physical Therapy

## 2023-05-12 NOTE — Telephone Encounter (Signed)
Pt contacted via telephone and author left voice mail informing of missed appointment and informed pt of future PT appointment date and time.   Christopher Byrd PT, DPT   

## 2023-05-12 NOTE — Therapy (Deleted)
OUTPATIENT PHYSICAL THERAPY TREATMENT     Patient Name: Dana Lucas MRN: [id number] DOB:[birth year], 72 y.o., female Today's Date: 05/12/2023   PCP: Bobbye Morton MD REFERRING PROVIDER: Louis Matte MD   END OF SESSION:                 Past Medical History:  Diagnosis Date   Diabetes mellitus without complication (HCC)    Hypertension    Stroke [facility])    Past Surgical History:  Procedure Laterality Date   ABDOMINAL HYSTERECTOMY     COLONOSCOPY N/A 10/06/2019   Procedure: COLONOSCOPY;  Surgeon: Toledo, Boykin Nearing, MD;  Location: [facility] ENDOSCOPY;  Service: Gastroenterology;  Laterality: N/A;   Patient Active Problem List   Diagnosis Date Noted   Hypertension    Diabetes mellitus without complication (HCC)    Stroke (HCC)    GIB (gastrointestinal bleeding)    AKI (acute kidney injury) (HCC)    UTI (urinary tract infection)     ONSET DATE: 2020  REFERRING DIAG: 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   THERAPY DIAG:  Difficulty in walking, not elsewhere classified  Unsteadiness on feet  Muscle weakness (generalized)  Rationale for Evaluation and Treatment: Rehabilitation  SUBJECTIVE:                                                                                                                                                                                             SUBJECTIVE STATEMENT:   Pt reports doing well today. Pt denies any recent falls/stumbles since prior session. Pt presents with new AFO and has PT assist with donning it at beginning of session.    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 Stroke was four years ago, has had multiple strokes. Had a little bit of therapy, afterwards in [place]. Moved up to [place] about three years ago. [name] includes DM with diabetic neuropathy, stroke, HTN,  AKI, UTI. Uses a walker to walk to the bathroom (two wheel and three wheel walkers).  Has a power chair for out of the house.   PAIN:  Are you having pain? Yes Right hand    PRECAUTIONS: Fall  WEIGHT BEARING RESTRICTIONS: No  FALLS: Has patient fallen in last 6 months? No  LIVING ENVIRONMENT: Lives with: lives with their family Lives in: House/apartment Stairs:  ramp outside Has following equipment at home: [name] - 2 wheeled, Environmental consultant - 4 wheeled, Wheelchair (power), shower chair, Ramped entry, and chair lift and hospital bed   PLOF: Independent  PATIENT GOALS: get up and walk by herself, wants to walk with a cane.   OBJECTIVE:   TODAY'S TREATMENT: DATE: 05/12/23 At start of session assisted pt with donning new AFO device and donning different pair of shoes.  TE: With gait belt donned throughout: Gait for strength/endurance x 75 ft with RW  and close CGA STS 5x from power chair, assistance required for first few reps Gait for strength/endurance 75 ft with RW and close CGA  STS 5x Gait for strength/endurance x85 ft with RW from cSTS 5 x  10 x LAQ 10 x gluteal press down  Ambulation with walker to WC ( 12 ft) and transfer to [facility] to end session     PATIENT EDUCATION: Education details: Pt educated throughout session about proper posture and technique with exercises. Improved exercise technique, movement at target joints, use of target muscles after min to mod verbal, visual, tactile cues.    HOME EXERCISE PROGRAM: continue HEP as previously given Access Code: [email or web address] URL: [email or web address] Date: 12/06/2022 Prepared by: Precious Bard  Exercises - Leg Extension  - 1 x daily - 7 x weekly - 2 sets - 10 reps - 5 hold - Seated March  - 1 x daily - 7 x weekly - 2 sets - 10 reps - 5 hold - Seated Hip Abduction  - 1 x daily - 7 x weekly - 2 sets - 10 reps - 5 hold - Seated Hip Adduction Isometrics with Ball  - 1 x daily - 7 x weekly - 2 sets - 10 reps - 5  hold - Seated Weight Shifting Without Arm Support  - 1 x daily - 7 x weekly - 2 sets - 10 reps - 5 hold   GOALS: Goals reviewed with patient? Yes  SHORT TERM GOALS: Target date: 01/03/2023  Patient will be independent in home exercise program to improve strength/mobility for better functional independence with ADLs. Baseline: 01/26/2023: pt has some difficulty with HEP Goal status: IN PROGRESS    LONG TERM GOALS: Target date: 02/28/2023  Patient will increase FOTO score to equal to or greater than 53    to demonstrate statistically significant improvement in mobility and quality of life.  Baseline: 3/5: 46%; 01/26/23: 45% ; 03/01/23: 42% 04/28/23: 52.5  Goal status: IN PROGRESS  2.  Patient (> 93 years old) will complete five times sit to stand test in < 15 seconds indicating an increased LE strength and improved balance. Baseline: 3/5: 38 seconds with SUE support; blocking of LLE; 01/26/23 18 seconds with pulling to stand; 03/01/23: 17 seconds pull to stand  04/28/23. 21.27(average of 2 trials) BUE on RW and CGA from PT  Goal status: IN PROGRESS  3.  Patient will increase 10 meter walk test to >1.30m/s as to improve gait speed for better community ambulation and to reduce fall risk Baseline: 3/5: 1 min 43 seconds with RW and w/c follow; 01/26/2023: 2 min 6 seconds; 03/01/23: 1 min 11 sec, 0.14 m/s with WC follow and 2WW. 04/28/23: 56 sec (average of 2 trials) With 2WW.   Goal status: IN PROGRESS  4.  Patient will increase BLE gross strength to 4/5 as to improve functional strength for independent gait, increased standing tolerance and increased ADL ability. Baseline: 3/5: grossly 2/5 LLE; 4//25/24: RLE grossly 4-/5, LLE grossly 2/5; 03/01/23 RLE grossly 4+/5 and LLE grossly 3+/5, exception 0/5 PF/DF (overall gross Bilat LE strength around 4-/5) 7/26: RLE 4+/5 except 5/5 knee extension. LLE: 4-/5 knee extension, hip adduction  4+/5, all others 3+/5 except ankle DF 2/5 and PF 2-/5  Goal status:  PARTIALLY MET    ASSESSMENT:  CLINICAL IMPRESSION:  Session focused primarily on building endurance and strength for gait with new AFO donned. Pt able to follow up bouts of ambulation with multiple STS completed throughout session. Pt still having difficulty with STS and unable to complete without UE assist, may be due to new AFO donned this date and this altering pt mechanics.  The pt will continue to benefit from skilled therapy to address remaining deficits in order to improve overall QoL and return to PLOF.     OBJECTIVE IMPAIRMENTS: Abnormal gait, cardiopulmonary status limiting activity, decreased activity tolerance, decreased balance, decreased coordination, decreased endurance, decreased knowledge of use of DME, decreased mobility, difficulty walking, decreased ROM, decreased strength, impaired perceived functional ability, impaired flexibility, impaired UE functional use, improper body mechanics, postural dysfunction, obesity, and pain.   ACTIVITY LIMITATIONS: carrying, lifting, bending, sitting, standing, squatting, sleeping, stairs, transfers, bed mobility, bathing, toileting, dressing, reach over head, and caring for others  PARTICIPATION LIMITATIONS: meal prep, cleaning, laundry, interpersonal relationship, driving, shopping, community activity, and church  PERSONAL FACTORS: Age, Behavior pattern, Education, Fitness, Past/current experiences, Profession, Sex, Time since onset of injury/illness/exacerbation, and 3+ comorbidities: DM with diabetic neuropathy, stroke, HTN, AKI, UTI  are also affecting patient's functional outcome.   REHAB POTENTIAL: Fair length of time since CVA  CLINICAL DECISION MAKING: Evolving/moderate complexity  EVALUATION COMPLEXITY: Moderate  PLAN:  PT FREQUENCY: 2x/week  PT DURATION: 12 weeks  PLANNED INTERVENTIONS: Therapeutic exercises, Therapeutic activity, Neuromuscular re-education, Balance training, Gait training, Patient/Family education, Self  Care, Joint mobilization, Stair training, Vestibular training, Canalith repositioning, Visual/preceptual remediation/compensation, Orthotic/Fit training, DME instructions, Cognitive remediation, Electrical stimulation, Wheelchair mobility training, Spinal mobilization, Cryotherapy, Moist heat, Splintting, Taping, Traction, Ultrasound, Manual therapy, and Re-evaluation  PLAN FOR NEXT SESSION:   stand in rolling walker, work on walking, strengthening LLE, balance  Norman Herrlich PT ,DPT Physical Therapist- [facility]  [facility]   8:04 AM 05/12/23

## 2023-05-15 ENCOUNTER — Ambulatory Visit: Payer: 59 | Admitting: Physical Therapy

## 2023-05-15 ENCOUNTER — Encounter: Payer: 59 | Admitting: Occupational Therapy

## 2023-05-15 DIAGNOSIS — M6281 Muscle weakness (generalized): Secondary | ICD-10-CM

## 2023-05-15 DIAGNOSIS — R2681 Unsteadiness on feet: Secondary | ICD-10-CM

## 2023-05-15 DIAGNOSIS — R262 Difficulty in walking, not elsewhere classified: Secondary | ICD-10-CM | POA: Diagnosis not present

## 2023-05-15 NOTE — Therapy (Signed)
OUTPATIENT PHYSICAL THERAPY TREATMENT     Patient Name: Dana Lucas MRN: [id number] DOB:[birth year], 72 y.o., female Today's Date: 05/15/2023   PCP: Bobbye Morton MD REFERRING PROVIDER: Louis Matte MD   END OF SESSION:  PT End of Session - 05/15/23 0757     Visit Number 33    Number of Visits 38    Date for PT Re-Evaluation 05/24/23    Authorization Type UHC Medicare; [place] Medicaid    Authorization Time Period 03/01/23-05/24/23    Progress Note Due on Visit 40    PT Start Time 0800    PT Stop Time 0844    PT Time Calculation (min) 44 min    Equipment Utilized During Treatment Gait belt    Activity Tolerance Patient tolerated treatment well;Patient limited by fatigue    Behavior During Therapy WFL for tasks assessed/performed                           Past Medical History:  Diagnosis Date   Diabetes mellitus without complication (HCC)    Hypertension    Stroke [facility])    Past Surgical History:  Procedure Laterality Date   ABDOMINAL HYSTERECTOMY     COLONOSCOPY N/A 10/06/2019   Procedure: COLONOSCOPY;  Surgeon: Toledo, Boykin Nearing, MD;  Location: [facility] ENDOSCOPY;  Service: Gastroenterology;  Laterality: N/A;   Patient Active Problem List   Diagnosis Date Noted   Hypertension    Diabetes mellitus without complication (HCC)    Stroke (HCC)    GIB (gastrointestinal bleeding)    AKI (acute kidney injury) (HCC)    UTI (urinary tract infection)     ONSET DATE: 2020  REFERRING DIAG: 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   THERAPY DIAG:  Difficulty in walking, not elsewhere classified  Unsteadiness on feet  Muscle weakness (generalized)  Rationale for Evaluation and Treatment: Rehabilitation  SUBJECTIVE:                                                                                                                                                                                             SUBJECTIVE  STATEMENT:   Pt reports doing well today. Pt denies any recent falls/stumbles since prior session. Pt is seeing PCP on Wednesday but report no new concerns to bring up with them.    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 Stroke was four years ago, has had multiple strokes. Had a little bit of therapy, afterwards in [place]. Moved  up to [place] about three years ago. [name] includes DM with diabetic neuropathy, stroke, HTN, AKI, UTI. Uses a walker to walk to the bathroom (two wheel and three wheel walkers).  Has a power chair for out of the house.   PAIN:  Are you having pain? Yes Right hand    PRECAUTIONS: Fall  WEIGHT BEARING RESTRICTIONS: No  FALLS: Has patient fallen in last 6 months? No  LIVING ENVIRONMENT: Lives with: lives with their family Lives in: House/apartment Stairs:  ramp outside Has following equipment at home: [name] - 2 wheeled, Environmental consultant - 4 wheeled, Wheelchair (power), shower chair, Ramped entry, and chair lift and hospital bed   PLOF: Independent  PATIENT GOALS: get up and walk by herself, wants to walk with a cane.   OBJECTIVE:   TODAY'S TREATMENT: DATE: 05/15/23 At start of session assisted pt with donning new AFO device and donning different pair of shoes.  TA: With gait belt donned throughout: Gait  x 117 ft with RW  and close CGA, weaving between obstacles to work on change of direction and pt has to react to this from cues form PT.   STS 5x from standard  armchair with airex pad in seat, no UE support throughout movement with cues to shift weight to the L side and cues for R LE placement when movement occurred between reps.   Gait  x 117 ft with RW  and close CGA, weaving between obstacles to work on change of direction and pt has to react to this from cues form PT.    STS 5x from standard  armchair with airex pad in seat, no UE support throughout  movement with cues to shift weight to the L side and cues for R LE placement when movement occurred between reps.   Gait  x 150 ft with RW  and close CGA rest break at 70 ft to use restroom ( 4 min of time unbilled here) and whtn ambulates back 80 ft  TE 10 x gluteal press down  10 x hipp adductor against YTB held by PT 10 x hip abduction YTB ( bilateral with focus on the R)   STS from chair with blue airex in seat then Ambulation with walker to WC ( 6 ft) and transfer to [facility] to end session     PATIENT EDUCATION: Education details: Pt educated throughout session about proper posture and technique with exercises. Improved exercise technique, movement at target joints, use of target muscles after min to mod verbal, visual, tactile cues.    HOME EXERCISE PROGRAM: continue HEP as previously given Access Code: [email or web address] URL: [email or web address] Date: 12/06/2022 Prepared by: Precious Bard  Exercises - Leg Extension  - 1 x daily - 7 x weekly - 2 sets - 10 reps - 5 hold - Seated March  - 1 x daily - 7 x weekly - 2 sets - 10 reps - 5 hold - Seated Hip Abduction  - 1 x daily - 7 x weekly - 2 sets - 10 reps - 5 hold - Seated Hip Adduction Isometrics with Ball  - 1 x daily - 7 x weekly - 2 sets - 10 reps - 5 hold - Seated Weight Shifting Without Arm Support  - 1 x daily - 7 x weekly - 2 sets - 10 reps - 5 hold   GOALS: Goals reviewed with patient? Yes  SHORT TERM GOALS: Target date: 01/03/2023  Patient will be independent in home exercise program to improve strength/mobility  for better functional independence with ADLs. Baseline: 01/26/2023: pt has some difficulty with HEP Goal status: IN PROGRESS    LONG TERM GOALS: Target date: 02/28/2023  Patient will increase FOTO score to equal to or greater than 53    to demonstrate statistically significant improvement in mobility and quality of life.  Baseline: 3/5: 46%; 01/26/23: 45% ; 03/01/23: 42% 04/28/23: 52.5  Goal status: IN  PROGRESS  2.  Patient (> 80 years old) will complete five times sit to stand test in < 15 seconds indicating an increased LE strength and improved balance. Baseline: 3/5: 38 seconds with SUE support; blocking of LLE; 01/26/23 18 seconds with pulling to stand; 03/01/23: 17 seconds pull to stand  04/28/23. 21.27(average of 2 trials) BUE on RW and CGA from PT  Goal status: IN PROGRESS  3.  Patient will increase 10 meter walk test to >1.63m/s as to improve gait speed for better community ambulation and to reduce fall risk Baseline: 3/5: 1 min 43 seconds with RW and w/c follow; 01/26/2023: 2 min 6 seconds; 03/01/23: 1 min 11 sec, 0.14 m/s with WC follow and 2WW. 04/28/23: 56 sec (average of 2 trials) With 2WW.   Goal status: IN PROGRESS  4.  Patient will increase BLE gross strength to 4/5 as to improve functional strength for independent gait, increased standing tolerance and increased ADL ability. Baseline: 3/5: grossly 2/5 LLE; 4//25/24: RLE grossly 4-/5, LLE grossly 2/5; 03/01/23 RLE grossly 4+/5 and LLE grossly 3+/5, exception 0/5 PF/DF (overall gross Bilat LE strength around 4-/5) 7/26: RLE 4+/5 except 5/5 knee extension. LLE: 4-/5 knee extension, hip adduction 4+/5, all others 3+/5 except ankle DF 2/5 and PF 2-/5  Goal status: PARTIALLY MET    ASSESSMENT:  CLINICAL IMPRESSION:   Session focused primarily on building endurance and strength for gait with new AFO donned. Pt able to follow up bouts of ambulation with multiple STS completed throughout session. Pt shows improved mechanics and form with STS for airex pad. Pt showing improved gait without LOB with new AFO.   The pt will continue to benefit from skilled therapy to address remaining deficits in order to improve overall QoL and return to PLOF.     OBJECTIVE IMPAIRMENTS: Abnormal gait, cardiopulmonary status limiting activity, decreased activity tolerance, decreased balance, decreased coordination, decreased endurance, decreased knowledge of  use of DME, decreased mobility, difficulty walking, decreased ROM, decreased strength, impaired perceived functional ability, impaired flexibility, impaired UE functional use, improper body mechanics, postural dysfunction, obesity, and pain.   ACTIVITY LIMITATIONS: carrying, lifting, bending, sitting, standing, squatting, sleeping, stairs, transfers, bed mobility, bathing, toileting, dressing, reach over head, and caring for others  PARTICIPATION LIMITATIONS: meal prep, cleaning, laundry, interpersonal relationship, driving, shopping, community activity, and church  PERSONAL FACTORS: Age, Behavior pattern, Education, Fitness, Past/current experiences, Profession, Sex, Time since onset of injury/illness/exacerbation, and 3+ comorbidities: DM with diabetic neuropathy, stroke, HTN, AKI, UTI  are also affecting patient's functional outcome.   REHAB POTENTIAL: Fair length of time since CVA  CLINICAL DECISION MAKING: Evolving/moderate complexity  EVALUATION COMPLEXITY: Moderate  PLAN:  PT FREQUENCY: 2x/week  PT DURATION: 12 weeks  PLANNED INTERVENTIONS: Therapeutic exercises, Therapeutic activity, Neuromuscular re-education, Balance training, Gait training, Patient/Family education, Self Care, Joint mobilization, Stair training, Vestibular training, Canalith repositioning, Visual/preceptual remediation/compensation, Orthotic/Fit training, DME instructions, Cognitive remediation, Electrical stimulation, Wheelchair mobility training, Spinal mobilization, Cryotherapy, Moist heat, Splintting, Taping, Traction, Ultrasound, Manual therapy, and Re-evaluation  PLAN FOR NEXT SESSION:   stand in rolling walker, work on  walking, strengthening LLE, balance  Norman Herrlich PT ,DPT Physical Therapist- [facility]   9:05 AM 05/15/23

## 2023-05-19 ENCOUNTER — Ambulatory Visit: Payer: 59 | Admitting: Physical Therapy

## 2023-05-19 ENCOUNTER — Ambulatory Visit: Payer: 59

## 2023-05-19 DIAGNOSIS — R278 Other lack of coordination: Secondary | ICD-10-CM

## 2023-05-19 DIAGNOSIS — R262 Difficulty in walking, not elsewhere classified: Secondary | ICD-10-CM | POA: Diagnosis not present

## 2023-05-19 DIAGNOSIS — R2681 Unsteadiness on feet: Secondary | ICD-10-CM

## 2023-05-19 DIAGNOSIS — M6281 Muscle weakness (generalized): Secondary | ICD-10-CM

## 2023-05-19 NOTE — Therapy (Signed)
OUTPATIENT PHYSICAL THERAPY TREATMENT     Patient Name: Dana Lucas MRN: [id number] DOB:20-Apr-1951, 72 y.o., female Today's Date: 05/22/2023   PCP: Bobbye Morton MD REFERRING PROVIDER: Louis Matte MD   END OF SESSION:  PT End of Session - 05/21/23 0835     Visit Number 34    Number of Visits 38    Date for PT Re-Evaluation 05/24/23    Authorization Type UHC Medicare; [place] Medicaid    Authorization Time Period 03/01/23-05/24/23    Progress Note Due on Visit 40    PT Start Time 0930    PT Stop Time 1014    PT Time Calculation (min) 44 min    Equipment Utilized During Treatment Gait belt    Activity Tolerance Patient tolerated treatment well;Patient limited by fatigue    Behavior During Therapy WFL for tasks assessed/performed                           Past Medical History:  Diagnosis Date   Diabetes mellitus without complication (HCC)    Hypertension    Stroke [facility])    Past Surgical History:  Procedure Laterality Date   ABDOMINAL HYSTERECTOMY     COLONOSCOPY N/A 10/06/2019   Procedure: COLONOSCOPY;  Surgeon: Toledo, Boykin Nearing, MD;  Location: [facility] ENDOSCOPY;  Service: Gastroenterology;  Laterality: N/A;   Patient Active Problem List   Diagnosis Date Noted   Hypertension    Diabetes mellitus without complication (HCC)    Stroke (HCC)    GIB (gastrointestinal bleeding)    AKI (acute kidney injury) (HCC)    UTI (urinary tract infection)     ONSET DATE: 2020  REFERRING DIAG: 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   THERAPY DIAG:  Difficulty in walking, not elsewhere classified  Unsteadiness on feet  Muscle weakness (generalized)  Other lack of coordination  Rationale for Evaluation and Treatment: Rehabilitation  SUBJECTIVE:                                                                                                                                                                                              SUBJECTIVE STATEMENT:   Pt reports no new issues today and states doing well overall without any falls.    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 Stroke was four years ago, has had multiple strokes. Had a little bit of therapy, afterwards in [place]. Moved up to [place] about three years ago. [name] includes  DM with diabetic neuropathy, stroke, HTN, AKI, UTI. Uses a walker to walk to the bathroom (two wheel and three wheel walkers).  Has a power chair for out of the house.   PAIN:  Are you having pain? Yes Right hand    PRECAUTIONS: Fall  WEIGHT BEARING RESTRICTIONS: No  FALLS: Has patient fallen in last 6 months? No  LIVING ENVIRONMENT: Lives with: lives with their family Lives in: House/apartment Stairs:  ramp outside Has following equipment at home: [name] - 2 wheeled, Environmental consultant - 4 wheeled, Wheelchair (power), shower chair, Ramped entry, and chair lift and hospital bed   PLOF: Independent  PATIENT GOALS: get up and walk by herself, wants to walk with a cane.   OBJECTIVE:   TODAY'S TREATMENT: DATE: 05/19/2023 Patient having issues with new AFO and unable to use and has to make another appt to have it inspected.   TA: With gait belt donned throughout:  Transfer from power w/c to mat- CGA with VC for pivot steps.   TE 10 x lift bottom and scoot out to edge then lift up and scoot back 2 x 10 hip adductor manual resistance by PT 10 reps hip abduction manual resistance by PT- with focus on the LLE 2 sets of 10 reps with lifting left LE up/over pool noodle  STS from chair with RUE Support x 10 STS from standard chair without UE support x 6 reps (patient reports as very hard)    PATIENT EDUCATION: Education details: Pt educated throughout session about proper posture and technique with exercises. Improved exercise technique, movement at target joints,  use of target muscles after min to mod verbal, visual, tactile cues.    HOME EXERCISE PROGRAM: continue HEP as previously given Access Code: [email or web address] URL: [email or web address] Date: 12/06/2022 Prepared by: Precious Bard  Exercises - Leg Extension  - 1 x daily - 7 x weekly - 2 sets - 10 reps - 5 hold - Seated March  - 1 x daily - 7 x weekly - 2 sets - 10 reps - 5 hold - Seated Hip Abduction  - 1 x daily - 7 x weekly - 2 sets - 10 reps - 5 hold - Seated Hip Adduction Isometrics with Ball  - 1 x daily - 7 x weekly - 2 sets - 10 reps - 5 hold - Seated Weight Shifting Without Arm Support  - 1 x daily - 7 x weekly - 2 sets - 10 reps - 5 hold   GOALS: Goals reviewed with patient? Yes  SHORT TERM GOALS: Target date: 01/03/2023  Patient will be independent in home exercise program to improve strength/mobility for better functional independence with ADLs. Baseline: 01/26/2023: pt has some difficulty with HEP Goal status: IN PROGRESS    LONG TERM GOALS: Target date: 02/28/2023  Patient will increase FOTO score to equal to or greater than 53    to demonstrate statistically significant improvement in mobility and quality of life.  Baseline: 3/5: 46%; 01/26/23: 45% ; 03/01/23: 42% 04/28/23: 52.5  Goal status: IN PROGRESS  2.  Patient (> 12 years old) will complete five times sit to stand test in < 15 seconds indicating an increased LE strength and improved balance. Baseline: 3/5: 38 seconds with SUE support; blocking of LLE; 01/26/23 18 seconds with pulling to stand; 03/01/23: 17 seconds pull to stand  04/28/23. 21.27(average of 2 trials) BUE on RW and CGA from PT  Goal status: IN PROGRESS  3.  Patient will increase 10 meter walk  test to >1.82m/s as to improve gait speed for better community ambulation and to reduce fall risk Baseline: 3/5: 1 min 43 seconds with RW and w/c follow; 01/26/2023: 2 min 6 seconds; 03/01/23: 1 min 11 sec, 0.14 m/s with WC follow and 2WW. 04/28/23: 56 sec  (average of 2 trials) With 2WW.   Goal status: IN PROGRESS  4.  Patient will increase BLE gross strength to 4/5 as to improve functional strength for independent gait, increased standing tolerance and increased ADL ability. Baseline: 3/5: grossly 2/5 LLE; 4//25/24: RLE grossly 4-/5, LLE grossly 2/5; 03/01/23 RLE grossly 4+/5 and LLE grossly 3+/5, exception 0/5 PF/DF (overall gross Bilat LE strength around 4-/5) 7/26: RLE 4+/5 except 5/5 knee extension. LLE: 4-/5 knee extension, hip adduction 4+/5, all others 3+/5 except ankle DF 2/5 and PF 2-/5  Goal status: PARTIALLY MET    ASSESSMENT:  CLINICAL IMPRESSION:   Patient presents unable to use AFO today so concentrated more on general LE strengthening. She performed well overall - VC for specific technique. She was also able to stand without use of BUE Support today.  The pt will continue to benefit from skilled therapy to address remaining deficits in order to improve overall QoL and return to PLOF.     OBJECTIVE IMPAIRMENTS: Abnormal gait, cardiopulmonary status limiting activity, decreased activity tolerance, decreased balance, decreased coordination, decreased endurance, decreased knowledge of use of DME, decreased mobility, difficulty walking, decreased ROM, decreased strength, impaired perceived functional ability, impaired flexibility, impaired UE functional use, improper body mechanics, postural dysfunction, obesity, and pain.   ACTIVITY LIMITATIONS: carrying, lifting, bending, sitting, standing, squatting, sleeping, stairs, transfers, bed mobility, bathing, toileting, dressing, reach over head, and caring for others  PARTICIPATION LIMITATIONS: meal prep, cleaning, laundry, interpersonal relationship, driving, shopping, community activity, and church  PERSONAL FACTORS: Age, Behavior pattern, Education, Fitness, Past/current experiences, Profession, Sex, Time since onset of injury/illness/exacerbation, and 3+ comorbidities: DM with diabetic  neuropathy, stroke, HTN, AKI, UTI  are also affecting patient's functional outcome.   REHAB POTENTIAL: Fair length of time since CVA  CLINICAL DECISION MAKING: Evolving/moderate complexity  EVALUATION COMPLEXITY: Moderate  PLAN:  PT FREQUENCY: 2x/week  PT DURATION: 12 weeks  PLANNED INTERVENTIONS: Therapeutic exercises, Therapeutic activity, Neuromuscular re-education, Balance training, Gait training, Patient/Family education, Self Care, Joint mobilization, Stair training, Vestibular training, Canalith repositioning, Visual/preceptual remediation/compensation, Orthotic/Fit training, DME instructions, Cognitive remediation, Electrical stimulation, Wheelchair mobility training, Spinal mobilization, Cryotherapy, Moist heat, Splintting, Taping, Traction, Ultrasound, Manual therapy, and Re-evaluation  PLAN FOR NEXT SESSION:   stand in rolling walker, work on walking, strengthening LLE, balance  Lenda Kelp PT  Physical Therapist- [place]  [facility]   8:36 AM 05/22/23

## 2023-05-22 ENCOUNTER — Encounter: Payer: 59 | Admitting: Occupational Therapy

## 2023-05-22 ENCOUNTER — Ambulatory Visit: Payer: 59 | Admitting: Physical Therapy

## 2023-05-24 ENCOUNTER — Ambulatory Visit: Payer: 59

## 2023-05-24 DIAGNOSIS — R2681 Unsteadiness on feet: Secondary | ICD-10-CM

## 2023-05-24 DIAGNOSIS — R262 Difficulty in walking, not elsewhere classified: Secondary | ICD-10-CM | POA: Diagnosis not present

## 2023-05-24 DIAGNOSIS — M6281 Muscle weakness (generalized): Secondary | ICD-10-CM

## 2023-05-24 DIAGNOSIS — R278 Other lack of coordination: Secondary | ICD-10-CM

## 2023-05-24 NOTE — Therapy (Signed)
OUTPATIENT PHYSICAL THERAPY TREATMENT     Patient Name: Dana Lucas MRN: [id number] DOB:17-Feb-1951, 72 y.o., female Today's Date: 05/24/2023   PCP: Bobbye Morton MD REFERRING PROVIDER: Louis Matte MD   END OF SESSION:  PT End of Session - 05/24/23 1023     Visit Number 35    Number of Visits 38    Date for PT Re-Evaluation 05/24/23    Authorization Type UHC Medicare; [place] Medicaid    Authorization Time Period 03/01/23-05/24/23    Progress Note Due on Visit 40    PT Start Time 0851    PT Stop Time 0927    PT Time Calculation (min) 36 min    Equipment Utilized During Treatment Gait belt    Activity Tolerance Patient tolerated treatment well;Patient limited by fatigue    Behavior During Therapy WFL for tasks assessed/performed                            Past Medical History:  Diagnosis Date   Diabetes mellitus without complication (HCC)    Hypertension    Stroke [facility])    Past Surgical History:  Procedure Laterality Date   ABDOMINAL HYSTERECTOMY     COLONOSCOPY N/A 10/06/2019   Procedure: COLONOSCOPY;  Surgeon: Toledo, Boykin Nearing, MD;  Location: [facility] ENDOSCOPY;  Service: Gastroenterology;  Laterality: N/A;   Patient Active Problem List   Diagnosis Date Noted   Hypertension    Diabetes mellitus without complication (HCC)    Stroke (HCC)    GIB (gastrointestinal bleeding)    AKI (acute kidney injury) (HCC)    UTI (urinary tract infection)     ONSET DATE: 2020  REFERRING DIAG: 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   THERAPY DIAG:  Muscle weakness (generalized)  Difficulty in walking, not elsewhere classified  Other lack of coordination  Unsteadiness on feet  Rationale for Evaluation and Treatment: Rehabilitation  SUBJECTIVE:                                                                                                                                                                                              SUBJECTIVE STATEMENT:   Pt reports wearing old AFO since new one broke. Has appointment with orthotist next month. Pt reports no other new issues, no falls.   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 Stroke was four years ago, has had multiple strokes. Had a little bit of therapy, afterwards in [place]. Moved up  to [place] about three years ago. [name] includes DM with diabetic neuropathy, stroke, HTN, AKI, UTI. Uses a walker to walk to the bathroom (two wheel and three wheel walkers).  Has a power chair for out of the house.   PAIN:  Are you having pain? Yes Right hand    PRECAUTIONS: Fall  WEIGHT BEARING RESTRICTIONS: No  FALLS: Has patient fallen in last 6 months? No  LIVING ENVIRONMENT: Lives with: lives with their family Lives in: House/apartment Stairs:  ramp outside Has following equipment at home: [name] - 2 wheeled, Environmental consultant - 4 wheeled, Wheelchair (power), shower chair, Ramped entry, and chair lift and hospital bed   PLOF: Independent  PATIENT GOALS: get up and walk by herself, wants to walk with a cane.   OBJECTIVE:   TODAY'S TREATMENT: DATE: 05/24/23   With gait belt donned throughout  TE Ambulation with RW for strength/endurance with one seated rest break (at 62 ft) for total of 105 ft.  Ambulation with RW for strength/endurance with one seated rest break (at 62 ft) for total of 105 ft - addition of ambulating around 4 cones and toe tap on 2 hedgehogs/stepping over 2 hedgehogs.   Ambulation with RW for strength/endurance with 2# AW donned each LE, addition of 4 toe taps on 6" step x 64 ft. Pt reports fatigue, requests rest break.  STS 6x throughout session with cuing for technique (scoot, anterior lean/reach/glute activation)  STS 5x in a row - continued cues for technique. Fatiguing.   Seated LAQ 10x each LE - Rates hard LLE   STS  2x    PATIENT EDUCATION: Education details: Pt educated throughout session about proper posture and technique with exercises. Improved exercise technique, movement at target joints, use of target muscles after min to mod verbal, visual, tactile cues.    HOME EXERCISE PROGRAM: continue HEP as previously given Access Code: [email or web address] URL: [email or web address] Date: 12/06/2022 Prepared by: Precious Bard  Exercises - Leg Extension  - 1 x daily - 7 x weekly - 2 sets - 10 reps - 5 hold - Seated March  - 1 x daily - 7 x weekly - 2 sets - 10 reps - 5 hold - Seated Hip Abduction  - 1 x daily - 7 x weekly - 2 sets - 10 reps - 5 hold - Seated Hip Adduction Isometrics with Ball  - 1 x daily - 7 x weekly - 2 sets - 10 reps - 5 hold - Seated Weight Shifting Without Arm Support  - 1 x daily - 7 x weekly - 2 sets - 10 reps - 5 hold   GOALS: Goals reviewed with patient? Yes  SHORT TERM GOALS: Target date: 01/03/2023  Patient will be independent in home exercise program to improve strength/mobility for better functional independence with ADLs. Baseline: 01/26/2023: pt has some difficulty with HEP Goal status: IN PROGRESS    LONG TERM GOALS: Target date: 02/28/2023  Patient will increase FOTO score to equal to or greater than 53    to demonstrate statistically significant improvement in mobility and quality of life.  Baseline: 3/5: 46%; 01/26/23: 45% ; 03/01/23: 42% 04/28/23: 52.5  Goal status: IN PROGRESS  2.  Patient (> 70 years old) will complete five times sit to stand test in < 15 seconds indicating an increased LE strength and improved balance. Baseline: 3/5: 38 seconds with SUE support; blocking of LLE; 01/26/23 18 seconds with pulling to stand; 03/01/23: 17 seconds pull to stand  04/28/23. 21.27(average  of 2 trials) BUE on RW and CGA from PT  Goal status: IN PROGRESS  3.  Patient will increase 10 meter walk test to >1.71m/s as to improve gait speed for better community ambulation  and to reduce fall risk Baseline: 3/5: 1 min 43 seconds with RW and w/c follow; 01/26/2023: 2 min 6 seconds; 03/01/23: 1 min 11 sec, 0.14 m/s with WC follow and 2WW. 04/28/23: 56 sec (average of 2 trials) With 2WW.   Goal status: IN PROGRESS  4.  Patient will increase BLE gross strength to 4/5 as to improve functional strength for independent gait, increased standing tolerance and increased ADL ability. Baseline: 3/5: grossly 2/5 LLE; 4//25/24: RLE grossly 4-/5, LLE grossly 2/5; 03/01/23 RLE grossly 4+/5 and LLE grossly 3+/5, exception 0/5 PF/DF (overall gross Bilat LE strength around 4-/5) 7/26: RLE 4+/5 except 5/5 knee extension. LLE: 4-/5 knee extension, hip adduction 4+/5, all others 3+/5 except ankle DF 2/5 and PF 2-/5  Goal status: PARTIALLY MET    ASSESSMENT:  CLINICAL IMPRESSION:  Focused on progressive gait exercises this visit to promote increased LE strength and endurance. Pt able to ambulate with 2# AW on, but did report earlier onset of fatigue. The pt will continue to benefit from skilled therapy to address remaining deficits in order to improve overall QoL and return to PLOF.     OBJECTIVE IMPAIRMENTS: Abnormal gait, cardiopulmonary status limiting activity, decreased activity tolerance, decreased balance, decreased coordination, decreased endurance, decreased knowledge of use of DME, decreased mobility, difficulty walking, decreased ROM, decreased strength, impaired perceived functional ability, impaired flexibility, impaired UE functional use, improper body mechanics, postural dysfunction, obesity, and pain.   ACTIVITY LIMITATIONS: carrying, lifting, bending, sitting, standing, squatting, sleeping, stairs, transfers, bed mobility, bathing, toileting, dressing, reach over head, and caring for others  PARTICIPATION LIMITATIONS: meal prep, cleaning, laundry, interpersonal relationship, driving, shopping, community activity, and church  PERSONAL FACTORS: Age, Behavior pattern,  Education, Fitness, Past/current experiences, Profession, Sex, Time since onset of injury/illness/exacerbation, and 3+ comorbidities: DM with diabetic neuropathy, stroke, HTN, AKI, UTI  are also affecting patient's functional outcome.   REHAB POTENTIAL: Fair length of time since CVA  CLINICAL DECISION MAKING: Evolving/moderate complexity  EVALUATION COMPLEXITY: Moderate  PLAN:  PT FREQUENCY: 2x/week  PT DURATION: 12 weeks  PLANNED INTERVENTIONS: Therapeutic exercises, Therapeutic activity, Neuromuscular re-education, Balance training, Gait training, Patient/Family education, Self Care, Joint mobilization, Stair training, Vestibular training, Canalith repositioning, Visual/preceptual remediation/compensation, Orthotic/Fit training, DME instructions, Cognitive remediation, Electrical stimulation, Wheelchair mobility training, Spinal mobilization, Cryotherapy, Moist heat, Splintting, Taping, Traction, Ultrasound, Manual therapy, and Re-evaluation  PLAN FOR NEXT SESSION:   stand in rolling walker, work on walking, strengthening LLE, balance  Baird Kay PT  Physical Therapist- [place]  [facility]   10:26 AM 05/24/23

## 2023-05-25 ENCOUNTER — Ambulatory Visit: Payer: 59 | Admitting: Physical Therapy

## 2023-05-25 DIAGNOSIS — M6281 Muscle weakness (generalized): Secondary | ICD-10-CM

## 2023-05-25 DIAGNOSIS — R262 Difficulty in walking, not elsewhere classified: Secondary | ICD-10-CM

## 2023-05-25 DIAGNOSIS — R278 Other lack of coordination: Secondary | ICD-10-CM

## 2023-05-25 DIAGNOSIS — R2681 Unsteadiness on feet: Secondary | ICD-10-CM

## 2023-05-25 NOTE — Therapy (Signed)
OUTPATIENT PHYSICAL THERAPY TREATMENT / Re-certification      Patient Name: Dana Lucas MRN: [id number] DOB:[birth year], 72 y.o., female Today's Date: 05/25/2023   PCP: Bobbye Morton MD REFERRING PROVIDER: Louis Matte MD   END OF SESSION:  PT End of Session - 05/25/23 0814     Visit Number 36    Number of Visits 60    Date for PT Re-Evaluation 08/17/23    Authorization Type UHC Medicare; [place] Medicaid    Authorization Time Period 03/01/23-05/24/23    Progress Note Due on Visit 40    PT Start Time 0803    PT Stop Time 0849    PT Time Calculation (min) 46 min    Equipment Utilized During Treatment Gait belt    Activity Tolerance Patient tolerated treatment well;Patient limited by fatigue    Behavior During Therapy WFL for tasks assessed/performed                            Past Medical History:  Diagnosis Date   Diabetes mellitus without complication (HCC)    Hypertension    Stroke [facility])    Past Surgical History:  Procedure Laterality Date   ABDOMINAL HYSTERECTOMY     COLONOSCOPY N/A 10/06/2019   Procedure: COLONOSCOPY;  Surgeon: Toledo, Boykin Nearing, MD;  Location: [facility] ENDOSCOPY;  Service: Gastroenterology;  Laterality: N/A;   Patient Active Problem List   Diagnosis Date Noted   Hypertension    Diabetes mellitus without complication (HCC)    Stroke (HCC)    GIB (gastrointestinal bleeding)    AKI (acute kidney injury) (HCC)    UTI (urinary tract infection)     ONSET DATE: 2020  REFERRING DIAG: 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   THERAPY DIAG:  Muscle weakness (generalized)  Difficulty in walking, not elsewhere classified  Other lack of coordination  Unsteadiness on feet  Rationale for Evaluation and Treatment: Rehabilitation  SUBJECTIVE:                                                                                                                                                                                              SUBJECTIVE STATEMENT:   Pt reports to PT  wearing old AFO since new one broke again on this day. Has appointment with orthotist next month. Pt reports no other new issues, no falls. Pt reports that she feels like she is definitely progressing, and want s to continue with PT services at this time. States that she wants to become more independent with tub transfers.  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 Stroke was four years ago, has had multiple strokes. Had a little bit of therapy, afterwards in [place]. Moved up to [place] about three years ago. [name] includes DM with diabetic neuropathy, stroke, HTN, AKI, UTI. Uses a walker to walk to the bathroom (two wheel and three wheel walkers).  Has a power chair for out of the house.   PAIN:  Are you having pain? Yes Right hand    PRECAUTIONS: Fall  WEIGHT BEARING RESTRICTIONS: No  FALLS: Has patient fallen in last 6 months? No  LIVING ENVIRONMENT: Lives with: lives with their family Lives in: House/apartment Stairs:  ramp outside Has following equipment at home: [name] - 2 wheeled, Environmental consultant - 4 wheeled, Wheelchair (power), shower chair, Ramped entry, and chair lift and hospital bed   PLOF: Independent  PATIENT GOALS: get up and walk by herself, wants to walk with a cane.   OBJECTIVE:    Pt surveys FOTO 54   TODAY'S TREATMENT: DATE: 05/25/23   With gait belt donned throughout  Nustep BLE and BUE reciprocal movement and endurance training x 4 min level 0-2 with cues for full ROM in BLE and BUE intermittently throughout   Pt performed 5 time sit<>stand (5xSTS): 17.4sec  ( >15 sec indicates increased fall risk)   2 min walk test with 2WW x 6ft with CGA for safety from PT.   10 Meter Walk Test: Patient instructed to walk 10 meters (32.8 ft) as quickly and as safely as possible at  their normal speed x2 and at a fast speed x2. Time measured from 2 meter mark to 8 meter mark to accommodate ramp-up and ramp-down.  Normal speed 1: 59sec  Normal speed 2: 55sec  Normal speed 2: 49sec  Average Normal speed: 54.3 sec   Cut off scores: <0.4 m/s = household Ambulator, 0.4-0.8 m/s = limited community Ambulator, >0.8 m/s = community Ambulator, >1.2 m/s = crossing a street, <1.0 = increased fall risk MCID 0.05 m/s (small), 0.13 m/s (moderate), 0.06 m/s (significant)  (ANPTA Core Set of Outcome Measures for Adults with Neurologic Conditions, 2018)     PATIENT EDUCATION: Education details: Pt educated throughout session about proper posture and technique with exercises. Improved exercise technique, movement at target joints, use of target muscles after min to mod verbal, visual, tactile cues.    HOME EXERCISE PROGRAM: continue HEP as previously given Access Code: [email or web address] URL: [email or web address] Date: 12/06/2022 Prepared by: Precious Bard  Exercises - Leg Extension  - 1 x daily - 7 x weekly - 2 sets - 10 reps - 5 hold - Seated March  - 1 x daily - 7 x weekly - 2 sets - 10 reps - 5 hold - Seated Hip Abduction  - 1 x daily - 7 x weekly - 2 sets - 10 reps - 5 hold - Seated Hip Adduction Isometrics with Ball  - 1 x daily - 7 x weekly - 2 sets - 10 reps - 5 hold - Seated Weight Shifting Without Arm Support  - 1 x daily - 7 x weekly - 2 sets - 10 reps - 5 hold   GOALS: Goals reviewed with patient? Yes  SHORT TERM GOALS: Target date: 06/29/2023    Patient will be independent in home exercise program to improve strength/mobility for better functional independence with ADLs. Baseline: 01/26/2023: pt has some difficulty with HEP Need  to reassess HEP Goal status: IN PROGRESS    LONG TERM GOALS: Target date: 08/17/2023    Patient will increase FOTO score to equal to or greater than 53    to demonstrate statistically significant improvement in mobility and  quality of life.  Baseline: 3/5: 46%; 01/26/23: 45% ; 03/01/23: 42% 04/28/23: 52.5  8/22 54 Goal status: MET  2.  Patient (> 21 years old) will complete five times sit to stand test in < 15 seconds indicating an increased LE strength and improved balance. Baseline: 3/5: 38 seconds with SUE support; blocking of LLE; 01/26/23 18 seconds with pulling to stand; 03/01/23: 17 seconds pull to stand  04/28/23. 21.27(average of 2 trials) BUE on RW and CGA from PT  8/21 17.4sec with BUE support on RW and CGA from PT   Goal status: IN PROGRESS  3.  Patient will increase 10 meter walk test to >1.19m/s as to improve gait speed for better community ambulation and to reduce fall risk Baseline: 3/5: 1 min 43 seconds with RW and w/c follow; 01/26/2023: 2 min 6 seconds; 03/01/23: 1 min 11 sec, 0.14 m/s with WC follow and 2WW. 04/28/23: 56 sec (average of 2 trials) With 2WW.   8/22: 54.3 sec (average of 3 trials) With 2WW.  Goal status: IN PROGRESS  4.  Patient will increase BLE gross strength to 4/5 as to improve functional strength for independent gait, increased standing tolerance and increased ADL ability. Baseline: 3/5: grossly 2/5 LLE; 4//25/24: RLE grossly 4-/5, LLE grossly 2/5; 03/01/23 RLE grossly 4+/5 and LLE grossly 3+/5, exception 0/5 PF/DF (overall gross Bilat LE strength around 4-/5) 7/26: RLE 4+/5 except 5/5 knee extension. LLE: 4-/5 knee extension, hip adduction 4+/5, all others 3+/5 except ankle DF 2/5 and PF 2-/5  8/22: LLE: hip flexion: 3+,hip adduction 4+, abduction 4, knee extension 4-(delayed activation), knee flexion 4-/5 delayed activation. DF/PF: 1  Goal status: PARTIALLY MET  5.  Patient will increase 2 min walk test by >61ft to indicate improved access to community and safety with gait through house.  Baseline: 52ft with RW, AFO, and CGA from PT Goal status: Initial and New.   ASSESSMENT:  CLINICAL IMPRESSION:  Pt presented to PT on this day motivated to participate. Reports that she has  been continuing to progress, and feels like she will continue to improve with continued PT. Pt demonstrates improved balance and functional gait with improved time on , reduced time on 5x STS, ability to  complete 2 min walk test, and increased FOTO score, meeting Goal of 54. Patient's condition has the potential to improve in response to therapy. Maximum improvement is yet to be obtained. The anticipated improvement is attainable and reasonable in a generally predictable time. The pt will continue to benefit from skilled therapy to address remaining deficits in order to improve overall QoL and return to PLOF.     OBJECTIVE IMPAIRMENTS: Abnormal gait, cardiopulmonary status limiting activity, decreased activity tolerance, decreased balance, decreased coordination, decreased endurance, decreased knowledge of use of DME, decreased mobility, difficulty walking, decreased ROM, decreased strength, impaired perceived functional ability, impaired flexibility, impaired UE functional use, improper body mechanics, postural dysfunction, obesity, and pain.   ACTIVITY LIMITATIONS: carrying, lifting, bending, sitting, standing, squatting, sleeping, stairs, transfers, bed mobility, bathing, toileting, dressing, reach over head, and caring for others  PARTICIPATION LIMITATIONS: meal prep, cleaning, laundry, interpersonal relationship, driving, shopping, community activity, and church  PERSONAL FACTORS: Age, Behavior pattern, Education, Fitness, Past/current experiences, Profession, Sex, Time since onset  of injury/illness/exacerbation, and 3+ comorbidities: DM with diabetic neuropathy, stroke, HTN, AKI, UTI  are also affecting patient's functional outcome.   REHAB POTENTIAL: Fair length of time since CVA  CLINICAL DECISION MAKING: Evolving/moderate complexity  EVALUATION COMPLEXITY: Moderate  PLAN:  PT FREQUENCY: 2x/week  PT DURATION: 12 weeks  PLANNED INTERVENTIONS: Therapeutic exercises, Therapeutic  activity, Neuromuscular re-education, Balance training, Gait training, Patient/Family education, Self Care, Joint mobilization, Stair training, Vestibular training, Canalith repositioning, Visual/preceptual remediation/compensation, Orthotic/Fit training, DME instructions, Cognitive remediation, Electrical stimulation, Wheelchair mobility training, Spinal mobilization, Cryotherapy, Moist heat, Splintting, Taping, Traction, Ultrasound, Manual therapy, and Re-evaluation  PLAN FOR NEXT SESSION:   stand in rolling walker, work on walking, strengthening LLE, balance  Golden Pop PT  Physical Therapist- [facility]  [facility]   10:56 AM 05/25/23

## 2023-05-26 ENCOUNTER — Ambulatory Visit: Payer: 59 | Admitting: Physical Therapy

## 2023-05-29 ENCOUNTER — Ambulatory Visit: Payer: 59

## 2023-05-29 ENCOUNTER — Encounter: Payer: 59 | Admitting: Occupational Therapy

## 2023-05-29 DIAGNOSIS — R262 Difficulty in walking, not elsewhere classified: Secondary | ICD-10-CM | POA: Diagnosis not present

## 2023-05-29 DIAGNOSIS — M6281 Muscle weakness (generalized): Secondary | ICD-10-CM

## 2023-05-29 DIAGNOSIS — R278 Other lack of coordination: Secondary | ICD-10-CM

## 2023-05-29 DIAGNOSIS — R2681 Unsteadiness on feet: Secondary | ICD-10-CM

## 2023-05-29 DIAGNOSIS — G8929 Other chronic pain: Secondary | ICD-10-CM

## 2023-05-29 NOTE — Therapy (Signed)
OUTPATIENT PHYSICAL THERAPY TREATMENT     Patient Name: Dana Lucas MRN: [id number] DOB:December 09, 1950, 72 y.o., female Today's Date: 05/29/2023   PCP: Bobbye Morton MD REFERRING PROVIDER: Louis Matte MD   END OF SESSION:  PT End of Session - 05/29/23 0902     Visit Number 37    Number of Visits 60    Date for PT Re-Evaluation 08/17/23    Authorization Type U[employer] Medicare; [place] Medicaid    Authorization Time Period 03/01/23-05/24/23    Progress Note Due on Visit 40    PT Start Time 0850    PT Stop Time 0929    PT Time Calculation (min) 39 min    Equipment Utilized During Treatment Gait belt    Activity Tolerance Patient tolerated treatment well;Patient limited by fatigue    Behavior During Therapy WFL for tasks assessed/performed                            Past Medical History:  Diagnosis Date   Diabetes mellitus without complication (HCC)    Hypertension    Stroke [facility])    Past Surgical History:  Procedure Laterality Date   ABDOMINAL HYSTERECTOMY     COLONOSCOPY N/A 10/06/2019   Procedure: COLONOSCOPY;  Surgeon: Toledo, Boykin Nearing, MD;  Location: [facility] ENDOSCOPY;  Service: Gastroenterology;  Laterality: N/A;   Patient Active Problem List   Diagnosis Date Noted   Hypertension    Diabetes mellitus without complication (HCC)    Stroke (HCC)    GIB (gastrointestinal bleeding)    AKI (acute kidney injury) (HCC)    UTI (urinary tract infection)     ONSET DATE: 2020  REFERRING DIAG: 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   THERAPY DIAG:  Muscle weakness (generalized)  Difficulty in walking, not elsewhere classified  Other lack of coordination  Unsteadiness on feet  Chronic pain of right knee  Rationale for Evaluation and Treatment: Rehabilitation  SUBJECTIVE:                                                                                                                                                                                              SUBJECTIVE STATEMENT:   Pt rhas no updates. Still using old AFO, scheduled to see prosthetist on 9/19 for fixing broken buckle.    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 Stroke was four years ago, has had multiple strokes. Had a little bit of therapy, afterwards in [place].  Moved up to [place] about three years ago. [name] includes DM with diabetic neuropathy, stroke, HTN, AKI, UTI. Uses a walker to walk to the bathroom (two wheel and three wheel walkers).  Has a power chair for out of the house.   PAIN:  Are you having pain? Yes Rt hand, Rt knee 5/10   PRECAUTIONS: Fall  WEIGHT BEARING RESTRICTIONS: No  FALLS: Has patient fallen in last 6 months? No  LIVING ENVIRONMENT: Lives with: lives with their family Lives in: House/apartment Stairs:  ramp outside Has following equipment at home: [name] - 2 wheeled, Environmental consultant - 4 wheeled, Wheelchair (power), shower chair, Ramped entry, and chair lift and hospital bed   PLOF: Independent  PATIENT GOALS: get up and walk by herself, wants to walk with a cane.   OBJECTIVE:     TODAY'S TREATMENT: DATE: 05/29/23  -squat pivot transfers minA to from [facility] x4 -lateral scoot transfers along plinth 2x 74ft bilat, rest between efforts -RLE LAQ 2x15, marching 1x15  -STS x fer and overground AMB c YRW 112ft, old AFO - STS xfer to overground AMB c YRW 38ft, old AFO  s PATIENT EDUCATION: Education details: Pt educated throughout session about proper posture and technique with exercises. Improved exercise technique, movement at target joints, use of target muscles after min to mod verbal, visual, tactile cues.    HOME EXERCISE PROGRAM: continue HEP as previously given Access Code: [email or web address] URL: [email or web address] Date: 12/06/2022 Prepared by: Precious Bard  Exercises - Leg  Extension  - 1 x daily - 7 x weekly - 2 sets - 10 reps - 5 hold - Seated March  - 1 x daily - 7 x weekly - 2 sets - 10 reps - 5 hold - Seated Hip Abduction  - 1 x daily - 7 x weekly - 2 sets - 10 reps - 5 hold - Seated Hip Adduction Isometrics with Ball  - 1 x daily - 7 x weekly - 2 sets - 10 reps - 5 hold - Seated Weight Shifting Without Arm Support  - 1 x daily - 7 x weekly - 2 sets - 10 reps - 5 hold   GOALS: Goals reviewed with patient? Yes  SHORT TERM GOALS: Target date: 06/29/2023    Patient will be independent in home exercise program to improve strength/mobility for better functional independence with ADLs. Baseline: 01/26/2023: pt has some difficulty with HEP Need to reassess HEP Goal status: IN PROGRESS    LONG TERM GOALS: Target date: 08/17/2023    Patient will increase FOTO score to equal to or greater than 53    to demonstrate statistically significant improvement in mobility and quality of life.  Baseline: 3/5: 46%; 01/26/23: 45% ; 03/01/23: 42% 04/28/23: 52.5  8/22 54 Goal status: MET  2.  Patient (> 63 years old) will complete five times sit to stand test in < 15 seconds indicating an increased LE strength and improved balance. Baseline: 3/5: 38 seconds with SUE support; blocking of LLE; 01/26/23 18 seconds with pulling to stand; 03/01/23: 17 seconds pull to stand  04/28/23. 21.27(average of 2 trials) BUE on RW and CGA from PT  8/21 17.4sec with BUE support on RW and CGA from PT   Goal status: IN PROGRESS  3.  Patient will increase 10 meter walk test to >1.78m/s as to improve gait speed for better community ambulation and to reduce fall risk Baseline: 3/5: 1 min 43 seconds with RW and w/c follow; 01/26/2023: 2 min 6 seconds;  03/01/23: 1 min 11 sec, 0.14 m/s with WC follow and 2WW. 04/28/23: 56 sec (average of 2 trials) With 2WW.   8/22: 54.3 sec (average of 3 trials) With 2WW.  Goal status: IN PROGRESS  4.  Patient will increase BLE gross strength to 4/5 as to improve  functional strength for independent gait, increased standing tolerance and increased ADL ability. Baseline: 3/5: grossly 2/5 LLE; 4//25/24: RLE grossly 4-/5, LLE grossly 2/5; 03/01/23 RLE grossly 4+/5 and LLE grossly 3+/5, exception 0/5 PF/DF (overall gross Bilat LE strength around 4-/5) 7/26: RLE 4+/5 except 5/5 knee extension. LLE: 4-/5 knee extension, hip adduction 4+/5, all others 3+/5 except ankle DF 2/5 and PF 2-/5  8/22: LLE: hip flexion: 3+,hip adduction 4+, abduction 4, knee extension 4-(delayed activation), knee flexion 4-/5 delayed activation. DF/PF: 1  Goal status: PARTIALLY MET  5.  Patient will increase 2 min walk test by >51ft to indicate improved access to community and safety with gait through house.  Baseline: 64ft with RW, AFO, and CGA from PT Goal status: Initial and New.   ASSESSMENT:  CLINICAL IMPRESSION:  Attention brought to transfers techniques and variability, endurance with these. Continued with overground gait training. Pt fatigues quickly, but recoverys quickly as well. Maximum improvement is yet to be obtained. The anticipated improvement is attainable and reasonable in a generally predictable time. The pt will continue to benefit from skilled therapy to address remaining deficits in order to improve overall QoL and return to PLOF.     OBJECTIVE IMPAIRMENTS: Abnormal gait, cardiopulmonary status limiting activity, decreased activity tolerance, decreased balance, decreased coordination, decreased endurance, decreased knowledge of use of DME, decreased mobility, difficulty walking, decreased ROM, decreased strength, impaired perceived functional ability, impaired flexibility, impaired UE functional use, improper body mechanics, postural dysfunction, obesity, and pain.   ACTIVITY LIMITATIONS: carrying, lifting, bending, sitting, standing, squatting, sleeping, stairs, transfers, bed mobility, bathing, toileting, dressing, reach over head, and caring for  others  PARTICIPATION LIMITATIONS: meal prep, cleaning, laundry, interpersonal relationship, driving, shopping, community activity, and church  PERSONAL FACTORS: Age, Behavior pattern, Education, Fitness, Past/current experiences, Profession, Sex, Time since onset of injury/illness/exacerbation, and 3+ comorbidities: DM with diabetic neuropathy, stroke, HTN, AKI, UTI  are also affecting patient's functional outcome.   REHAB POTENTIAL: Fair length of time since CVA  CLINICAL DECISION MAKING: Evolving/moderate complexity  EVALUATION COMPLEXITY: Moderate  PLAN:  PT FREQUENCY: 2x/week  PT DURATION: 12 weeks  PLANNED INTERVENTIONS: Therapeutic exercises, Therapeutic activity, Neuromuscular re-education, Balance training, Gait training, Patient/Family education, Self Care, Joint mobilization, Stair training, Vestibular training, Canalith repositioning, Visual/preceptual remediation/compensation, Orthotic/Fit training, DME instructions, Cognitive remediation, Electrical stimulation, Wheelchair mobility training, Spinal mobilization, Cryotherapy, Moist heat, Splintting, Taping, Traction, Ultrasound, Manual therapy, and Re-evaluation  PLAN FOR NEXT SESSION:  Advance gait training intervals/ volume    Rosamaria Lints PT  Physical Therapist- [facility] - [facility]   9:07 AM 05/29/23   9:07 AM, 05/29/23 Rosamaria Lints, PT, DPT Physical Therapist - [facility]  [phone] [facility])

## 2023-05-31 ENCOUNTER — Ambulatory Visit: Payer: 59 | Admitting: Physical Therapy

## 2023-05-31 DIAGNOSIS — M6281 Muscle weakness (generalized): Secondary | ICD-10-CM

## 2023-05-31 DIAGNOSIS — R278 Other lack of coordination: Secondary | ICD-10-CM

## 2023-05-31 DIAGNOSIS — R262 Difficulty in walking, not elsewhere classified: Secondary | ICD-10-CM | POA: Diagnosis not present

## 2023-05-31 DIAGNOSIS — G8929 Other chronic pain: Secondary | ICD-10-CM

## 2023-05-31 DIAGNOSIS — R2681 Unsteadiness on feet: Secondary | ICD-10-CM

## 2023-05-31 NOTE — Therapy (Signed)
OUTPATIENT PHYSICAL THERAPY TREATMENT     Patient Name: Dana Lucas MRN: [id number] DOB:10-Dec-1950, 72 y.o., female Today's Date: 05/31/2023   PCP: Bobbye Morton MD REFERRING PROVIDER: Louis Matte MD   END OF SESSION:  PT End of Session - 05/31/23 0759     Visit Number 38    Number of Visits 60    Date for PT Re-Evaluation 08/17/23    Authorization Type UHC Medicare; [place] Medicaid    Authorization Time Period 03/01/23-05/24/23    Progress Note Due on Visit 40    PT Start Time 0805    PT Stop Time 0845    PT Time Calculation (min) 40 min    Equipment Utilized During Treatment Gait belt    Activity Tolerance Patient tolerated treatment well;Patient limited by fatigue    Behavior During Therapy WFL for tasks assessed/performed                            Past Medical History:  Diagnosis Date   Diabetes mellitus without complication (HCC)    Hypertension    Stroke [facility])    Past Surgical History:  Procedure Laterality Date   ABDOMINAL HYSTERECTOMY     COLONOSCOPY N/A 10/06/2019   Procedure: COLONOSCOPY;  Surgeon: Toledo, Boykin Nearing, MD;  Location: [facility] ENDOSCOPY;  Service: Gastroenterology;  Laterality: N/A;   Patient Active Problem List   Diagnosis Date Noted   Hypertension    Diabetes mellitus without complication (HCC)    Stroke (HCC)    GIB (gastrointestinal bleeding)    AKI (acute kidney injury) (HCC)    UTI (urinary tract infection)     ONSET DATE: 2020  REFERRING DIAG: 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   THERAPY DIAG:  Muscle weakness (generalized)  Difficulty in walking, not elsewhere classified  Other lack of coordination  Unsteadiness on feet  Chronic pain of right knee  Rationale for Evaluation and Treatment: Rehabilitation  SUBJECTIVE:                                                                                                                                                                                              SUBJECTIVE STATEMENT:   Pt rhas no updates. Still using old AFO, scheduled to see prosthetist on 9/19 for fixing broken buckle.    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 Stroke was four years ago, has had multiple strokes. Had a little bit of therapy, afterwards in [place].  Moved up to [place] about three years ago. [name] includes DM with diabetic neuropathy, stroke, HTN, AKI, UTI. Uses a walker to walk to the bathroom (two wheel and three wheel walkers).  Has a power chair for out of the house.   PAIN:  Are you having pain? Yes Rt hand, Rt knee 5/10   PRECAUTIONS: Fall  WEIGHT BEARING RESTRICTIONS: No  FALLS: Has patient fallen in last 6 months? No  LIVING ENVIRONMENT: Lives with: lives with their family Lives in: House/apartment Stairs:  ramp outside Has following equipment at home: [name] - 2 wheeled, Environmental consultant - 4 wheeled, Wheelchair (power), shower chair, Ramped entry, and chair lift and hospital bed   PLOF: Independent  PATIENT GOALS: get up and walk by herself, wants to walk with a cane.   OBJECTIVE:     TODAY'S TREATMENT: DATE: 05/31/23  Nustep reciprocal movement training BLE and BUE x 4 min level 1-3 with cues for full ROM in BUE/BLE intermittently.   Stand pivot tranfers with RW to and from arm chair. CGA from PT and tactile cues for reduced GR on the LLE as well as improved step width as tolerated  Gait with RW through rehab gym performed 2 x 62ft, 5ft and to weave through 8 cones for 26ft. Tactile cues throughout treatment for reduced GR. Noted to have improved knee control to prevent snap back with increased repetitions and forced attention to HS activation to maintain "soft knee" in stance.   Blocked practice sit<>stand with PT blocking the LLE to force improved symmetry of WB through BLE 2x 6 with RUE pushing  from arm rest per pt preference.  Seated HS curl 2 x 10 with YTB. Reduced resistance required on the LLE to achieve full ROM.      PATIENT EDUCATION: Education details: Pt educated throughout session about proper posture and technique with exercises. Improved exercise technique, movement at target joints, use of target muscles after min to mod verbal, visual, tactile cues.  Importance of protection of L knee to prevent increased GR in stance.   HOME EXERCISE PROGRAM: continue HEP as previously given Access Code: [email or web address] URL: [email or web address] Date: 12/06/2022 Prepared by: Precious Bard  Exercises - Leg Extension  - 1 x daily - 7 x weekly - 2 sets - 10 reps - 5 hold - Seated March  - 1 x daily - 7 x weekly - 2 sets - 10 reps - 5 hold - Seated Hip Abduction  - 1 x daily - 7 x weekly - 2 sets - 10 reps - 5 hold - Seated Hip Adduction Isometrics with Ball  - 1 x daily - 7 x weekly - 2 sets - 10 reps - 5 hold - Seated Weight Shifting Without Arm Support  - 1 x daily - 7 x weekly - 2 sets - 10 reps - 5 hold   GOALS: Goals reviewed with patient? Yes  SHORT TERM GOALS: Target date: 06/29/2023    Patient will be independent in home exercise program to improve strength/mobility for better functional independence with ADLs. Baseline: 01/26/2023: pt has some difficulty with HEP Need to reassess HEP Goal status: IN PROGRESS    LONG TERM GOALS: Target date: 08/17/2023    Patient will increase FOTO score to equal to or greater than 53    to demonstrate statistically significant improvement in mobility and quality of life.  Baseline: 3/5: 46%; 01/26/23: 45% ; 03/01/23: 42% 04/28/23: 52.5  8/22 54 Goal status: MET  2.  Patient (>  44 years old) will complete five times sit to stand test in < 15 seconds indicating an increased LE strength and improved balance. Baseline: 3/5: 38 seconds with SUE support; blocking of LLE; 01/26/23 18 seconds with pulling to stand; 03/01/23: 17  seconds pull to stand  04/28/23. 21.27(average of 2 trials) BUE on RW and CGA from PT  8/21 17.4sec with BUE support on RW and CGA from PT   Goal status: IN PROGRESS  3.  Patient will increase 10 meter walk test to >1.58m/s as to improve gait speed for better community ambulation and to reduce fall risk Baseline: 3/5: 1 min 43 seconds with RW and w/c follow; 01/26/2023: 2 min 6 seconds; 03/01/23: 1 min 11 sec, 0.14 m/s with WC follow and 2WW. 04/28/23: 56 sec (average of 2 trials) With 2WW.   8/22: 54.3 sec (average of 3 trials) With 2WW.  Goal status: IN PROGRESS  4.  Patient will increase BLE gross strength to 4/5 as to improve functional strength for independent gait, increased standing tolerance and increased ADL ability. Baseline: 3/5: grossly 2/5 LLE; 4//25/24: RLE grossly 4-/5, LLE grossly 2/5; 03/01/23 RLE grossly 4+/5 and LLE grossly 3+/5, exception 0/5 PF/DF (overall gross Bilat LE strength around 4-/5) 7/26: RLE 4+/5 except 5/5 knee extension. LLE: 4-/5 knee extension, hip adduction 4+/5, all others 3+/5 except ankle DF 2/5 and PF 2-/5  8/22: LLE: hip flexion: 3+,hip adduction 4+, abduction 4, knee extension 4-(delayed activation), knee flexion 4-/5 delayed activation. DF/PF: 1  Goal status: PARTIALLY MET  5.  Patient will increase 2 min walk test by >39ft to indicate improved access to community and safety with gait through house.  Baseline: 41ft with RW, AFO, and CGA from PT Goal status: Initial and New.   ASSESSMENT:  CLINICAL IMPRESSION:  Pt put forth excellent effort throughout session. PT treatment focused on improved activation of L side HS activation in functional transfers and gait with improved technique throughout session to reduce force of snap back and decreased incidence of GR. The pt will continue to benefit from skilled therapy to address remaining deficits in order to improve overall QoL and return to PLOF.     OBJECTIVE IMPAIRMENTS: Abnormal gait, cardiopulmonary  status limiting activity, decreased activity tolerance, decreased balance, decreased coordination, decreased endurance, decreased knowledge of use of DME, decreased mobility, difficulty walking, decreased ROM, decreased strength, impaired perceived functional ability, impaired flexibility, impaired UE functional use, improper body mechanics, postural dysfunction, obesity, and pain.   ACTIVITY LIMITATIONS: carrying, lifting, bending, sitting, standing, squatting, sleeping, stairs, transfers, bed mobility, bathing, toileting, dressing, reach over head, and caring for others  PARTICIPATION LIMITATIONS: meal prep, cleaning, laundry, interpersonal relationship, driving, shopping, community activity, and church  PERSONAL FACTORS: Age, Behavior pattern, Education, Fitness, Past/current experiences, Profession, Sex, Time since onset of injury/illness/exacerbation, and 3+ comorbidities: DM with diabetic neuropathy, stroke, HTN, AKI, UTI  are also affecting patient's functional outcome.   REHAB POTENTIAL: Fair length of time since CVA  CLINICAL DECISION MAKING: Evolving/moderate complexity  EVALUATION COMPLEXITY: Moderate  PLAN:  PT FREQUENCY: 2x/week  PT DURATION: 12 weeks  PLANNED INTERVENTIONS: Therapeutic exercises, Therapeutic activity, Neuromuscular re-education, Balance training, Gait training, Patient/Family education, Self Care, Joint mobilization, Stair training, Vestibular training, Canalith repositioning, Visual/preceptual remediation/compensation, Orthotic/Fit training, DME instructions, Cognitive remediation, Electrical stimulation, Wheelchair mobility training, Spinal mobilization, Cryotherapy, Moist heat, Splintting, Taping, Traction, Ultrasound, Manual therapy, and Re-evaluation  PLAN FOR NEXT SESSION:    Advance gait training intervals/ volume    Genelle Bal  Pricilla Holm PT  Physical Therapist- [place]  [facility]   8:00 AM 05/31/23

## 2023-06-02 ENCOUNTER — Ambulatory Visit: Payer: 59 | Admitting: Physical Therapy

## 2023-06-07 ENCOUNTER — Encounter: Payer: 59 | Admitting: Occupational Therapy

## 2023-06-07 ENCOUNTER — Ambulatory Visit: Payer: 59 | Attending: Internal Medicine

## 2023-06-07 DIAGNOSIS — R2681 Unsteadiness on feet: Secondary | ICD-10-CM | POA: Diagnosis present

## 2023-06-07 DIAGNOSIS — R262 Difficulty in walking, not elsewhere classified: Secondary | ICD-10-CM | POA: Insufficient documentation

## 2023-06-07 DIAGNOSIS — G8929 Other chronic pain: Secondary | ICD-10-CM | POA: Insufficient documentation

## 2023-06-07 DIAGNOSIS — R278 Other lack of coordination: Secondary | ICD-10-CM | POA: Insufficient documentation

## 2023-06-07 DIAGNOSIS — M6281 Muscle weakness (generalized): Secondary | ICD-10-CM | POA: Insufficient documentation

## 2023-06-07 DIAGNOSIS — M25561 Pain in right knee: Secondary | ICD-10-CM | POA: Diagnosis present

## 2023-06-07 NOTE — Therapy (Signed)
OUTPATIENT PHYSICAL THERAPY TREATMENT     Patient Name: Dana Lucas MRN: [id number] DOB:Dec 01, 1950, 72 y.o., female Today's Date: 06/07/2023   PCP: Bobbye Morton MD REFERRING PROVIDER: Louis Matte MD   END OF SESSION:  PT End of Session - 06/07/23 1325     Visit Number 39    Number of Visits 60    Date for PT Re-Evaluation 08/17/23    Authorization Type UHC Medicare; [place] Medicaid    Authorization Time Period 03/01/23-05/24/23    Progress Note Due on Visit 40    PT Start Time 1330    PT Stop Time 1400    PT Time Calculation (min) 30 min    Equipment Utilized During Treatment Gait belt    Activity Tolerance Patient tolerated treatment well;Patient limited by fatigue    Behavior During Therapy WFL for tasks assessed/performed                            Past Medical History:  Diagnosis Date   Diabetes mellitus without complication (HCC)    Hypertension    Stroke [facility])    Past Surgical History:  Procedure Laterality Date   ABDOMINAL HYSTERECTOMY     COLONOSCOPY N/A 10/06/2019   Procedure: COLONOSCOPY;  Surgeon: Toledo, Boykin Nearing, MD;  Location: [facility] ENDOSCOPY;  Service: Gastroenterology;  Laterality: N/A;   Patient Active Problem List   Diagnosis Date Noted   Hypertension    Diabetes mellitus without complication (HCC)    Stroke (HCC)    GIB (gastrointestinal bleeding)    AKI (acute kidney injury) (HCC)    UTI (urinary tract infection)     ONSET DATE: 2020  REFERRING DIAG: 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   THERAPY DIAG:  Muscle weakness (generalized)  Difficulty in walking, not elsewhere classified  Unsteadiness on feet  Other lack of coordination  Rationale for Evaluation and Treatment: Rehabilitation  SUBJECTIVE:                                                                                                                                                                                              SUBJECTIVE STATEMENT:   Pt reports no updates. She had a good weekend.   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 Stroke was four years ago, has had multiple strokes. Had a little bit of therapy, afterwards in [place]. Moved up to [place] about three years ago. [name] includes DM with diabetic neuropathy, stroke,  HTN, AKI, UTI. Uses a walker to walk to the bathroom (two wheel and three wheel walkers).  Has a power chair for out of the house.   PAIN:  Are you having pain? Yes Rt hand, Rt knee 5/10   PRECAUTIONS: Fall  WEIGHT BEARING RESTRICTIONS: No  FALLS: Has patient fallen in last 6 months? No  LIVING ENVIRONMENT: Lives with: lives with their family Lives in: House/apartment Stairs:  ramp outside Has following equipment at home: [name] - 2 wheeled, Environmental consultant - 4 wheeled, Wheelchair (power), shower chair, Ramped entry, and chair lift and hospital bed   PLOF: Independent  PATIENT GOALS: get up and walk by herself, wants to walk with a cane.   OBJECTIVE:     TODAY'S TREATMENT: DATE: 06/07/23  Gait belt donned and CGA provided throughout: interventions include mix of TE and NMR activities   Ambulation with RW and 2# AW around cones x 90 ft STS 5x hands-free Ambulation with RW and 2# AW x 40 ft STS 5x hands-free Rates medium Ambulation with RW 2# AW each LE ambulating around cones and step-tap onto balance pods and stepping over half-foam STS 3x  Amb with RW 2# AW each LE ambulating around cones and step tap onto balance pods and stepping over half foam    PATIENT EDUCATION: Education details: Pt educated throughout session about proper posture and technique with exercises. Improved exercise technique, movement at target joints, use of target muscles after min to mod verbal, visual, tactile cues.    HOME EXERCISE PROGRAM: continue HEP as previously  given Access Code: [email or web address] URL: [email or web address] Date: 12/06/2022 Prepared by: Precious Bard  Exercises - Leg Extension  - 1 x daily - 7 x weekly - 2 sets - 10 reps - 5 hold - Seated March  - 1 x daily - 7 x weekly - 2 sets - 10 reps - 5 hold - Seated Hip Abduction  - 1 x daily - 7 x weekly - 2 sets - 10 reps - 5 hold - Seated Hip Adduction Isometrics with Ball  - 1 x daily - 7 x weekly - 2 sets - 10 reps - 5 hold - Seated Weight Shifting Without Arm Support  - 1 x daily - 7 x weekly - 2 sets - 10 reps - 5 hold   GOALS: Goals reviewed with patient? Yes  SHORT TERM GOALS: Target date: 06/29/2023    Patient will be independent in home exercise program to improve strength/mobility for better functional independence with ADLs. Baseline: 01/26/2023: pt has some difficulty with HEP Need to reassess HEP Goal status: IN PROGRESS    LONG TERM GOALS: Target date: 08/17/2023    Patient will increase FOTO score to equal to or greater than 53    to demonstrate statistically significant improvement in mobility and quality of life.  Baseline: 3/5: 46%; 01/26/23: 45% ; 03/01/23: 42% 04/28/23: 52.5  8/22 54 Goal status: MET  2.  Patient (> 17 years old) will complete five times sit to stand test in < 15 seconds indicating an increased LE strength and improved balance. Baseline: 3/5: 38 seconds with SUE support; blocking of LLE; 01/26/23 18 seconds with pulling to stand; 03/01/23: 17 seconds pull to stand  04/28/23. 21.27(average of 2 trials) BUE on RW and CGA from PT  8/21 17.4sec with BUE support on RW and CGA from PT   Goal status: IN PROGRESS  3.  Patient will increase 10 meter walk test to >1.51m/s as to improve gait  speed for better community ambulation and to reduce fall risk Baseline: 3/5: 1 min 43 seconds with RW and w/c follow; 01/26/2023: 2 min 6 seconds; 03/01/23: 1 min 11 sec, 0.14 m/s with WC follow and 2WW. 04/28/23: 56 sec (average of 2 trials) With 2WW.   8/22: 54.3  sec (average of 3 trials) With 2WW.  Goal status: IN PROGRESS  4.  Patient will increase BLE gross strength to 4/5 as to improve functional strength for independent gait, increased standing tolerance and increased ADL ability. Baseline: 3/5: grossly 2/5 LLE; 4//25/24: RLE grossly 4-/5, LLE grossly 2/5; 03/01/23 RLE grossly 4+/5 and LLE grossly 3+/5, exception 0/5 PF/DF (overall gross Bilat LE strength around 4-/5) 7/26: RLE 4+/5 except 5/5 knee extension. LLE: 4-/5 knee extension, hip adduction 4+/5, all others 3+/5 except ankle DF 2/5 and PF 2-/5  8/22: LLE: hip flexion: 3+,hip adduction 4+, abduction 4, knee extension 4-(delayed activation), knee flexion 4-/5 delayed activation. DF/PF: 1  Goal status: PARTIALLY MET  5.  Patient will increase 2 min walk test by >83ft to indicate improved access to community and safety with gait through house.  Baseline: 84ft with RW, AFO, and CGA from PT Goal status: Initial and New.   ASSESSMENT:  CLINICAL IMPRESSION:  Pt presents at wrong time for appointment, limiting today's session. Interventions largely focused on ambulation and navigating around obstacles. Pt still limited by fatigue, although she was able to complete all gait activities with ankle weights donned. The pt will continue to benefit from skilled therapy to address remaining deficits in order to improve overall QoL and return to PLOF.     OBJECTIVE IMPAIRMENTS: Abnormal gait, cardiopulmonary status limiting activity, decreased activity tolerance, decreased balance, decreased coordination, decreased endurance, decreased knowledge of use of DME, decreased mobility, difficulty walking, decreased ROM, decreased strength, impaired perceived functional ability, impaired flexibility, impaired UE functional use, improper body mechanics, postural dysfunction, obesity, and pain.   ACTIVITY LIMITATIONS: carrying, lifting, bending, sitting, standing, squatting, sleeping, stairs, transfers, bed mobility,  bathing, toileting, dressing, reach over head, and caring for others  PARTICIPATION LIMITATIONS: meal prep, cleaning, laundry, interpersonal relationship, driving, shopping, community activity, and church  PERSONAL FACTORS: Age, Behavior pattern, Education, Fitness, Past/current experiences, Profession, Sex, Time since onset of injury/illness/exacerbation, and 3+ comorbidities: DM with diabetic neuropathy, stroke, HTN, AKI, UTI  are also affecting patient's functional outcome.   REHAB POTENTIAL: Fair length of time since CVA  CLINICAL DECISION MAKING: Evolving/moderate complexity  EVALUATION COMPLEXITY: Moderate  PLAN:  PT FREQUENCY: 2x/week  PT DURATION: 12 weeks  PLANNED INTERVENTIONS: Therapeutic exercises, Therapeutic activity, Neuromuscular re-education, Balance training, Gait training, Patient/Family education, Self Care, Joint mobilization, Stair training, Vestibular training, Canalith repositioning, Visual/preceptual remediation/compensation, Orthotic/Fit training, DME instructions, Cognitive remediation, Electrical stimulation, Wheelchair mobility training, Spinal mobilization, Cryotherapy, Moist heat, Splintting, Taping, Traction, Ultrasound, Manual therapy, and Re-evaluation  PLAN FOR NEXT SESSION:    Advance gait training intervals/ volume    Baird Kay PT  Physical Therapist- [facility]  [facility]   5:42 PM 06/07/23

## 2023-06-09 ENCOUNTER — Ambulatory Visit: Payer: 59 | Admitting: Physical Therapy

## 2023-06-09 DIAGNOSIS — R2681 Unsteadiness on feet: Secondary | ICD-10-CM

## 2023-06-09 DIAGNOSIS — R262 Difficulty in walking, not elsewhere classified: Secondary | ICD-10-CM

## 2023-06-09 DIAGNOSIS — M6281 Muscle weakness (generalized): Secondary | ICD-10-CM

## 2023-06-09 NOTE — Therapy (Signed)
OUTPATIENT PHYSICAL THERAPY TREATMENT / Physical Therapy Progress Note   Dates of reporting period  04/28/23   to   06/09/23     Patient Name: Dana Lucas MRN: [id number] DOB:[birth year], 72 y.o., female Today's Date: 06/09/2023   PCP: Bobbye Morton MD REFERRING PROVIDER: Louis Matte MD   END OF SESSION:  PT End of Session - 06/09/23 0949     Visit Number 40    Number of Visits 60    Date for PT Re-Evaluation 08/17/23    Authorization Type UHC Medicare; [place] Medicaid    Authorization Time Period 03/01/23-05/24/23    Progress Note Due on Visit 40    PT Start Time 1000    PT Stop Time 1042    PT Time Calculation (min) 42 min    Equipment Utilized During Treatment Gait belt    Activity Tolerance Patient tolerated treatment well;Patient limited by fatigue    Behavior During Therapy WFL for tasks assessed/performed                            Past Medical History:  Diagnosis Date   Diabetes mellitus without complication (HCC)    Hypertension    Stroke [facility])    Past Surgical History:  Procedure Laterality Date   ABDOMINAL HYSTERECTOMY     COLONOSCOPY N/A 10/06/2019   Procedure: COLONOSCOPY;  Surgeon: Toledo, Boykin Nearing, MD;  Location: [facility] ENDOSCOPY;  Service: Gastroenterology;  Laterality: N/A;   Patient Active Problem List   Diagnosis Date Noted   Hypertension    Diabetes mellitus without complication (HCC)    Stroke (HCC)    GIB (gastrointestinal bleeding)    AKI (acute kidney injury) (HCC)    UTI (urinary tract infection)     ONSET DATE: 2020  REFERRING DIAG: 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   THERAPY DIAG:  Muscle weakness (generalized)  Unsteadiness on feet  Difficulty in walking, not elsewhere classified  Rationale for Evaluation and Treatment: Rehabilitation  SUBJECTIVE:                                                                                                                                                                                              SUBJECTIVE STATEMENT:   Pt reports she feels like PT is helping. She still can't bath herself but it may be more of a result of her UE > LE weakness.    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 Stroke  was four years ago, has had multiple strokes. Had a little bit of therapy, afterwards in [place]. Moved up to [place] about three years ago. [name] includes DM with diabetic neuropathy, stroke, HTN, AKI, UTI. Uses a walker to walk to the bathroom (two wheel and three wheel walkers).  Has a power chair for out of the house.   PAIN:  Are you having pain? Yes Rt hand, Rt knee 5/10   PRECAUTIONS: Fall  WEIGHT BEARING RESTRICTIONS: No  FALLS: Has patient fallen in last 6 months? No  LIVING ENVIRONMENT: Lives with: lives with their family Lives in: House/apartment Stairs:  ramp outside Has following equipment at home: [name] - 2 wheeled, Environmental consultant - 4 wheeled, Wheelchair (power), shower chair, Ramped entry, and chair lift and hospital bed   PLOF: Independent  PATIENT GOALS: get up and walk by herself, wants to walk with a cane.   OBJECTIVE:     TODAY'S TREATMENT: DATE: 06/09/23  Physical therapy treatment session today consisted of completing assessment of goals and administration of testing as demonstrated and documented in flow sheet, treatment, and goals section of this note. Addition treatments may be found below.   Instructed in the below to add to HEP STS x 5 reps with cues for UE positioning   Sidestepping x 10 ea direction at support bar   PATIENT EDUCATION: Education details: Pt educated throughout session about proper posture and technique with exercises. Improved exercise technique, movement at target joints, use of target muscles after min to mod verbal, visual, tactile cues.    HOME EXERCISE  PROGRAM: continue HEP as previously given Access Code: [email or web address] URL: [email or web address] Date: 06/09/2023 Prepared by: Thresa Ross  Exercises - Leg Extension  - 1 x daily - 7 x weekly - 2 sets - 10 reps - 5 hold - Seated March  - 1 x daily - 7 x weekly - 2 sets - 10 reps - 5 hold - Seated Hip Abduction  - 1 x daily - 7 x weekly - 2 sets - 10 reps - 5 hold - Seated Hip Adduction Isometrics with Ball  - 1 x daily - 7 x weekly - 2 sets - 10 reps - 5 hold - Sit to Stand with Armchair  - 1 x daily - 7 x weekly - 2 sets - 5 reps - Side Stepping with Counter Support  - 1 x daily - 7 x weekly - 2 sets - 10 reps  GOALS: Goals reviewed with patient? Yes  SHORT TERM GOALS: Target date: 06/29/2023    Patient will be independent in home exercise program to improve strength/mobility for better functional independence with ADLs. Baseline: 01/26/2023: pt has some difficulty with HEP 06/09/23: completing 3 x per week ( on days not in therapy) Goal status: MET    LONG TERM GOALS: Target date: 08/17/2023    Patient will increase FOTO score to equal to or greater than 53    to demonstrate statistically significant improvement in mobility and quality of life.  Baseline: 3/5: 46%; 01/26/23: 45% ; 03/01/23: 42% 04/28/23: 52.5  8/22 54 9/6:53 Goal status: MET  2.  Patient (> 65 years old) will complete five times sit to stand test in < 15 seconds indicating an increased LE strength and improved balance. Baseline: 3/5: 38 seconds with SUE support; blocking of LLE; 01/26/23 18 seconds with pulling to stand; 03/01/23: 17 seconds pull to stand  04/28/23. 21.27(average of 2 trials) BUE on RW and CGA from PT  8/21  17.4sec with BUE support on RW and CGA from PT   06/08/20: 15.94 secB UE support, mostly completes with the R LE, does not get full erect posture each rep  Goal status: IN PROGRESS  3.  Patient will increase 10 meter walk test to >1.21m/s as to improve gait speed for better community  ambulation and to reduce fall risk Baseline: 3/5: 1 min 43 seconds with RW and w/c follow; 01/26/2023: 2 min 6 seconds; 03/01/23: 1 min 11 sec, 0.14 m/s with WC follow and 2WW. 04/28/23: 56 sec (average of 2 trials) With 2WW.   8/22: 54.3 sec (average of 3 trials) With 2WW.  9/6:70 sec, 69 sec, 70 sec ( average of 70 sec) Goal status: IN PROGRESS  4.  Patient will increase BLE gross strength to 4/5 as to improve functional strength for independent gait, increased standing tolerance and increased ADL ability. Baseline: 3/5: grossly 2/5 LLE; 4//25/24: RLE grossly 4-/5, LLE grossly 2/5; 03/01/23 RLE grossly 4+/5 and LLE grossly 3+/5, exception 0/5 PF/DF (overall gross Bilat LE strength around 4-/5) 7/26: RLE 4+/5 except 5/5 knee extension. LLE: 4-/5 knee extension, hip adduction 4+/5, all others 3+/5 except ankle DF 2/5 and PF 2-/5  8/22: LLE: hip flexion: 3+,hip adduction 4+, abduction 4, knee extension 4-(delayed activation), knee flexion 4-/5 delayed activation. DF/PF: 1  Goal status: PARTIALLY MET  5.  Patient will increase 2 min walk test by >12ft to indicate improved access to community and safety with gait through house.  Baseline: 32ft with RW, AFO, and CGA from PT 9/6:65 ft with RW  Goal status: ONGOING  ASSESSMENT:  CLINICAL IMPRESSION:  Pt presents to PT for progress note this date. Pt Shows improved functional strength with 5xSTS but shows slowed gait speed and limited endurance improvement with gait related functional tests. Added a few functional strengthening activities to pt HEP and pt verbalized and demonstrated good understanding and new handout was provided including the established HEP. Patient's condition has the potential to improve in response to therapy. Maximum improvement is yet to be obtained. The anticipated improvement is attainable and reasonable in a generally predictable time.  Pt will continue to benefit from skilled physical therapy intervention to address impairments,  improve QOL, and attain therapy goals.     OBJECTIVE IMPAIRMENTS: Abnormal gait, cardiopulmonary status limiting activity, decreased activity tolerance, decreased balance, decreased coordination, decreased endurance, decreased knowledge of use of DME, decreased mobility, difficulty walking, decreased ROM, decreased strength, impaired perceived functional ability, impaired flexibility, impaired UE functional use, improper body mechanics, postural dysfunction, obesity, and pain.   ACTIVITY LIMITATIONS: carrying, lifting, bending, sitting, standing, squatting, sleeping, stairs, transfers, bed mobility, bathing, toileting, dressing, reach over head, and caring for others  PARTICIPATION LIMITATIONS: meal prep, cleaning, laundry, interpersonal relationship, driving, shopping, community activity, and church  PERSONAL FACTORS: Age, Behavior pattern, Education, Fitness, Past/current experiences, Profession, Sex, Time since onset of injury/illness/exacerbation, and 3+ comorbidities: DM with diabetic neuropathy, stroke, HTN, AKI, UTI  are also affecting patient's functional outcome.   REHAB POTENTIAL: Fair length of time since CVA  CLINICAL DECISION MAKING: Evolving/moderate complexity  EVALUATION COMPLEXITY: Moderate  PLAN:  PT FREQUENCY: 2x/week  PT DURATION: 12 weeks  PLANNED INTERVENTIONS: Therapeutic exercises, Therapeutic activity, Neuromuscular re-education, Balance training, Gait training, Patient/Family education, Self Care, Joint mobilization, Stair training, Vestibular training, Canalith repositioning, Visual/preceptual remediation/compensation, Orthotic/Fit training, DME instructions, Cognitive remediation, Electrical stimulation, Wheelchair mobility training, Spinal mobilization, Cryotherapy, Moist heat, Splintting, Taping, Traction, Ultrasound, Manual therapy, and Re-evaluation  PLAN FOR  NEXT SESSION:    Advance gait training intervals/ volume    Norman Herrlich PT  Physical  Therapist- [facility]  [facility]   9:49 AM 06/09/23

## 2023-06-14 ENCOUNTER — Ambulatory Visit: Payer: 59 | Admitting: Physical Therapy

## 2023-06-14 ENCOUNTER — Encounter: Payer: 59 | Admitting: Occupational Therapy

## 2023-06-15 ENCOUNTER — Ambulatory Visit: Payer: 59 | Admitting: Physical Therapy

## 2023-06-15 DIAGNOSIS — M6281 Muscle weakness (generalized): Secondary | ICD-10-CM | POA: Diagnosis not present

## 2023-06-15 DIAGNOSIS — R278 Other lack of coordination: Secondary | ICD-10-CM

## 2023-06-15 DIAGNOSIS — G8929 Other chronic pain: Secondary | ICD-10-CM

## 2023-06-15 DIAGNOSIS — R2681 Unsteadiness on feet: Secondary | ICD-10-CM

## 2023-06-15 DIAGNOSIS — R262 Difficulty in walking, not elsewhere classified: Secondary | ICD-10-CM

## 2023-06-15 NOTE — Therapy (Signed)
OUTPATIENT PHYSICAL THERAPY TREATMENT     Patient Name: Dana Lucas MRN: [id number] DOB:Oct 06, 1950, 72 y.o., female Today's Date: 06/15/2023   PCP: Bobbye Morton MD REFERRING PROVIDER: Louis Matte MD   END OF SESSION:  PT End of Session - 06/15/23 0840     Visit Number 41    Number of Visits 60    Date for PT Re-Evaluation 08/17/23    Authorization Type UHC Medicare; [place] Medicaid    Authorization Time Period 03/01/23-05/24/23    Progress Note Due on Visit 40    PT Start Time 0845    PT Stop Time 0930    PT Time Calculation (min) 45 min    Equipment Utilized During Treatment Gait belt    Activity Tolerance Patient tolerated treatment well;Patient limited by fatigue    Behavior During Therapy WFL for tasks assessed/performed                            Past Medical History:  Diagnosis Date   Diabetes mellitus without complication (HCC)    Hypertension    Stroke [facility])    Past Surgical History:  Procedure Laterality Date   ABDOMINAL HYSTERECTOMY     COLONOSCOPY N/A 10/06/2019   Procedure: COLONOSCOPY;  Surgeon: Toledo, Boykin Nearing, MD;  Location: [facility] ENDOSCOPY;  Service: Gastroenterology;  Laterality: N/A;   Patient Active Problem List   Diagnosis Date Noted   Hypertension    Diabetes mellitus without complication (HCC)    Stroke (HCC)    GIB (gastrointestinal bleeding)    AKI (acute kidney injury) (HCC)    UTI (urinary tract infection)     ONSET DATE: 2020  REFERRING DIAG: 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   THERAPY DIAG:  Muscle weakness (generalized)  Chronic pain of right knee  Difficulty in walking, not elsewhere classified  Unsteadiness on feet  Other lack of coordination  Rationale for Evaluation and Treatment: Rehabilitation  SUBJECTIVE:                                                                                                                                                                                              SUBJECTIVE STATEMENT:   Pt reports she feels  is doing well. No updates since last PT session. Pt arrived to PT treatment with AFO, reports that double upright shoe is bothering her and custom AFO is still missing connection piece.    Pt accompanied by: self  PERTINENT HISTORY:   Patient presents for Hemiplegia and hemiparesis following CVA affecting L non dominant  side. Stroke was four years ago, has had multiple strokes. Had a little bit of therapy, afterwards in [place]. Moved up to [place] about three years ago. [name] includes DM with diabetic neuropathy, stroke, HTN, AKI, UTI. Uses a walker to walk to the bathroom (two wheel and three wheel walkers).  Has a power chair for out of the house.   PAIN:  Are you having pain? Yes Rt hand, Rt knee 5/10   PRECAUTIONS: Fall  WEIGHT BEARING RESTRICTIONS: No  FALLS: Has patient fallen in last 6 months? No  LIVING ENVIRONMENT: Lives with: lives with their family Lives in: House/apartment Stairs:  ramp outside Has following equipment at home: [name] - 2 wheeled, Environmental consultant - 4 wheeled, Wheelchair (power), shower chair, Ramped entry, and chair lift and hospital bed   PLOF: Independent  PATIENT GOALS: get up and walk by herself, wants to walk with a cane.   OBJECTIVE:     TODAY'S TREATMENT: DATE: 06/15/23  PT assisted pt to don L AFO with lateral upright. Max assist for foot position in shoe. Pt able to assist with pushing heel into shoe.   Nustep reciprocal movement and endurance training x 4 min level 1-3 with cues for full ROM in RUE into full extension as able. Pt reports need for restroom following nustep endurance training. Pt went with granddaughter to restroom for ~ 5 min of session.   Gait to and from Nustep x 6 ft with RW and AFO and CG from PT. Additional dynamic gait training to perform figure 8 around cones in floor at 82ft 2 x 2 laps.   Foot tap on 4inch step 2 x 10 bil with  tactile cues for improved knee control to prevent GR.  Forward/reverse gait with RW 38ft x 2 with CGA for safety and cues for step height and knee control on the LLE.   Max assist to don AFO at end of PT treatment.     CGA provided for all gait and standing tasks for safety and reduced fall risk unless otherwise stated.   PATIENT EDUCATION: Education details: Pt educated throughout session about proper posture and technique with exercises. Improved exercise technique, movement at target joints, use of target muscles after min to mod verbal, visual, tactile cues.    HOME EXERCISE PROGRAM: continue HEP as previously given Access Code: [email or web address] URL: [email or web address] Date: 06/09/2023 Prepared by: Thresa Ross  Exercises - Leg Extension  - 1 x daily - 7 x weekly - 2 sets - 10 reps - 5 hold - Seated March  - 1 x daily - 7 x weekly - 2 sets - 10 reps - 5 hold - Seated Hip Abduction  - 1 x daily - 7 x weekly - 2 sets - 10 reps - 5 hold - Seated Hip Adduction Isometrics with Ball  - 1 x daily - 7 x weekly - 2 sets - 10 reps - 5 hold - Sit to Stand with Armchair  - 1 x daily - 7 x weekly - 2 sets - 5 reps - Side Stepping with Counter Support  - 1 x daily - 7 x weekly - 2 sets - 10 reps  GOALS: Goals reviewed with patient? Yes  SHORT TERM GOALS: Target date: 06/29/2023    Patient will be independent in home exercise program to improve strength/mobility for better functional independence with ADLs. Baseline: 01/26/2023: pt has some difficulty with HEP 06/09/23: completing 3 x per week ( on days not in therapy) Goal status:  MET    LONG TERM GOALS: Target date: 08/17/2023    Patient will increase FOTO score to equal to or greater than 53    to demonstrate statistically significant improvement in mobility and quality of life.  Baseline: 3/5: 46%; 01/26/23: 45% ; 03/01/23: 42% 04/28/23: 52.5  8/22 54 9/6:53 Goal status: MET  2.  Patient (> 46 years old) will complete  five times sit to stand test in < 15 seconds indicating an increased LE strength and improved balance. Baseline: 3/5: 38 seconds with SUE support; blocking of LLE; 01/26/23 18 seconds with pulling to stand; 03/01/23: 17 seconds pull to stand  04/28/23. 21.27(average of 2 trials) BUE on RW and CGA from PT  8/21 17.4sec with BUE support on RW and CGA from PT   06/08/20: 15.94 secB UE support, mostly completes with the R LE, does not get full erect posture each rep  Goal status: IN PROGRESS  3.  Patient will increase 10 meter walk test to >1.82m/s as to improve gait speed for better community ambulation and to reduce fall risk Baseline: 3/5: 1 min 43 seconds with RW and w/c follow; 01/26/2023: 2 min 6 seconds; 03/01/23: 1 min 11 sec, 0.14 m/s with WC follow and 2WW. 04/28/23: 56 sec (average of 2 trials) With 2WW.   8/22: 54.3 sec (average of 3 trials) With 2WW.  9/6:70 sec, 69 sec, 70 sec ( average of 70 sec) Goal status: IN PROGRESS  4.  Patient will increase BLE gross strength to 4/5 as to improve functional strength for independent gait, increased standing tolerance and increased ADL ability. Baseline: 3/5: grossly 2/5 LLE; 4//25/24: RLE grossly 4-/5, LLE grossly 2/5; 03/01/23 RLE grossly 4+/5 and LLE grossly 3+/5, exception 0/5 PF/DF (overall gross Bilat LE strength around 4-/5) 7/26: RLE 4+/5 except 5/5 knee extension. LLE: 4-/5 knee extension, hip adduction 4+/5, all others 3+/5 except ankle DF 2/5 and PF 2-/5  8/22: LLE: hip flexion: 3+,hip adduction 4+, abduction 4, knee extension 4-(delayed activation), knee flexion 4-/5 delayed activation. DF/PF: 1  Goal status: PARTIALLY MET  5.  Patient will increase 2 min walk test by >31ft to indicate improved access to community and safety with gait through house.  Baseline: 48ft with RW, AFO, and CGA from PT 9/6:65 ft with RW  Goal status: ONGOING  ASSESSMENT:  CLINICAL IMPRESSION:  Pt presents to PT motivated to participate. Reports that AFOs are  both hurting or broken, using pre-fab AFO today for improved knee and ankle control. Treatment focused on Bil endurance and strength on nustep as well as functional mobility training with dynamic gait with obstacle navigation and retroversion. Improved gait pattern with instruction from PT to prevent GR Pt will continue to benefit from skilled physical therapy intervention to address impairments, improve QOL, and attain therapy goals.     OBJECTIVE IMPAIRMENTS: Abnormal gait, cardiopulmonary status limiting activity, decreased activity tolerance, decreased balance, decreased coordination, decreased endurance, decreased knowledge of use of DME, decreased mobility, difficulty walking, decreased ROM, decreased strength, impaired perceived functional ability, impaired flexibility, impaired UE functional use, improper body mechanics, postural dysfunction, obesity, and pain.   ACTIVITY LIMITATIONS: carrying, lifting, bending, sitting, standing, squatting, sleeping, stairs, transfers, bed mobility, bathing, toileting, dressing, reach over head, and caring for others  PARTICIPATION LIMITATIONS: meal prep, cleaning, laundry, interpersonal relationship, driving, shopping, community activity, and church  PERSONAL FACTORS: Age, Behavior pattern, Education, Fitness, Past/current experiences, Profession, Sex, Time since onset of injury/illness/exacerbation, and 3+ comorbidities: DM with diabetic neuropathy, stroke,  HTN, AKI, UTI  are also affecting patient's functional outcome.   REHAB POTENTIAL: Fair length of time since CVA  CLINICAL DECISION MAKING: Evolving/moderate complexity  EVALUATION COMPLEXITY: Moderate  PLAN:  PT FREQUENCY: 2x/week  PT DURATION: 12 weeks  PLANNED INTERVENTIONS: Therapeutic exercises, Therapeutic activity, Neuromuscular re-education, Balance training, Gait training, Patient/Family education, Self Care, Joint mobilization, Stair training, Vestibular training, Canalith  repositioning, Visual/preceptual remediation/compensation, Orthotic/Fit training, DME instructions, Cognitive remediation, Electrical stimulation, Wheelchair mobility training, Spinal mobilization, Cryotherapy, Moist heat, Splintting, Taping, Traction, Ultrasound, Manual therapy, and Re-evaluation  PLAN FOR NEXT SESSION:    Advance gait training intervals/ volume    Golden Pop PT  Physical Therapist- [place]  [facility]   9:36 AM 06/15/23

## 2023-06-16 ENCOUNTER — Ambulatory Visit: Payer: 59 | Admitting: Physical Therapy

## 2023-06-16 DIAGNOSIS — R262 Difficulty in walking, not elsewhere classified: Secondary | ICD-10-CM

## 2023-06-16 DIAGNOSIS — M6281 Muscle weakness (generalized): Secondary | ICD-10-CM

## 2023-06-16 DIAGNOSIS — R2681 Unsteadiness on feet: Secondary | ICD-10-CM

## 2023-06-16 NOTE — Therapy (Signed)
OUTPATIENT PHYSICAL THERAPY TREATMENT     Patient Name: Dana Lucas MRN: [id number] DOB:28-May-1951, 72 y.o., female Today's Date: 06/16/2023   PCP: Bobbye Morton MD REFERRING PROVIDER: Louis Matte MD   END OF SESSION:  PT End of Session - 06/16/23 0955     Visit Number 42    Number of Visits 60    Date for PT Re-Evaluation 08/17/23    Authorization Type UHC Medicare; [place] Medicaid    Authorization Time Period 03/01/23-05/24/23    Progress Note Due on Visit 50    Equipment Utilized During Treatment Gait belt    Activity Tolerance Patient tolerated treatment well;Patient limited by fatigue    Behavior During Therapy [facility] for tasks assessed/performed                            Past Medical History:  Diagnosis Date   Diabetes mellitus without complication (HCC)    Hypertension    Stroke [facility])    Past Surgical History:  Procedure Laterality Date   ABDOMINAL HYSTERECTOMY     COLONOSCOPY N/A 10/06/2019   Procedure: COLONOSCOPY;  Surgeon: Toledo, Boykin Nearing, MD;  Location: [facility] ENDOSCOPY;  Service: Gastroenterology;  Laterality: N/A;   Patient Active Problem List   Diagnosis Date Noted   Hypertension    Diabetes mellitus without complication (HCC)    Stroke (HCC)    GIB (gastrointestinal bleeding)    AKI (acute kidney injury) (HCC)    UTI (urinary tract infection)     ONSET DATE: 2020  REFERRING DIAG: 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   THERAPY DIAG:  No diagnosis found.  Rationale for Evaluation and Treatment: Rehabilitation  SUBJECTIVE:                                                                                                                                                                                             SUBJECTIVE STATEMENT:   Pt reports she feels  is doing well. No updates since last PT session.    Pt accompanied by: self  PERTINENT HISTORY:   Patient presents for  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 Stroke was four years ago, has had multiple strokes. Had a little bit of therapy, afterwards in [place]. Moved up to [place] about three years ago. [name] includes DM with diabetic neuropathy, stroke, HTN, AKI, UTI. Uses a walker to walk to the bathroom (two wheel and three wheel walkers).  Has a power chair for out of the house.   PAIN:  Are you  having pain? Yes Rt hand, Rt knee 5/10   PRECAUTIONS: Fall  WEIGHT BEARING RESTRICTIONS: No  FALLS: Has patient fallen in last 6 months? No  LIVING ENVIRONMENT: Lives with: lives with their family Lives in: House/apartment Stairs:  ramp outside Has following equipment at home: [name] - 2 wheeled, Environmental consultant - 4 wheeled, Wheelchair (power), shower chair, Ramped entry, and chair lift and hospital bed   PLOF: Independent  PATIENT GOALS: get up and walk by herself, wants to walk with a cane.   OBJECTIVE:     TODAY'S TREATMENT: DATE: 06/16/23  PT assisted pt to don L AFO with lateral upright. Max assist for foot position in shoe. Pt able to assist with pushing heel into shoe.   Nustep reciprocal movement and endurance training x 7 min level 2 with cues for full ROM in RUE into full extension as able.   Gait in gym with turns with cues to prevent GR. X 80 ft   Attempted LLE knee extension reps in standing with resistance GTB, unable to properly coordinate movements, consistently used B LE for movements and was unable to obtain full knee extension on the L  Seated LAQ with A from PT for end range control. X 10 reps   Ambulation in gym x 85 ft with RW and cues and assist to prevent GR  REST, pt audibly showing increased RR until end of therapeutic rest   Ambulation in gym x 55 ft with RW and cues and assist to prevent GR  STS x 10 with R UE assist, cues for full eect standing each rep.   Max assist to doff AFO at end of PT treatment.     CGA provided for all  gait and standing tasks for safety and reduced fall risk unless otherwise stated.   PATIENT EDUCATION: Education details: Pt educated throughout session about proper posture and technique with exercises. Improved exercise technique, movement at target joints, use of target muscles after min to mod verbal, visual, tactile cues.    HOME EXERCISE PROGRAM: continue HEP as previously given Access Code: [email or web address] URL: [email or web address] Date: 06/09/2023 Prepared by: Thresa Ross  Exercises - Leg Extension  - 1 x daily - 7 x weekly - 2 sets - 10 reps - 5 hold - Seated March  - 1 x daily - 7 x weekly - 2 sets - 10 reps - 5 hold - Seated Hip Abduction  - 1 x daily - 7 x weekly - 2 sets - 10 reps - 5 hold - Seated Hip Adduction Isometrics with Ball  - 1 x daily - 7 x weekly - 2 sets - 10 reps - 5 hold - Sit to Stand with Armchair  - 1 x daily - 7 x weekly - 2 sets - 5 reps - Side Stepping with Counter Support  - 1 x daily - 7 x weekly - 2 sets - 10 reps  GOALS: Goals reviewed with patient? Yes  SHORT TERM GOALS: Target date: 06/29/2023    Patient will be independent in home exercise program to improve strength/mobility for better functional independence with ADLs. Baseline: 01/26/2023: pt has some difficulty with HEP 06/09/23: completing 3 x per week ( on days not in therapy) Goal status: MET    LONG TERM GOALS: Target date: 08/17/2023    Patient will increase FOTO score to equal to or greater than 53    to demonstrate statistically significant improvement in mobility and quality of life.  Baseline: 3/5: 46%; 01/26/23: 45% ;  03/01/23: 42% 04/28/23: 52.5  8/22 54 9/6:53 Goal status: MET  2.  Patient (> 73 years old) will complete five times sit to stand test in < 15 seconds indicating an increased LE strength and improved balance. Baseline: 3/5: 38 seconds with SUE support; blocking of LLE; 01/26/23 18 seconds with pulling to stand; 03/01/23: 17 seconds pull to stand   04/28/23. 21.27(average of 2 trials) BUE on RW and CGA from PT  8/21 17.4sec with BUE support on RW and CGA from PT   06/08/20: 15.94 secB UE support, mostly completes with the R LE, does not get full erect posture each rep  Goal status: IN PROGRESS  3.  Patient will increase 10 meter walk test to >1.1m/s as to improve gait speed for better community ambulation and to reduce fall risk Baseline: 3/5: 1 min 43 seconds with RW and w/c follow; 01/26/2023: 2 min 6 seconds; 03/01/23: 1 min 11 sec, 0.14 m/s with WC follow and 2WW. 04/28/23: 56 sec (average of 2 trials) With 2WW.   8/22: 54.3 sec (average of 3 trials) With 2WW.  9/6:70 sec, 69 sec, 70 sec ( average of 70 sec) Goal status: IN PROGRESS  4.  Patient will increase BLE gross strength to 4/5 as to improve functional strength for independent gait, increased standing tolerance and increased ADL ability. Baseline: 3/5: grossly 2/5 LLE; 4//25/24: RLE grossly 4-/5, LLE grossly 2/5; 03/01/23 RLE grossly 4+/5 and LLE grossly 3+/5, exception 0/5 PF/DF (overall gross Bilat LE strength around 4-/5) 7/26: RLE 4+/5 except 5/5 knee extension. LLE: 4-/5 knee extension, hip adduction 4+/5, all others 3+/5 except ankle DF 2/5 and PF 2-/5  8/22: LLE: hip flexion: 3+,hip adduction 4+, abduction 4, knee extension 4-(delayed activation), knee flexion 4-/5 delayed activation. DF/PF: 1  Goal status: PARTIALLY MET  5.  Patient will increase 2 min walk test by >58ft to indicate improved access to community and safety with gait through house.  Baseline: 50ft with RW, AFO, and CGA from PT 9/6:65 ft with RW  Goal status: ONGOING  ASSESSMENT:  CLINICAL IMPRESSION:  Pt presents to PT motivated to participate. Treatment focused on Bil endurance and strength on nustep as well as functional mobility training with dynamic gait with obstacle navigation and retroversion. Improved gait pattern with instruction from PT to prevent GR, but unable to complete consistently,  particularly with turns.  Pt will continue to benefit from skilled physical therapy intervention to address impairments, improve QOL, and attain therapy goals.     OBJECTIVE IMPAIRMENTS: Abnormal gait, cardiopulmonary status limiting activity, decreased activity tolerance, decreased balance, decreased coordination, decreased endurance, decreased knowledge of use of DME, decreased mobility, difficulty walking, decreased ROM, decreased strength, impaired perceived functional ability, impaired flexibility, impaired UE functional use, improper body mechanics, postural dysfunction, obesity, and pain.   ACTIVITY LIMITATIONS: carrying, lifting, bending, sitting, standing, squatting, sleeping, stairs, transfers, bed mobility, bathing, toileting, dressing, reach over head, and caring for others  PARTICIPATION LIMITATIONS: meal prep, cleaning, laundry, interpersonal relationship, driving, shopping, community activity, and church  PERSONAL FACTORS: Age, Behavior pattern, Education, Fitness, Past/current experiences, Profession, Sex, Time since onset of injury/illness/exacerbation, and 3+ comorbidities: DM with diabetic neuropathy, stroke, HTN, AKI, UTI  are also affecting patient's functional outcome.   REHAB POTENTIAL: Fair length of time since CVA  CLINICAL DECISION MAKING: Evolving/moderate complexity  EVALUATION COMPLEXITY: Moderate  PLAN:  PT FREQUENCY: 2x/week  PT DURATION: 12 weeks  PLANNED INTERVENTIONS: Therapeutic exercises, Therapeutic activity, Neuromuscular re-education, Balance training, Gait training, Patient/Family  education, Self Care, Joint mobilization, Stair training, Vestibular training, Canalith repositioning, Visual/preceptual remediation/compensation, Orthotic/Fit training, DME instructions, Cognitive remediation, Electrical stimulation, Wheelchair mobility training, Spinal mobilization, Cryotherapy, Moist heat, Splintting, Taping, Traction, Ultrasound, Manual therapy, and  Re-evaluation  PLAN FOR NEXT SESSION:    Advance gait training intervals/ volume    Norman Herrlich PT  Physical Therapist- [facility]  [facility]   9:55 AM 06/16/23

## 2023-06-21 ENCOUNTER — Encounter: Payer: 59 | Admitting: Occupational Therapy

## 2023-06-21 ENCOUNTER — Ambulatory Visit: Payer: 59 | Admitting: Physical Therapy

## 2023-06-21 DIAGNOSIS — R262 Difficulty in walking, not elsewhere classified: Secondary | ICD-10-CM

## 2023-06-21 DIAGNOSIS — M6281 Muscle weakness (generalized): Secondary | ICD-10-CM

## 2023-06-21 DIAGNOSIS — R2681 Unsteadiness on feet: Secondary | ICD-10-CM

## 2023-06-21 NOTE — Therapy (Signed)
OUTPATIENT PHYSICAL THERAPY TREATMENT     Patient Name: Dana Lucas MRN: [id number] DOB:[birth year], 72 y.o., female Today's Date: 06/21/2023   PCP: Bobbye Morton MD REFERRING PROVIDER: Louis Matte MD   END OF SESSION:  PT End of Session - 06/21/23 1001     Visit Number 43    Number of Visits 60    Date for PT Re-Evaluation 08/17/23    Authorization Type UHC Medicare; [place] Medicaid    Authorization Time Period 03/01/23-05/24/23    Progress Note Due on Visit 50    PT Start Time 0931    PT Stop Time 1013    PT Time Calculation (min) 42 min    Equipment Utilized During Treatment Gait belt    Activity Tolerance Patient tolerated treatment well;Patient limited by fatigue    Behavior During Therapy WFL for tasks assessed/performed                            Past Medical History:  Diagnosis Date   Diabetes mellitus without complication (HCC)    Hypertension    Stroke [facility])    Past Surgical History:  Procedure Laterality Date   ABDOMINAL HYSTERECTOMY     COLONOSCOPY N/A 10/06/2019   Procedure: COLONOSCOPY;  Surgeon: Toledo, Boykin Nearing, MD;  Location: [facility] ENDOSCOPY;  Service: Gastroenterology;  Laterality: N/A;   Patient Active Problem List   Diagnosis Date Noted   Hypertension    Diabetes mellitus without complication (HCC)    Stroke (HCC)    GIB (gastrointestinal bleeding)    AKI (acute kidney injury) (HCC)    UTI (urinary tract infection)     ONSET DATE: 2020  REFERRING DIAG: 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   THERAPY DIAG:  Muscle weakness (generalized)  Difficulty in walking, not elsewhere classified  Unsteadiness on feet  Rationale for Evaluation and Treatment: Rehabilitation  SUBJECTIVE:                                                                                                                                                                                             SUBJECTIVE  STATEMENT:   Pt reports she feels  is doing well. No updates since last PT session.    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 Stroke was four years ago, has had multiple strokes. Had a little bit of therapy, afterwards in [place]. Moved up to [place] about three years ago. [name] includes DM with diabetic neuropathy,  stroke, HTN, AKI, UTI. Uses a walker to walk to the bathroom (two wheel and three wheel walkers).  Has a power chair for out of the house.   PAIN:  Are you having pain? Yes Rt hand, Rt knee 5/10   PRECAUTIONS: Fall  WEIGHT BEARING RESTRICTIONS: No  FALLS: Has patient fallen in last 6 months? No  LIVING ENVIRONMENT: Lives with: lives with their family Lives in: House/apartment Stairs:  ramp outside Has following equipment at home: [name] - 2 wheeled, Environmental consultant - 4 wheeled, Wheelchair (power), shower chair, Ramped entry, and chair lift and hospital bed   PLOF: Independent  PATIENT GOALS: get up and walk by herself, wants to walk with a cane.   OBJECTIVE:     TODAY'S TREATMENT: DATE: 06/21/23  Unless otherwise stated, CGA was provided and gait belt donned in order to ensure pt safety  Nustep reciprocal movement and endurance training x 7 min level 2 with cues for full ROM in RUE into full extension as able.   PT assisted pt to don L AFO with lateral upright. Max assist for foot position in shoe. Pt able to assist with pushing heel into sh Gait in gym with turns with cues to prevent GR. 2 X 80 ft   // bars stepping over 3 x 1/2 foam rollers with LLE leading and cues to attempt to keep L UE from GR   Step over 1/2 FR to toe contact on another 1/2 FR f r practice with weight shift without GR, mixed success, cues for proper form and completion.   Pt does have a required bathroom break during session that took some time from the treatment session, pt independent  with bathroom activities with increased time.     PATIENT EDUCATION: Education details: Pt educated throughout session about proper posture and technique with exercises. Improved exercise technique, movement at target joints, use of target muscles after min to mod verbal, visual, tactile cues.    HOME EXERCISE PROGRAM: continue HEP as previously given Access Code: [email or web address] URL: [email or web address] Date: 06/09/2023 Prepared by: Thresa Ross  Exercises - Leg Extension  - 1 x daily - 7 x weekly - 2 sets - 10 reps - 5 hold - Seated March  - 1 x daily - 7 x weekly - 2 sets - 10 reps - 5 hold - Seated Hip Abduction  - 1 x daily - 7 x weekly - 2 sets - 10 reps - 5 hold - Seated Hip Adduction Isometrics with Ball  - 1 x daily - 7 x weekly - 2 sets - 10 reps - 5 hold - Sit to Stand with Armchair  - 1 x daily - 7 x weekly - 2 sets - 5 reps - Side Stepping with Counter Support  - 1 x daily - 7 x weekly - 2 sets - 10 reps  GOALS: Goals reviewed with patient? Yes  SHORT TERM GOALS: Target date: 06/29/2023    Patient will be independent in home exercise program to improve strength/mobility for better functional independence with ADLs. Baseline: 01/26/2023: pt has some difficulty with HEP 06/09/23: completing 3 x per week ( on days not in therapy) Goal status: MET    LONG TERM GOALS: Target date: 08/17/2023    Patient will increase FOTO score to equal to or greater than 53    to demonstrate statistically significant improvement in mobility and quality of life.  Baseline: 3/5: 46%; 01/26/23: 45% ; 03/01/23: 42% 04/28/23: 52.5  8/22 54 9/6:53 Goal  status: MET  2.  Patient (> 61 years old) will complete five times sit to stand test in < 15 seconds indicating an increased LE strength and improved balance. Baseline: 3/5: 38 seconds with SUE support; blocking of LLE; 01/26/23 18 seconds with pulling to stand; 03/01/23: 17 seconds pull to stand  04/28/23. 21.27(average of 2  trials) BUE on RW and CGA from PT  8/21 17.4sec with BUE support on RW and CGA from PT   06/08/20: 15.94 secB UE support, mostly completes with the R LE, does not get full erect posture each rep  Goal status: IN PROGRESS  3.  Patient will increase 10 meter walk test to >1.69m/s as to improve gait speed for better community ambulation and to reduce fall risk Baseline: 3/5: 1 min 43 seconds with RW and w/c follow; 01/26/2023: 2 min 6 seconds; 03/01/23: 1 min 11 sec, 0.14 m/s with WC follow and 2WW. 04/28/23: 56 sec (average of 2 trials) With 2WW.   8/22: 54.3 sec (average of 3 trials) With 2WW.  9/6:70 sec, 69 sec, 70 sec ( average of 70 sec) Goal status: IN PROGRESS  4.  Patient will increase BLE gross strength to 4/5 as to improve functional strength for independent gait, increased standing tolerance and increased ADL ability. Baseline: 3/5: grossly 2/5 LLE; 4//25/24: RLE grossly 4-/5, LLE grossly 2/5; 03/01/23 RLE grossly 4+/5 and LLE grossly 3+/5, exception 0/5 PF/DF (overall gross Bilat LE strength around 4-/5) 7/26: RLE 4+/5 except 5/5 knee extension. LLE: 4-/5 knee extension, hip adduction 4+/5, all others 3+/5 except ankle DF 2/5 and PF 2-/5  8/22: LLE: hip flexion: 3+,hip adduction 4+, abduction 4, knee extension 4-(delayed activation), knee flexion 4-/5 delayed activation. DF/PF: 1  Goal status: PARTIALLY MET  5.  Patient will increase 2 min walk test by >42ft to indicate improved access to community and safety with gait through house.  Baseline: 18ft with RW, AFO, and CGA from PT 9/6:65 ft with RW  Goal status: ONGOING  ASSESSMENT:  CLINICAL IMPRESSION:  Pt presents to PT motivated to participate. Treatment focused on Bil endurance and strength on nustep as well as functional mobility training with dynamic gait with obstacle navigation. Improved gait pattern with instruction from PT to prevent GR, but unable to complete consistently, particularly with obstacles.  Pt will continue to  benefit from skilled physical therapy intervention to address impairments, improve QOL, and attain therapy goals.     OBJECTIVE IMPAIRMENTS: Abnormal gait, cardiopulmonary status limiting activity, decreased activity tolerance, decreased balance, decreased coordination, decreased endurance, decreased knowledge of use of DME, decreased mobility, difficulty walking, decreased ROM, decreased strength, impaired perceived functional ability, impaired flexibility, impaired UE functional use, improper body mechanics, postural dysfunction, obesity, and pain.   ACTIVITY LIMITATIONS: carrying, lifting, bending, sitting, standing, squatting, sleeping, stairs, transfers, bed mobility, bathing, toileting, dressing, reach over head, and caring for others  PARTICIPATION LIMITATIONS: meal prep, cleaning, laundry, interpersonal relationship, driving, shopping, community activity, and church  PERSONAL FACTORS: Age, Behavior pattern, Education, Fitness, Past/current experiences, Profession, Sex, Time since onset of injury/illness/exacerbation, and 3+ comorbidities: DM with diabetic neuropathy, stroke, HTN, AKI, UTI  are also affecting patient's functional outcome.   REHAB POTENTIAL: Fair length of time since CVA  CLINICAL DECISION MAKING: Evolving/moderate complexity  EVALUATION COMPLEXITY: Moderate  PLAN:  PT FREQUENCY: 2x/week  PT DURATION: 12 weeks  PLANNED INTERVENTIONS: Therapeutic exercises, Therapeutic activity, Neuromuscular re-education, Balance training, Gait training, Patient/Family education, Self Care, Joint mobilization, Stair training, Vestibular training, Canalith repositioning,  Visual/preceptual remediation/compensation, Orthotic/Fit training, DME instructions, Cognitive remediation, Electrical stimulation, Wheelchair mobility training, Spinal mobilization, Cryotherapy, Moist heat, Splintting, Taping, Traction, Ultrasound, Manual therapy, and Re-evaluation  PLAN FOR NEXT SESSION:     Advance gait training intervals/ volume    Norman Herrlich PT  Physical Therapist- [facility]  [facility]   10:22 AM 06/21/23

## 2023-06-23 ENCOUNTER — Ambulatory Visit: Payer: 59 | Admitting: Physical Therapy

## 2023-06-23 DIAGNOSIS — R262 Difficulty in walking, not elsewhere classified: Secondary | ICD-10-CM

## 2023-06-23 DIAGNOSIS — M6281 Muscle weakness (generalized): Secondary | ICD-10-CM

## 2023-06-23 DIAGNOSIS — R2681 Unsteadiness on feet: Secondary | ICD-10-CM

## 2023-06-23 NOTE — Therapy (Signed)
OUTPATIENT PHYSICAL THERAPY TREATMENT     Patient Name: Dana Lucas MRN: [id number] DOB:1951/06/11, 72 y.o., female Today's Date: 06/23/2023   PCP: Bobbye Morton MD REFERRING PROVIDER: Louis Matte MD   END OF SESSION:  PT End of Session - 06/23/23 1029     Visit Number 44    Number of Visits 60    Date for PT Re-Evaluation 08/17/23    Authorization Type UHC Medicare; [place] Medicaid    Authorization Time Period 03/01/23-05/24/23    Progress Note Due on Visit 50    PT Start Time 1013    PT Stop Time 1055    PT Time Calculation (min) 42 min    Equipment Utilized During Treatment Gait belt    Activity Tolerance Patient tolerated treatment well;Patient limited by fatigue    Behavior During Therapy WFL for tasks assessed/performed                            Past Medical History:  Diagnosis Date   Diabetes mellitus without complication (HCC)    Hypertension    Stroke [facility])    Past Surgical History:  Procedure Laterality Date   ABDOMINAL HYSTERECTOMY     COLONOSCOPY N/A 10/06/2019   Procedure: COLONOSCOPY;  Surgeon: Toledo, Boykin Nearing, MD;  Location: [facility] ENDOSCOPY;  Service: Gastroenterology;  Laterality: N/A;   Patient Active Problem List   Diagnosis Date Noted   Hypertension    Diabetes mellitus without complication (HCC)    Stroke (HCC)    GIB (gastrointestinal bleeding)    AKI (acute kidney injury) (HCC)    UTI (urinary tract infection)     ONSET DATE: 2020  REFERRING DIAG: 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   THERAPY DIAG:  Muscle weakness (generalized)  Difficulty in walking, not elsewhere classified  Unsteadiness on feet  Rationale for Evaluation and Treatment: Rehabilitation  SUBJECTIVE:                                                                                                                                                                                             SUBJECTIVE  STATEMENT:   Pt reports she feels  is doing well. Pt got afo fixed and has it donned upon arrival this date.    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 Stroke was four years ago, has had multiple strokes. Had a little bit of therapy, afterwards in [place]. Moved up to [place] about three years ago. Patient  PMH includes DM with diabetic neuropathy, stroke, HTN, AKI, UTI. Uses a walker to walk to the bathroom (two wheel and three wheel walkers).  Has a power chair for out of the house.   PAIN:  Are you having pain? Yes Rt hand, Rt knee 5/10   PRECAUTIONS: Fall  WEIGHT BEARING RESTRICTIONS: No  FALLS: Has patient fallen in last 6 months? No  LIVING ENVIRONMENT: Lives with: lives with their family Lives in: House/apartment Stairs:  ramp outside Has following equipment at home: [name] - 2 wheeled, Environmental consultant - 4 wheeled, Wheelchair (power), shower chair, Ramped entry, and chair lift and hospital bed   PLOF: Independent  PATIENT GOALS: get up and walk by herself, wants to walk with a cane.   OBJECTIVE:     TODAY'S TREATMENT: DATE: 06/23/23  Unless otherwise stated, CGA was provided and gait belt donned in order to ensure pt safety  TE Nustep reciprocal movement and endurance training x 7 min level 2 with cues for full ROM in RUE into full extension as able.   TA Donned 4# AW on LLE Ambulation with AFO donned x 80 ft with RW and frequent asst ( 75% of steps to assist to prevent GR  X 2 rounds   4# AW on LLE // bars stepping over SPC canes 2 x 10 with LLE leading and cues to attempt to keep from touching cane and to keep eyes forward to improve posture  -UE  4# AW on LLE Ambulation over 3 x canes x 4 rounds with LLE leading, consistent GR and cues for keeping eyes forward to improve posture and gait mechanics.  -UE  Ambulation with 4# AW from // bars to power WC x 30 ft with  RW   -cues for LLE placement for optimal performance with each STS transition between therapeutic rest period.   PATIENT EDUCATION: Education details: Pt educated throughout session about proper posture and technique with exercises. Improved exercise technique, movement at target joints, use of target muscles after min to mod verbal, visual, tactile cues.    HOME EXERCISE PROGRAM: continue HEP as previously given Access Code: [email or web address] URL: [email or web address] Date: 06/09/2023 Prepared by: Thresa Ross  Exercises - Leg Extension  - 1 x daily - 7 x weekly - 2 sets - 10 reps - 5 hold - Seated March  - 1 x daily - 7 x weekly - 2 sets - 10 reps - 5 hold - Seated Hip Abduction  - 1 x daily - 7 x weekly - 2 sets - 10 reps - 5 hold - Seated Hip Adduction Isometrics with Ball  - 1 x daily - 7 x weekly - 2 sets - 10 reps - 5 hold - Sit to Stand with Armchair  - 1 x daily - 7 x weekly - 2 sets - 5 reps - Side Stepping with Counter Support  - 1 x daily - 7 x weekly - 2 sets - 10 reps  GOALS: Goals reviewed with patient? Yes  SHORT TERM GOALS: Target date: 06/29/2023    Patient will be independent in home exercise program to improve strength/mobility for better functional independence with ADLs. Baseline: 01/26/2023: pt has some difficulty with HEP 06/09/23: completing 3 x per week ( on days not in therapy) Goal status: MET    LONG TERM GOALS: Target date: 08/17/2023    Patient will increase FOTO score to equal to or greater than 53    to demonstrate statistically significant improvement in mobility and quality  of life.  Baseline: 3/5: 46%; 01/26/23: 45% ; 03/01/23: 42% 04/28/23: 52.5  8/22 54 9/6:53 Goal status: MET  2.  Patient (> 48 years old) will complete five times sit to stand test in < 15 seconds indicating an increased LE strength and improved balance. Baseline: 3/5: 38 seconds with SUE support; blocking of LLE; 01/26/23 18 seconds with pulling to stand;  03/01/23: 17 seconds pull to stand  04/28/23. 21.27(average of 2 trials) BUE on RW and CGA from PT  8/21 17.4sec with BUE support on RW and CGA from PT   06/08/20: 15.94 secB UE support, mostly completes with the R LE, does not get full erect posture each rep  Goal status: IN PROGRESS  3.  Patient will increase 10 meter walk test to >1.79m/s as to improve gait speed for better community ambulation and to reduce fall risk Baseline: 3/5: 1 min 43 seconds with RW and w/c follow; 01/26/2023: 2 min 6 seconds; 03/01/23: 1 min 11 sec, 0.14 m/s with WC follow and 2WW. 04/28/23: 56 sec (average of 2 trials) With 2WW.   8/22: 54.3 sec (average of 3 trials) With 2WW.  9/6:70 sec, 69 sec, 70 sec ( average of 70 sec) Goal status: IN PROGRESS  4.  Patient will increase BLE gross strength to 4/5 as to improve functional strength for independent gait, increased standing tolerance and increased ADL ability. Baseline: 3/5: grossly 2/5 LLE; 4//25/24: RLE grossly 4-/5, LLE grossly 2/5; 03/01/23 RLE grossly 4+/5 and LLE grossly 3+/5, exception 0/5 PF/DF (overall gross Bilat LE strength around 4-/5) 7/26: RLE 4+/5 except 5/5 knee extension. LLE: 4-/5 knee extension, hip adduction 4+/5, all others 3+/5 except ankle DF 2/5 and PF 2-/5  8/22: LLE: hip flexion: 3+,hip adduction 4+, abduction 4, knee extension 4-(delayed activation), knee flexion 4-/5 delayed activation. DF/PF: 1  Goal status: PARTIALLY MET  5.  Patient will increase 2 min walk test by >59ft to indicate improved access to community and safety with gait through house.  Baseline: 36ft with RW, AFO, and CGA from PT 9/6:65 ft with RW  Goal status: ONGOING  ASSESSMENT:  CLINICAL IMPRESSION:   Pt presents to PT motivated to participate. Treatment focused on Bil endurance and strength on nustep as well as functional mobility training with dynamic gait with obstacle navigation. Improved gait pattern with instruction from PT to prevent GR, but still unable to  complete consistently, particularly with obstacles.  Pt will continue to benefit from skilled physical therapy intervention to address impairments, improve QOL, and attain therapy goals.     OBJECTIVE IMPAIRMENTS: Abnormal gait, cardiopulmonary status limiting activity, decreased activity tolerance, decreased balance, decreased coordination, decreased endurance, decreased knowledge of use of DME, decreased mobility, difficulty walking, decreased ROM, decreased strength, impaired perceived functional ability, impaired flexibility, impaired UE functional use, improper body mechanics, postural dysfunction, obesity, and pain.   ACTIVITY LIMITATIONS: carrying, lifting, bending, sitting, standing, squatting, sleeping, stairs, transfers, bed mobility, bathing, toileting, dressing, reach over head, and caring for others  PARTICIPATION LIMITATIONS: meal prep, cleaning, laundry, interpersonal relationship, driving, shopping, community activity, and church  PERSONAL FACTORS: Age, Behavior pattern, Education, Fitness, Past/current experiences, Profession, Sex, Time since onset of injury/illness/exacerbation, and 3+ comorbidities: DM with diabetic neuropathy, stroke, HTN, AKI, UTI  are also affecting patient's functional outcome.   REHAB POTENTIAL: Fair length of time since CVA  CLINICAL DECISION MAKING: Evolving/moderate complexity  EVALUATION COMPLEXITY: Moderate  PLAN:  PT FREQUENCY: 2x/week  PT DURATION: 12 weeks  PLANNED INTERVENTIONS: Therapeutic exercises,  Therapeutic activity, Neuromuscular re-education, Balance training, Gait training, Patient/Family education, Self Care, Joint mobilization, Stair training, Vestibular training, Canalith repositioning, Visual/preceptual remediation/compensation, Orthotic/Fit training, DME instructions, Cognitive remediation, Electrical stimulation, Wheelchair mobility training, Spinal mobilization, Cryotherapy, Moist heat, Splintting, Taping, Traction, Ultrasound,  Manual therapy, and Re-evaluation  PLAN FOR NEXT SESSION:    Advance gait training intervals/ volume    Norman Herrlich PT  Physical Therapist- [facility]  [place] [facility]   10:31 AM 06/23/23

## 2023-06-28 ENCOUNTER — Encounter: Payer: 59 | Admitting: Occupational Therapy

## 2023-06-28 ENCOUNTER — Ambulatory Visit: Payer: 59 | Admitting: Physical Therapy

## 2023-06-28 DIAGNOSIS — M6281 Muscle weakness (generalized): Secondary | ICD-10-CM | POA: Diagnosis not present

## 2023-06-28 DIAGNOSIS — R262 Difficulty in walking, not elsewhere classified: Secondary | ICD-10-CM

## 2023-06-28 DIAGNOSIS — R2681 Unsteadiness on feet: Secondary | ICD-10-CM

## 2023-06-28 NOTE — Therapy (Signed)
OUTPATIENT PHYSICAL THERAPY TREATMENT     Patient Name: Dana Lucas MRN: [id number] DOB:1951-04-26, 72 y.o., female Today's Date: 06/28/2023   PCP: Bobbye Morton MD REFERRING PROVIDER: Louis Matte MD   END OF SESSION:  PT End of Session - 06/28/23 1014     Visit Number 45    Number of Visits 60    Date for PT Re-Evaluation 08/17/23    Authorization Type UHC Medicare; [place] Medicaid    Authorization Time Period 03/01/23-05/24/23    Progress Note Due on Visit 50    PT Start Time 1015    PT Stop Time 1058    PT Time Calculation (min) 43 min    Equipment Utilized During Treatment Gait belt    Activity Tolerance Patient tolerated treatment well;Patient limited by fatigue    Behavior During Therapy WFL for tasks assessed/performed                             Past Medical History:  Diagnosis Date   Diabetes mellitus without complication (HCC)    Hypertension    Stroke [facility])    Past Surgical History:  Procedure Laterality Date   ABDOMINAL HYSTERECTOMY     COLONOSCOPY N/A 10/06/2019   Procedure: COLONOSCOPY;  Surgeon: Toledo, Boykin Nearing, MD;  Location: [facility] ENDOSCOPY;  Service: Gastroenterology;  Laterality: N/A;   Patient Active Problem List   Diagnosis Date Noted   Hypertension    Diabetes mellitus without complication (HCC)    Stroke (HCC)    GIB (gastrointestinal bleeding)    AKI (acute kidney injury) (HCC)    UTI (urinary tract infection)     ONSET DATE: 2020  REFERRING DIAG: 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   THERAPY DIAG:  Difficulty in walking, not elsewhere classified  Unsteadiness on feet  Muscle weakness (generalized)  Rationale for Evaluation and Treatment: Rehabilitation  SUBJECTIVE:                                                                                                                                                                                             SUBJECTIVE  STATEMENT:   Pt reports she feels  is doing well.    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 Stroke was four years ago, has had multiple strokes. Had a little bit of therapy, afterwards in [place]. Moved up to [place] about three years ago. [name] includes DM with diabetic neuropathy, stroke, HTN, AKI, UTI. Uses  a walker to walk to the bathroom (two wheel and three wheel walkers).  Has a power chair for out of the house.   PAIN:  Are you having pain? Yes Rt hand, Rt knee 5/10   PRECAUTIONS: Fall  WEIGHT BEARING RESTRICTIONS: No  FALLS: Has patient fallen in last 6 months? No  LIVING ENVIRONMENT: Lives with: lives with their family Lives in: House/apartment Stairs:  ramp outside Has following equipment at home: [name] - 2 wheeled, Environmental consultant - 4 wheeled, Wheelchair (power), shower chair, Ramped entry, and chair lift and hospital bed   PLOF: Independent  PATIENT GOALS: get up and walk by herself, wants to walk with a cane.   OBJECTIVE:     TODAY'S TREATMENT: DATE: 06/28/23  Unless otherwise stated, CGA was provided and gait belt donned in order to ensure pt safety   Self care  Pt presents to PT following bathroom trip. Pt requires some assistance cleaning up after taking up aseveral minutes of session   TE  Ambulation with AFO and RW 2 x 20 ft .   Seated therex  LAQ with return to start position ( min A) 2 x 10 reps  Bolster roll outs (targeting hamstring curls and controlled knee extension) 2 x 10 reps   Sit to stand 2 x 10 reps, cues for proper foot placement between reps   Standing hip abduction 2 x 10 reps, cues to bring foot to start position each round.   PATIENT EDUCATION: Education details: Pt educated throughout session about proper posture and technique with exercises. Improved exercise technique, movement at target joints, use of target muscles after min  to mod verbal, visual, tactile cues.    HOME EXERCISE PROGRAM: continue HEP as previously given Access Code: [email or web address] URL: [email or web address] Date: 06/09/2023 Prepared by: Thresa Ross  Exercises - Leg Extension  - 1 x daily - 7 x weekly - 2 sets - 10 reps - 5 hold - Seated March  - 1 x daily - 7 x weekly - 2 sets - 10 reps - 5 hold - Seated Hip Abduction  - 1 x daily - 7 x weekly - 2 sets - 10 reps - 5 hold - Seated Hip Adduction Isometrics with Ball  - 1 x daily - 7 x weekly - 2 sets - 10 reps - 5 hold - Sit to Stand with Armchair  - 1 x daily - 7 x weekly - 2 sets - 5 reps - Side Stepping with Counter Support  - 1 x daily - 7 x weekly - 2 sets - 10 reps  GOALS: Goals reviewed with patient? Yes  SHORT TERM GOALS: Target date: 06/29/2023    Patient will be independent in home exercise program to improve strength/mobility for better functional independence with ADLs. Baseline: 01/26/2023: pt has some difficulty with HEP 06/09/23: completing 3 x per week ( on days not in therapy) Goal status: MET    LONG TERM GOALS: Target date: 08/17/2023    Patient will increase FOTO score to equal to or greater than 53    to demonstrate statistically significant improvement in mobility and quality of life.  Baseline: 3/5: 46%; 01/26/23: 45% ; 03/01/23: 42% 04/28/23: 52.5  8/22 54 9/6:53 Goal status: MET  2.  Patient (> 72 years old) will complete five times sit to stand test in < 15 seconds indicating an increased LE strength and improved balance. Baseline: 3/5: 38 seconds with SUE support; blocking of LLE; 01/26/23 18 seconds with pulling to stand;  03/01/23: 17 seconds pull to stand  04/28/23. 21.27(average of 2 trials) BUE on RW and CGA from PT  8/21 17.4sec with BUE support on RW and CGA from PT   06/08/20: 15.94 secB UE support, mostly completes with the R LE, does not get full erect posture each rep  Goal status: IN PROGRESS  3.  Patient will increase 10 meter walk  test to >1.72m/s as to improve gait speed for better community ambulation and to reduce fall risk Baseline: 3/5: 1 min 43 seconds with RW and w/c follow; 01/26/2023: 2 min 6 seconds; 03/01/23: 1 min 11 sec, 0.14 m/s with WC follow and 2WW. 04/28/23: 56 sec (average of 2 trials) With 2WW.   8/22: 54.3 sec (average of 3 trials) With 2WW.  9/6:70 sec, 69 sec, 70 sec ( average of 70 sec) Goal status: IN PROGRESS  4.  Patient will increase BLE gross strength to 4/5 as to improve functional strength for independent gait, increased standing tolerance and increased ADL ability. Baseline: 3/5: grossly 2/5 LLE; 4//25/24: RLE grossly 4-/5, LLE grossly 2/5; 03/01/23 RLE grossly 4+/5 and LLE grossly 3+/5, exception 0/5 PF/DF (overall gross Bilat LE strength around 4-/5) 7/26: RLE 4+/5 except 5/5 knee extension. LLE: 4-/5 knee extension, hip adduction 4+/5, all others 3+/5 except ankle DF 2/5 and PF 2-/5  8/22: LLE: hip flexion: 3+,hip adduction 4+, abduction 4, knee extension 4-(delayed activation), knee flexion 4-/5 delayed activation. DF/PF: 1  Goal status: PARTIALLY MET  5.  Patient will increase 2 min walk test by >50ft to indicate improved access to community and safety with gait through house.  Baseline: 71ft with RW, AFO, and CGA from PT 9/6:65 ft with RW  Goal status: ONGOING  ASSESSMENT:  CLINICAL IMPRESSION:   Pt presents to PT motivated to participate. Pt requires assistance with bathroom related cleanup using some of initial portion of session. Pt completes mostly seated therex secondary to above complications.  Pt will continue to benefit from skilled physical therapy intervention to address impairments, improve QOL, and attain therapy goals.     OBJECTIVE IMPAIRMENTS: Abnormal gait, cardiopulmonary status limiting activity, decreased activity tolerance, decreased balance, decreased coordination, decreased endurance, decreased knowledge of use of DME, decreased mobility, difficulty walking,  decreased ROM, decreased strength, impaired perceived functional ability, impaired flexibility, impaired UE functional use, improper body mechanics, postural dysfunction, obesity, and pain.   ACTIVITY LIMITATIONS: carrying, lifting, bending, sitting, standing, squatting, sleeping, stairs, transfers, bed mobility, bathing, toileting, dressing, reach over head, and caring for others  PARTICIPATION LIMITATIONS: meal prep, cleaning, laundry, interpersonal relationship, driving, shopping, community activity, and church  PERSONAL FACTORS: Age, Behavior pattern, Education, Fitness, Past/current experiences, Profession, Sex, Time since onset of injury/illness/exacerbation, and 3+ comorbidities: DM with diabetic neuropathy, stroke, HTN, AKI, UTI  are also affecting patient's functional outcome.   REHAB POTENTIAL: Fair length of time since CVA  CLINICAL DECISION MAKING: Evolving/moderate complexity  EVALUATION COMPLEXITY: Moderate  PLAN:  PT FREQUENCY: 2x/week  PT DURATION: 12 weeks  PLANNED INTERVENTIONS: Therapeutic exercises, Therapeutic activity, Neuromuscular re-education, Balance training, Gait training, Patient/Family education, Self Care, Joint mobilization, Stair training, Vestibular training, Canalith repositioning, Visual/preceptual remediation/compensation, Orthotic/Fit training, DME instructions, Cognitive remediation, Electrical stimulation, Wheelchair mobility training, Spinal mobilization, Cryotherapy, Moist heat, Splintting, Taping, Traction, Ultrasound, Manual therapy, and Re-evaluation  PLAN FOR NEXT SESSION:    Advance gait training intervals/ volume    Norman Herrlich PT  Physical Therapist- [facility]  [facility]   10:18 AM 06/28/23

## 2023-06-30 ENCOUNTER — Ambulatory Visit: Payer: 59 | Admitting: Physical Therapy

## 2023-07-05 ENCOUNTER — Encounter: Payer: Self-pay | Admitting: Physical Therapy

## 2023-07-05 ENCOUNTER — Encounter: Payer: 59 | Admitting: Occupational Therapy

## 2023-07-05 ENCOUNTER — Ambulatory Visit: Payer: 59 | Attending: Internal Medicine | Admitting: Physical Therapy

## 2023-07-05 DIAGNOSIS — R2681 Unsteadiness on feet: Secondary | ICD-10-CM | POA: Diagnosis present

## 2023-07-05 DIAGNOSIS — M6281 Muscle weakness (generalized): Secondary | ICD-10-CM | POA: Insufficient documentation

## 2023-07-05 DIAGNOSIS — M25561 Pain in right knee: Secondary | ICD-10-CM | POA: Diagnosis present

## 2023-07-05 DIAGNOSIS — R278 Other lack of coordination: Secondary | ICD-10-CM | POA: Diagnosis present

## 2023-07-05 DIAGNOSIS — R262 Difficulty in walking, not elsewhere classified: Secondary | ICD-10-CM | POA: Insufficient documentation

## 2023-07-05 DIAGNOSIS — G8929 Other chronic pain: Secondary | ICD-10-CM | POA: Insufficient documentation

## 2023-07-05 NOTE — Therapy (Signed)
OUTPATIENT PHYSICAL THERAPY TREATMENT     Patient Name: Dana Lucas MRN: [id number] DOB:14-Mar-1951, 72 y.o., female Today's Date: 07/05/2023   PCP: Bobbye Morton MD REFERRING PROVIDER: Louis Matte MD   END OF SESSION:  PT End of Session - 07/05/23 1114     Visit Number 46    Number of Visits 60    Date for PT Re-Evaluation 08/17/23    Authorization Type UHC Medicare; [place] Medicaid    Authorization Time Period 03/01/23-05/24/23    Progress Note Due on Visit 50    PT Start Time 0930    PT Stop Time 1013    PT Time Calculation (min) 43 min    Equipment Utilized During Treatment Gait belt    Activity Tolerance Patient tolerated treatment well;Patient limited by fatigue    Behavior During Therapy WFL for tasks assessed/performed                              Past Medical History:  Diagnosis Date   Diabetes mellitus without complication (HCC)    Hypertension    Stroke [facility])    Past Surgical History:  Procedure Laterality Date   ABDOMINAL HYSTERECTOMY     COLONOSCOPY N/A 10/06/2019   Procedure: COLONOSCOPY;  Surgeon: Toledo, Boykin Nearing, MD;  Location: [facility] ENDOSCOPY;  Service: Gastroenterology;  Laterality: N/A;   Patient Active Problem List   Diagnosis Date Noted   Hypertension    Diabetes mellitus without complication (HCC)    Stroke (HCC)    GIB (gastrointestinal bleeding)    AKI (acute kidney injury) (HCC)    UTI (urinary tract infection)     ONSET DATE: 2020  REFERRING DIAG: 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   THERAPY DIAG:  Difficulty in walking, not elsewhere classified  Unsteadiness on feet  Muscle weakness (generalized)  Rationale for Evaluation and Treatment: Rehabilitation  SUBJECTIVE:                                                                                                                                                                                             SUBJECTIVE  STATEMENT:   Pt reports she feels  is doing well. No changes since last session. Pt has new AFO donned.    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 Stroke was four years ago, has had multiple strokes. Had a little bit of therapy, afterwards in [place]. Moved up to [place] about three years ago. Patient  PMH includes DM with diabetic neuropathy, stroke, HTN, AKI, UTI. Uses a walker to walk to the bathroom (two wheel and three wheel walkers).  Has a power chair for out of the house.   PAIN:  Are you having pain? Yes Rt hand, Rt knee 5/10   PRECAUTIONS: Fall  WEIGHT BEARING RESTRICTIONS: No  FALLS: Has patient fallen in last 6 months? No  LIVING ENVIRONMENT: Lives with: lives with their family Lives in: House/apartment Stairs:  ramp outside Has following equipment at home: [name] - 2 wheeled, Environmental consultant - 4 wheeled, Wheelchair (power), shower chair, Ramped entry, and chair lift and hospital bed   PLOF: Independent  PATIENT GOALS: get up and walk by herself, wants to walk with a cane.   OBJECTIVE:     TODAY'S TREATMENT: DATE: 07/05/23  Unless otherwise stated, CGA was provided and gait belt donned in order to ensure pt safety   TE  Ambulation with AFO and RW 2 x 24ft .   Seated therex   Bolster roll outs (targeting hamstring curls and controlled knee extension) 2 x 10 reps   Sit to stand 3*5 reps with no UE assist from walker in standing, focus on weight shift to the left with stand and sit portion of exercise.   Walking over 2 x canes, alternating which foot leads, cues for proper form when completing with involved LE training.   Ambulation from // bars to WC ( 20 ft)   Unless otherwise stated, CGA was provided and gait belt donned in order to ensure pt safety   PATIENT EDUCATION: Education details: Pt educated throughout session about proper posture and technique with  exercises. Improved exercise technique, movement at target joints, use of target muscles after min to mod verbal, visual, tactile cues.    HOME EXERCISE PROGRAM: continue HEP as previously given Access Code: [email or web address] URL: [email or web address] Date: 06/09/2023 Prepared by: Thresa Ross  Exercises - Leg Extension  - 1 x daily - 7 x weekly - 2 sets - 10 reps - 5 hold - Seated March  - 1 x daily - 7 x weekly - 2 sets - 10 reps - 5 hold - Seated Hip Abduction  - 1 x daily - 7 x weekly - 2 sets - 10 reps - 5 hold - Seated Hip Adduction Isometrics with Ball  - 1 x daily - 7 x weekly - 2 sets - 10 reps - 5 hold - Sit to Stand with Armchair  - 1 x daily - 7 x weekly - 2 sets - 5 reps - Side Stepping with Counter Support  - 1 x daily - 7 x weekly - 2 sets - 10 reps  GOALS: Goals reviewed with patient? Yes  SHORT TERM GOALS: Target date: 06/29/2023    Patient will be independent in home exercise program to improve strength/mobility for better functional independence with ADLs. Baseline: 01/26/2023: pt has some difficulty with HEP 06/09/23: completing 3 x per week ( on days not in therapy) Goal status: MET    LONG TERM GOALS: Target date: 08/17/2023    Patient will increase FOTO score to equal to or greater than 53    to demonstrate statistically significant improvement in mobility and quality of life.  Baseline: 3/5: 46%; 01/26/23: 45% ; 03/01/23: 42% 04/28/23: 52.5  8/22 54 9/6:53 Goal status: MET  2.  Patient (> 35 years old) will complete five times sit to stand test in < 15 seconds indicating an increased LE strength and improved balance.  Baseline: 3/5: 38 seconds with SUE support; blocking of LLE; 01/26/23 18 seconds with pulling to stand; 03/01/23: 17 seconds pull to stand  04/28/23. 21.27(average of 2 trials) BUE on RW and CGA from PT  8/21 17.4sec with BUE support on RW and CGA from PT   06/08/20: 15.94 secB UE support, mostly completes with the R LE, does not get  full erect posture each rep  Goal status: IN PROGRESS  3.  Patient will increase 10 meter walk test to >1.29m/s as to improve gait speed for better community ambulation and to reduce fall risk Baseline: 3/5: 1 min 43 seconds with RW and w/c follow; 01/26/2023: 2 min 6 seconds; 03/01/23: 1 min 11 sec, 0.14 m/s with WC follow and 2WW. 04/28/23: 56 sec (average of 2 trials) With 2WW.   8/22: 54.3 sec (average of 3 trials) With 2WW.  9/6:70 sec, 69 sec, 70 sec ( average of 70 sec) Goal status: IN PROGRESS  4.  Patient will increase BLE gross strength to 4/5 as to improve functional strength for independent gait, increased standing tolerance and increased ADL ability. Baseline: 3/5: grossly 2/5 LLE; 4//25/24: RLE grossly 4-/5, LLE grossly 2/5; 03/01/23 RLE grossly 4+/5 and LLE grossly 3+/5, exception 0/5 PF/DF (overall gross Bilat LE strength around 4-/5) 7/26: RLE 4+/5 except 5/5 knee extension. LLE: 4-/5 knee extension, hip adduction 4+/5, all others 3+/5 except ankle DF 2/5 and PF 2-/5  8/22: LLE: hip flexion: 3+,hip adduction 4+, abduction 4, knee extension 4-(delayed activation), knee flexion 4-/5 delayed activation. DF/PF: 1  Goal status: PARTIALLY MET  5.  Patient will increase 2 min walk test by >75ft to indicate improved access to community and safety with gait through house.  Baseline: 104ft with RW, AFO, and CGA from PT 9/6:65 ft with RW  Goal status: ONGOING  ASSESSMENT:  CLINICAL IMPRESSION:   Pt presents to PT motivated to participate. Pt continues with high dose ambulation and LE functional strengthening. With cues and practice pt showed improved form and safety with STS transitions. Further practice likely needed for meaningful carryover. Pt also progressing with gait but GR in the knee has been unable to be corrected with cues. Pt will continue to benefit from skilled physical therapy intervention to address impairments, improve QOL, and attain therapy goals.     OBJECTIVE  IMPAIRMENTS: Abnormal gait, cardiopulmonary status limiting activity, decreased activity tolerance, decreased balance, decreased coordination, decreased endurance, decreased knowledge of use of DME, decreased mobility, difficulty walking, decreased ROM, decreased strength, impaired perceived functional ability, impaired flexibility, impaired UE functional use, improper body mechanics, postural dysfunction, obesity, and pain.   ACTIVITY LIMITATIONS: carrying, lifting, bending, sitting, standing, squatting, sleeping, stairs, transfers, bed mobility, bathing, toileting, dressing, reach over head, and caring for others  PARTICIPATION LIMITATIONS: meal prep, cleaning, laundry, interpersonal relationship, driving, shopping, community activity, and church  PERSONAL FACTORS: Age, Behavior pattern, Education, Fitness, Past/current experiences, Profession, Sex, Time since onset of injury/illness/exacerbation, and 3+ comorbidities: DM with diabetic neuropathy, stroke, HTN, AKI, UTI  are also affecting patient's functional outcome.   REHAB POTENTIAL: Fair length of time since CVA  CLINICAL DECISION MAKING: Evolving/moderate complexity  EVALUATION COMPLEXITY: Moderate  PLAN:  PT FREQUENCY: 2x/week  PT DURATION: 12 weeks  PLANNED INTERVENTIONS: Therapeutic exercises, Therapeutic activity, Neuromuscular re-education, Balance training, Gait training, Patient/Family education, Self Care, Joint mobilization, Stair training, Vestibular training, Canalith repositioning, Visual/preceptual remediation/compensation, Orthotic/Fit training, DME instructions, Cognitive remediation, Electrical stimulation, Wheelchair mobility training, Spinal mobilization, Cryotherapy, Moist heat, Splintting, Taping, Traction,  Ultrasound, Manual therapy, and Re-evaluation  PLAN FOR NEXT SESSION:    Advance gait training intervals/ volume    Norman Herrlich PT  Physical Therapist- [facility]  [facility]    11:29 AM 07/05/23

## 2023-07-06 NOTE — Therapy (Deleted)
OUTPATIENT PHYSICAL THERAPY TREATMENT     Patient Name: Dana Lucas MRN: [id number] DOB:November 25, 1950, 72 y.o., female Today's Date: 07/06/2023   PCP: Bobbye Morton MD REFERRING PROVIDER: Louis Matte MD   END OF SESSION:                     Past Medical History:  Diagnosis Date   Diabetes mellitus without complication (HCC)    Hypertension    Stroke [facility])    Past Surgical History:  Procedure Laterality Date   ABDOMINAL HYSTERECTOMY     COLONOSCOPY N/A 10/06/2019   Procedure: COLONOSCOPY;  Surgeon: Toledo, Boykin Nearing, MD;  Location: [facility] ENDOSCOPY;  Service: Gastroenterology;  Laterality: N/A;   Patient Active Problem List   Diagnosis Date Noted   Hypertension    Diabetes mellitus without complication (HCC)    Stroke (HCC)    GIB (gastrointestinal bleeding)    AKI (acute kidney injury) (HCC)    UTI (urinary tract infection)     ONSET DATE: 2020  REFERRING DIAG: 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   THERAPY DIAG:  No diagnosis found.  Rationale for Evaluation and Treatment: Rehabilitation  SUBJECTIVE:                                                                                                                                                                                             SUBJECTIVE STATEMENT:   Pt reports she feels  is doing well. No changes since last session. Pt has new AFO donned.    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 Stroke was four years ago, has had multiple strokes. Had a little bit of therapy, afterwards in [place]. Moved up to [place] about three years ago. [name] includes DM with diabetic neuropathy, stroke, HTN, AKI, UTI. Uses a walker to walk to the bathroom (two wheel and three wheel walkers).  Has a power chair for out of the house.   PAIN:  Are you having  pain? Yes Rt hand, Rt knee 5/10   PRECAUTIONS: Fall  WEIGHT BEARING RESTRICTIONS: No  FALLS: Has patient fallen in last 6 months? No  LIVING ENVIRONMENT: Lives with: lives with their family Lives in: House/apartment Stairs:  ramp outside Has following equipment at home: [name] - 2 wheeled, Environmental consultant - 4 wheeled, Wheelchair (power), shower chair, Ramped entry, and chair lift and hospital bed   PLOF: Independent  PATIENT GOALS: get up and walk by herself, wants to walk with a cane.  OBJECTIVE:     TODAY'S TREATMENT: DATE: 07/06/23  Unless otherwise stated, CGA was provided and gait belt donned in order to ensure pt safety   TE  Ambulation with AFO and RW 2 x 22ft .   Seated therex   Bolster roll outs (targeting hamstring curls and controlled knee extension) 2 x 10 reps   Sit to stand 3*5 reps with no UE assist from walker in standing, focus on weight shift to the left with stand and sit portion of exercise.   Walking over 2 x canes, alternating which foot leads, cues for proper form when completing with involved LE training.   Ambulation from // bars to WC ( 20 ft)   Unless otherwise stated, CGA was provided and gait belt donned in order to ensure pt safety   PATIENT EDUCATION: Education details: Pt educated throughout session about proper posture and technique with exercises. Improved exercise technique, movement at target joints, use of target muscles after min to mod verbal, visual, tactile cues.    HOME EXERCISE PROGRAM: continue HEP as previously given Access Code: [email or web address] URL: [email or web address] Date: 06/09/2023 Prepared by: Thresa Ross  Exercises - Leg Extension  - 1 x daily - 7 x weekly - 2 sets - 10 reps - 5 hold - Seated March  - 1 x daily - 7 x weekly - 2 sets - 10 reps - 5 hold - Seated Hip Abduction  - 1 x daily - 7 x weekly - 2 sets - 10 reps - 5 hold - Seated Hip Adduction Isometrics with Ball  - 1 x daily - 7 x weekly - 2  sets - 10 reps - 5 hold - Sit to Stand with Armchair  - 1 x daily - 7 x weekly - 2 sets - 5 reps - Side Stepping with Counter Support  - 1 x daily - 7 x weekly - 2 sets - 10 reps  GOALS: Goals reviewed with patient? Yes  SHORT TERM GOALS: Target date: 06/29/2023    Patient will be independent in home exercise program to improve strength/mobility for better functional independence with ADLs. Baseline: 01/26/2023: pt has some difficulty with HEP 06/09/23: completing 3 x per week ( on days not in therapy) Goal status: MET    LONG TERM GOALS: Target date: 08/17/2023    Patient will increase FOTO score to equal to or greater than 53    to demonstrate statistically significant improvement in mobility and quality of life.  Baseline: 3/5: 46%; 01/26/23: 45% ; 03/01/23: 42% 04/28/23: 52.5  8/22 54 9/6:53 Goal status: MET  2.  Patient (> 57 years old) will complete five times sit to stand test in < 15 seconds indicating an increased LE strength and improved balance. Baseline: 3/5: 38 seconds with SUE support; blocking of LLE; 01/26/23 18 seconds with pulling to stand; 03/01/23: 17 seconds pull to stand  04/28/23. 21.27(average of 2 trials) BUE on RW and CGA from PT  8/21 17.4sec with BUE support on RW and CGA from PT   06/08/20: 15.94 secB UE support, mostly completes with the R LE, does not get full erect posture each rep  Goal status: IN PROGRESS  3.  Patient will increase 10 meter walk test to >1.69m/s as to improve gait speed for better community ambulation and to reduce fall risk Baseline: 3/5: 1 min 43 seconds with RW and w/c follow; 01/26/2023: 2 min 6 seconds; 03/01/23: 1 min 11 sec, 0.14 m/s with WC follow and 2WW. 04/28/23:  56 sec (average of 2 trials) With 2WW.   8/22: 54.3 sec (average of 3 trials) With 2WW.  9/6:70 sec, 69 sec, 70 sec ( average of 70 sec) Goal status: IN PROGRESS  4.  Patient will increase BLE gross strength to 4/5 as to improve functional strength for independent gait,  increased standing tolerance and increased ADL ability. Baseline: 3/5: grossly 2/5 LLE; 4//25/24: RLE grossly 4-/5, LLE grossly 2/5; 03/01/23 RLE grossly 4+/5 and LLE grossly 3+/5, exception 0/5 PF/DF (overall gross Bilat LE strength around 4-/5) 7/26: RLE 4+/5 except 5/5 knee extension. LLE: 4-/5 knee extension, hip adduction 4+/5, all others 3+/5 except ankle DF 2/5 and PF 2-/5  8/22: LLE: hip flexion: 3+,hip adduction 4+, abduction 4, knee extension 4-(delayed activation), knee flexion 4-/5 delayed activation. DF/PF: 1  Goal status: PARTIALLY MET  5.  Patient will increase 2 min walk test by >52ft to indicate improved access to community and safety with gait through house.  Baseline: 71ft with RW, AFO, and CGA from PT 9/6:65 ft with RW  Goal status: ONGOING  ASSESSMENT:  CLINICAL IMPRESSION:   Pt presents to PT motivated to participate. Pt continues with high dose ambulation and LE functional strengthening. With cues and practice pt showed improved form and safety with STS transitions. Further practice likely needed for meaningful carryover. Pt also progressing with gait but GR in the knee has been unable to be corrected with cues. Pt will continue to benefit from skilled physical therapy intervention to address impairments, improve QOL, and attain therapy goals.   OBJECTIVE IMPAIRMENTS: Abnormal gait, cardiopulmonary status limiting activity, decreased activity tolerance, decreased balance, decreased coordination, decreased endurance, decreased knowledge of use of DME, decreased mobility, difficulty walking, decreased ROM, decreased strength, impaired perceived functional ability, impaired flexibility, impaired UE functional use, improper body mechanics, postural dysfunction, obesity, and pain.   ACTIVITY LIMITATIONS: carrying, lifting, bending, sitting, standing, squatting, sleeping, stairs, transfers, bed mobility, bathing, toileting, dressing, reach over head, and caring for  others  PARTICIPATION LIMITATIONS: meal prep, cleaning, laundry, interpersonal relationship, driving, shopping, community activity, and church  PERSONAL FACTORS: Age, Behavior pattern, Education, Fitness, Past/current experiences, Profession, Sex, Time since onset of injury/illness/exacerbation, and 3+ comorbidities: DM with diabetic neuropathy, stroke, HTN, AKI, UTI  are also affecting patient's functional outcome.   REHAB POTENTIAL: Fair length of time since CVA  CLINICAL DECISION MAKING: Evolving/moderate complexity  EVALUATION COMPLEXITY: Moderate  PLAN:  PT FREQUENCY: 2x/week  PT DURATION: 12 weeks  PLANNED INTERVENTIONS: Therapeutic exercises, Therapeutic activity, Neuromuscular re-education, Balance training, Gait training, Patient/Family education, Self Care, Joint mobilization, Stair training, Vestibular training, Canalith repositioning, Visual/preceptual remediation/compensation, Orthotic/Fit training, DME instructions, Cognitive remediation, Electrical stimulation, Wheelchair mobility training, Spinal mobilization, Cryotherapy, Moist heat, Splintting, Taping, Traction, Ultrasound, Manual therapy, and Re-evaluation  PLAN FOR NEXT SESSION:    Advance gait training intervals/ volume    Norman Herrlich PT  Physical Therapist- [place]  [facility]   4:46 PM 07/06/23

## 2023-07-07 ENCOUNTER — Ambulatory Visit: Payer: 59

## 2023-07-07 DIAGNOSIS — R262 Difficulty in walking, not elsewhere classified: Secondary | ICD-10-CM

## 2023-07-07 DIAGNOSIS — R278 Other lack of coordination: Secondary | ICD-10-CM

## 2023-07-07 DIAGNOSIS — M6281 Muscle weakness (generalized): Secondary | ICD-10-CM

## 2023-07-07 DIAGNOSIS — G8929 Other chronic pain: Secondary | ICD-10-CM

## 2023-07-07 DIAGNOSIS — R2681 Unsteadiness on feet: Secondary | ICD-10-CM

## 2023-07-07 NOTE — Therapy (Signed)
OUTPATIENT PHYSICAL THERAPY TREATMENT     Patient Name: Dana Lucas MRN: [id number] DOB:1951/08/15, 72 y.o., female Today's Date: 07/07/2023   PCP: Bobbye Morton MD REFERRING PROVIDER: Louis Matte MD   END OF SESSION:  PT End of Session - 07/07/23 1016     Visit Number 47    Number of Visits 60    Date for PT Re-Evaluation 08/17/23    Authorization Type [employer] Medicare;  Medicaid    Authorization Time Period 03/01/23-05/24/23    Progress Note Due on Visit 50    PT Start Time 1014    PT Stop Time 1058    PT Time Calculation (min) 44 min    Equipment Utilized During Treatment Gait belt    Activity Tolerance Patient tolerated treatment well;Patient limited by fatigue    Behavior During Therapy WFL for tasks assessed/performed                              Past Medical History:  Diagnosis Date   Diabetes mellitus without complication (HCC)    Hypertension    Stroke [facility])    Past Surgical History:  Procedure Laterality Date   ABDOMINAL HYSTERECTOMY     COLONOSCOPY N/A 10/06/2019   Procedure: COLONOSCOPY;  Surgeon: Toledo, Boykin Nearing, MD;  Location: [facility] ENDOSCOPY;  Service: Gastroenterology;  Laterality: N/A;   Patient Active Problem List   Diagnosis Date Noted   Hypertension    Diabetes mellitus without complication (HCC)    Stroke (HCC)    GIB (gastrointestinal bleeding)    AKI (acute kidney injury) (HCC)    UTI (urinary tract infection)     ONSET DATE: 2020  REFERRING DIAG: 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   THERAPY DIAG:  Difficulty in walking, not elsewhere classified  Unsteadiness on feet  Muscle weakness (generalized)  Chronic pain of right knee  Other lack of coordination  Rationale for Evaluation and Treatment: Rehabilitation  SUBJECTIVE:                                                                                                                                                                                              SUBJECTIVE STATEMENT:   Pt reports she thinks she is walking better and def getting up easier.    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 Stroke was four years ago, has had multiple strokes. Had a little bit of therapy, afterwards in [place]. Moved up to  [place] about three years ago. [name] includes DM with diabetic neuropathy, stroke, HTN, AKI, UTI. Uses a walker to walk to the bathroom (two wheel and three wheel walkers).  Has a power chair for out of the house.   PAIN:  Are you having pain? Yes Rt hand, Rt knee 5/10   PRECAUTIONS: Fall  WEIGHT BEARING RESTRICTIONS: No  FALLS: Has patient fallen in last 6 months? No  LIVING ENVIRONMENT: Lives with: lives with their family Lives in: House/apartment Stairs:  ramp outside Has following equipment at home: [name] - 2 wheeled, Environmental consultant - 4 wheeled, Wheelchair (power), shower chair, Ramped entry, and chair lift and hospital bed   PLOF: Independent  PATIENT GOALS: get up and walk by herself, wants to walk with a cane.   OBJECTIVE:     TODAY'S TREATMENT: DATE: 07/07/23  Unless otherwise stated, CGA was provided and gait belt donned in order to ensure pt safety   TE  Ambulation with AFO and RW 100 ft  x 2 (short reciprocal steps and No LOB today)   Seated therex:   Ham curls using red disc on floor (targeting hamstring curls and controlled knee extension) 2 x 10 reps   Seated knee ext (L) - 2 sets x 10 reps (VC for slow controlled motion  Sit to stand 5 reps with no UE assist (arms crossed) with CGA   Dynamic step tap onto 6" block with BUE Support x 20 reps each LE     THEREAPEUTIC ACTIVITIES:   SPT from wheelchair to chair- x 6 as patient had to go to bathroom (able to perform bathroom transfers independently)   Unless otherwise stated, CGA was provided and gait  belt donned in order to ensure pt safety   PATIENT EDUCATION: Education details: Pt educated throughout session about proper posture and technique with exercises. Improved exercise technique, movement at target joints, use of target muscles after min to mod verbal, visual, tactile cues.    HOME EXERCISE PROGRAM: continue HEP as previously given Access Code: [email or web address] URL: [email or web address] Date: 06/09/2023 Prepared by: Thresa Ross  Exercises - Leg Extension  - 1 x daily - 7 x weekly - 2 sets - 10 reps - 5 hold - Seated March  - 1 x daily - 7 x weekly - 2 sets - 10 reps - 5 hold - Seated Hip Abduction  - 1 x daily - 7 x weekly - 2 sets - 10 reps - 5 hold - Seated Hip Adduction Isometrics with Ball  - 1 x daily - 7 x weekly - 2 sets - 10 reps - 5 hold - Sit to Stand with Armchair  - 1 x daily - 7 x weekly - 2 sets - 5 reps - Side Stepping with Counter Support  - 1 x daily - 7 x weekly - 2 sets - 10 reps  GOALS: Goals reviewed with patient? Yes  SHORT TERM GOALS: Target date: 06/29/2023    Patient will be independent in home exercise program to improve strength/mobility for better functional independence with ADLs. Baseline: 01/26/2023: pt has some difficulty with HEP 06/09/23: completing 3 x per week ( on days not in therapy) Goal status: MET    LONG TERM GOALS: Target date: 08/17/2023    Patient will increase FOTO score to equal to or greater than 53    to demonstrate statistically significant improvement in mobility and quality of life.  Baseline: 3/5: 46%; 01/26/23: 45% ; 03/01/23: 42% 04/28/23: 52.5  8/22 54  9/6:53 Goal status: MET  2.  Patient (> 11 years old) will complete five times sit to stand test in < 15 seconds indicating an increased LE strength and improved balance. Baseline: 3/5: 38 seconds with SUE support; blocking of LLE; 01/26/23 18 seconds with pulling to stand; 03/01/23: 17 seconds pull to stand  04/28/23. 21.27(average of 2 trials) BUE  on RW and CGA from PT  8/21 17.4sec with BUE support on RW and CGA from PT   06/08/20: 15.94 secB UE support, mostly completes with the R LE, does not get full erect posture each rep  Goal status: IN PROGRESS  3.  Patient will increase 10 meter walk test to >1.67m/s as to improve gait speed for better community ambulation and to reduce fall risk Baseline: 3/5: 1 min 43 seconds with RW and w/c follow; 01/26/2023: 2 min 6 seconds; 03/01/23: 1 min 11 sec, 0.14 m/s with WC follow and 2WW. 04/28/23: 56 sec (average of 2 trials) With 2WW.   8/22: 54.3 sec (average of 3 trials) With 2WW.  9/6:70 sec, 69 sec, 70 sec ( average of 70 sec) Goal status: IN PROGRESS  4.  Patient will increase BLE gross strength to 4/5 as to improve functional strength for independent gait, increased standing tolerance and increased ADL ability. Baseline: 3/5: grossly 2/5 LLE; 4//25/24: RLE grossly 4-/5, LLE grossly 2/5; 03/01/23 RLE grossly 4+/5 and LLE grossly 3+/5, exception 0/5 PF/DF (overall gross Bilat LE strength around 4-/5) 7/26: RLE 4+/5 except 5/5 knee extension. LLE: 4-/5 knee extension, hip adduction 4+/5, all others 3+/5 except ankle DF 2/5 and PF 2-/5  8/22: LLE: hip flexion: 3+,hip adduction 4+, abduction 4, knee extension 4-(delayed activation), knee flexion 4-/5 delayed activation. DF/PF: 1  Goal status: PARTIALLY MET  5.  Patient will increase 2 min walk test by >63ft to indicate improved access to community and safety with gait through house.  Baseline: 69ft with RW, AFO, and CGA from PT 9/6:65 ft with RW  Goal status: ONGOING  ASSESSMENT:  CLINICAL IMPRESSION:  Treatment continued per plan of care- continued to [facility] improved  transfer form and safety with STS transitions. She continues to present with good overall progress with gait - minimal VC today and even able to stand without UE Support today. Patient states encouraged by overall progress.  Pt will continue to benefit from skilled physical therapy  intervention to address impairments, improve QOL, and attain therapy goals.     OBJECTIVE IMPAIRMENTS: Abnormal gait, cardiopulmonary status limiting activity, decreased activity tolerance, decreased balance, decreased coordination, decreased endurance, decreased knowledge of use of DME, decreased mobility, difficulty walking, decreased ROM, decreased strength, impaired perceived functional ability, impaired flexibility, impaired UE functional use, improper body mechanics, postural dysfunction, obesity, and pain.   ACTIVITY LIMITATIONS: carrying, lifting, bending, sitting, standing, squatting, sleeping, stairs, transfers, bed mobility, bathing, toileting, dressing, reach over head, and caring for others  PARTICIPATION LIMITATIONS: meal prep, cleaning, laundry, interpersonal relationship, driving, shopping, community activity, and church  PERSONAL FACTORS: Age, Behavior pattern, Education, Fitness, Past/current experiences, Profession, Sex, Time since onset of injury/illness/exacerbation, and 3+ comorbidities: DM with diabetic neuropathy, stroke, HTN, AKI, UTI  are also affecting patient's functional outcome.   REHAB POTENTIAL: Fair length of time since CVA  CLINICAL DECISION MAKING: Evolving/moderate complexity  EVALUATION COMPLEXITY: Moderate  PLAN:  PT FREQUENCY: 2x/week  PT DURATION: 12 weeks  PLANNED INTERVENTIONS: Therapeutic exercises, Therapeutic activity, Neuromuscular re-education, Balance training, Gait training, Patient/Family education, Self Care, Joint mobilization, Stair training, Vestibular  training, Canalith repositioning, Visual/preceptual remediation/compensation, Orthotic/Fit training, DME instructions, Cognitive remediation, Electrical stimulation, Wheelchair mobility training, Spinal mobilization, Cryotherapy, Moist heat, Splintting, Taping, Traction, Ultrasound, Manual therapy, and Re-evaluation  PLAN FOR NEXT SESSION:    Advance gait training intervals/ volume     Lenda Kelp PT  Physical Therapist- [facility]   11:06 AM 07/07/23

## 2023-07-12 ENCOUNTER — Encounter: Payer: Self-pay | Admitting: Physical Therapy

## 2023-07-12 ENCOUNTER — Ambulatory Visit: Payer: 59 | Admitting: Physical Therapy

## 2023-07-12 ENCOUNTER — Encounter: Payer: 59 | Admitting: Occupational Therapy

## 2023-07-12 DIAGNOSIS — R2681 Unsteadiness on feet: Secondary | ICD-10-CM

## 2023-07-12 DIAGNOSIS — R262 Difficulty in walking, not elsewhere classified: Secondary | ICD-10-CM | POA: Diagnosis not present

## 2023-07-12 DIAGNOSIS — M6281 Muscle weakness (generalized): Secondary | ICD-10-CM

## 2023-07-12 NOTE — Therapy (Signed)
OUTPATIENT PHYSICAL THERAPY TREATMENT     Patient Name: Dana Lucas MRN: [id number] DOB:[birth year], 72 y.o., female Today's Date: 07/12/2023   PCP: Bobbye Morton MD REFERRING PROVIDER: Louis Matte MD   END OF SESSION:  PT End of Session - 07/12/23 0928     Visit Number 48    Number of Visits 60    Date for PT Re-Evaluation 08/17/23    Authorization Type UHC Medicare; [place] Medicaid    Authorization Time Period 03/01/23-05/24/23    Progress Note Due on Visit 50    PT Start Time 0933    PT Stop Time 1014    PT Time Calculation (min) 41 min    Equipment Utilized During Treatment Gait belt    Activity Tolerance Patient tolerated treatment well;Patient limited by fatigue    Behavior During Therapy WFL for tasks assessed/performed                              Past Medical History:  Diagnosis Date   Diabetes mellitus without complication (HCC)    Hypertension    Stroke [facility])    Past Surgical History:  Procedure Laterality Date   ABDOMINAL HYSTERECTOMY     COLONOSCOPY N/A 10/06/2019   Procedure: COLONOSCOPY;  Surgeon: Toledo, Boykin Nearing, MD;  Location: [facility] ENDOSCOPY;  Service: Gastroenterology;  Laterality: N/A;   Patient Active Problem List   Diagnosis Date Noted   Hypertension    Diabetes mellitus without complication (HCC)    Stroke (HCC)    GIB (gastrointestinal bleeding)    AKI (acute kidney injury) (HCC)    UTI (urinary tract infection)     ONSET DATE: 2020  REFERRING DIAG: 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   THERAPY DIAG:  Difficulty in walking, not elsewhere classified  Unsteadiness on feet  Muscle weakness (generalized)  Rationale for Evaluation and Treatment: Rehabilitation  SUBJECTIVE:                                                                                                                                                                                             SUBJECTIVE  STATEMENT:   Pt reports doing well today. Pt denies any recent falls/stumbles since prior session. Pt denies any updates to medications or medical appointment since prior session. Pt reports good compliance with HEP when time permits.     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 Stroke was four years ago, has had multiple strokes. Had  a little bit of therapy, afterwards in [place]. Moved up to [place] about three years ago. [name] includes DM with diabetic neuropathy, stroke, HTN, AKI, UTI. Uses a walker to walk to the bathroom (two wheel and three wheel walkers).  Has a power chair for out of the house.   PAIN:  Are you having pain? Yes Rt hand, Rt knee 5/10   PRECAUTIONS: Fall  WEIGHT BEARING RESTRICTIONS: No  FALLS: Has patient fallen in last 6 months? No  LIVING ENVIRONMENT: Lives with: lives with their family Lives in: House/apartment Stairs:  ramp outside Has following equipment at home: [name] - 2 wheeled, Environmental consultant - 4 wheeled, Wheelchair (power), shower chair, Ramped entry, and chair lift and hospital bed   PLOF: Independent  PATIENT GOALS: get up and walk by herself, wants to walk with a cane.   OBJECTIVE:     TODAY'S TREATMENT: DATE: 07/12/23  Unless otherwise stated, CGA was provided and gait belt donned in order to ensure pt safety   TA  Ambulation x 80 ft with walker and with AFO donned, consistent GR gait despite cues.  REST STS x 10, cues for proper LE positioning  REST Ambulate x 80 ft with same above mods  REST  Ambulate 80 ft with same above conditions  REST STS x 10 with cues for foot placement, no UE assis tin standing o challenge balance  REST Ambulate x 80 ft to nu step  REST  TE  Nustep intervals with LLE hip stabilizer in place 2 x 3 min intervals at level 2 1 min rest between intervals  Ambulate with RW to power WC then ended session.     PATIENT EDUCATION: Education details: Pt educated throughout session about proper posture and technique with exercises. Improved exercise technique, movement at target joints, use of target muscles after min to mod verbal, visual, tactile cues.    HOME EXERCISE PROGRAM: continue HEP as previously given Access Code: [email or web address] URL: [email or web address] Date: 06/09/2023 Prepared by: Thresa Ross  Exercises - Leg Extension  - 1 x daily - 7 x weekly - 2 sets - 10 reps - 5 hold - Seated March  - 1 x daily - 7 x weekly - 2 sets - 10 reps - 5 hold - Seated Hip Abduction  - 1 x daily - 7 x weekly - 2 sets - 10 reps - 5 hold - Seated Hip Adduction Isometrics with Ball  - 1 x daily - 7 x weekly - 2 sets - 10 reps - 5 hold - Sit to Stand with Armchair  - 1 x daily - 7 x weekly - 2 sets - 5 reps - Side Stepping with Counter Support  - 1 x daily - 7 x weekly - 2 sets - 10 reps  GOALS: Goals reviewed with patient? Yes  SHORT TERM GOALS: Target date: 06/29/2023    Patient will be independent in home exercise program to improve strength/mobility for better functional independence with ADLs. Baseline: 01/26/2023: pt has some difficulty with HEP 06/09/23: completing 3 x per week ( on days not in therapy) Goal status: MET    LONG TERM GOALS: Target date: 08/17/2023    Patient will increase FOTO score to equal to or greater than 53    to demonstrate statistically significant improvement in mobility and quality of life.  Baseline: 3/5: 46%; 01/26/23: 45% ; 03/01/23: 42% 04/28/23: 52.5  8/22 54 9/6:53 Goal status: MET  2.  Patient (> 19 years old) will complete  five times sit to stand test in < 15 seconds indicating an increased LE strength and improved balance. Baseline: 3/5: 38 seconds with SUE support; blocking of LLE; 01/26/23 18 seconds with pulling to stand; 03/01/23: 17 seconds pull to stand  04/28/23. 21.27(average of 2 trials) BUE on RW and CGA from PT  8/21 17.4sec with  BUE support on RW and CGA from PT   06/08/20: 15.94 secB UE support, mostly completes with the R LE, does not get full erect posture each rep  Goal status: IN PROGRESS  3.  Patient will increase 10 meter walk test to >1.71m/s as to improve gait speed for better community ambulation and to reduce fall risk Baseline: 3/5: 1 min 43 seconds with RW and w/c follow; 01/26/2023: 2 min 6 seconds; 03/01/23: 1 min 11 sec, 0.14 m/s with WC follow and 2WW. 04/28/23: 56 sec (average of 2 trials) With 2WW.   8/22: 54.3 sec (average of 3 trials) With 2WW.  9/6:70 sec, 69 sec, 70 sec ( average of 70 sec) Goal status: IN PROGRESS  4.  Patient will increase BLE gross strength to 4/5 as to improve functional strength for independent gait, increased standing tolerance and increased ADL ability. Baseline: 3/5: grossly 2/5 LLE; 4//25/24: RLE grossly 4-/5, LLE grossly 2/5; 03/01/23 RLE grossly 4+/5 and LLE grossly 3+/5, exception 0/5 PF/DF (overall gross Bilat LE strength around 4-/5) 7/26: RLE 4+/5 except 5/5 knee extension. LLE: 4-/5 knee extension, hip adduction 4+/5, all others 3+/5 except ankle DF 2/5 and PF 2-/5  8/22: LLE: hip flexion: 3+,hip adduction 4+, abduction 4, knee extension 4-(delayed activation), knee flexion 4-/5 delayed activation. DF/PF: 1  Goal status: PARTIALLY MET  5.  Patient will increase 2 min walk test by >1ft to indicate improved access to community and safety with gait through house.  Baseline: 42ft with RW, AFO, and CGA from PT 9/6:65 ft with RW  Goal status: ONGOING  ASSESSMENT:  CLINICAL IMPRESSION:  Treatment continued per plan of care- continued to cue for improved  transfer form and safety with STS transitions and with cues pt able to carryover throughout session. She continues to present with good overall progress with gait but still fatigues over a distance of 80 ft requiring rest breaks. Patient states encouraged by overall progress.  Pt will continue to benefit from skilled  physical therapy intervention to address impairments, improve QOL, and attain therapy goals.     OBJECTIVE IMPAIRMENTS: Abnormal gait, cardiopulmonary status limiting activity, decreased activity tolerance, decreased balance, decreased coordination, decreased endurance, decreased knowledge of use of DME, decreased mobility, difficulty walking, decreased ROM, decreased strength, impaired perceived functional ability, impaired flexibility, impaired UE functional use, improper body mechanics, postural dysfunction, obesity, and pain.   ACTIVITY LIMITATIONS: carrying, lifting, bending, sitting, standing, squatting, sleeping, stairs, transfers, bed mobility, bathing, toileting, dressing, reach over head, and caring for others  PARTICIPATION LIMITATIONS: meal prep, cleaning, laundry, interpersonal relationship, driving, shopping, community activity, and church  PERSONAL FACTORS: Age, Behavior pattern, Education, Fitness, Past/current experiences, Profession, Sex, Time since onset of injury/illness/exacerbation, and 3+ comorbidities: DM with diabetic neuropathy, stroke, HTN, AKI, UTI  are also affecting patient's functional outcome.   REHAB POTENTIAL: Fair length of time since CVA  CLINICAL DECISION MAKING: Evolving/moderate complexity  EVALUATION COMPLEXITY: Moderate  PLAN:  PT FREQUENCY: 2x/week  PT DURATION: 12 weeks  PLANNED INTERVENTIONS: Therapeutic exercises, Therapeutic activity, Neuromuscular re-education, Balance training, Gait training, Patient/Family education, Self Care, Joint mobilization, Stair training, Vestibular training, Canalith repositioning, Visual/preceptual remediation/compensation,  Orthotic/Fit training, DME instructions, Cognitive remediation, Electrical stimulation, Wheelchair mobility training, Spinal mobilization, Cryotherapy, Moist heat, Splintting, Taping, Traction, Ultrasound, Manual therapy, and Re-evaluation  PLAN FOR NEXT SESSION:    Advance gait training  intervals/ volume    Norman Herrlich PT  Physical Therapist- [facility]  [facility]   10:36 AM 07/12/23

## 2023-07-14 ENCOUNTER — Ambulatory Visit: Payer: 59 | Admitting: Physical Therapy

## 2023-07-14 DIAGNOSIS — R2681 Unsteadiness on feet: Secondary | ICD-10-CM

## 2023-07-14 DIAGNOSIS — R262 Difficulty in walking, not elsewhere classified: Secondary | ICD-10-CM

## 2023-07-14 DIAGNOSIS — M6281 Muscle weakness (generalized): Secondary | ICD-10-CM

## 2023-07-14 NOTE — Therapy (Signed)
OUTPATIENT PHYSICAL THERAPY TREATMENT     Patient Name: Dana Lucas MRN: [id number] DOB:01-08-1951, 72 y.o., female Today's Date: 07/14/2023   PCP: Bobbye Morton MD REFERRING PROVIDER: Louis Matte MD   END OF SESSION:  PT End of Session - 07/14/23 1107     Visit Number 49    Number of Visits 60    Date for PT Re-Evaluation 08/17/23    Authorization Type UHC Medicare;  Medicaid    Authorization Time Period 03/01/23-05/24/23    Progress Note Due on Visit 50    PT Start Time 1015    PT Stop Time 1059    PT Time Calculation (min) 44 min    Equipment Utilized During Treatment Gait belt    Activity Tolerance Patient tolerated treatment well;Patient limited by fatigue    Behavior During Therapy WFL for tasks assessed/performed                               Past Medical History:  Diagnosis Date   Diabetes mellitus without complication (HCC)    Hypertension    Stroke [facility])    Past Surgical History:  Procedure Laterality Date   ABDOMINAL HYSTERECTOMY     COLONOSCOPY N/A 10/06/2019   Procedure: COLONOSCOPY;  Surgeon: Toledo, Boykin Nearing, MD;  Location: [facility] ENDOSCOPY;  Service: Gastroenterology;  Laterality: N/A;   Patient Active Problem List   Diagnosis Date Noted   Hypertension    Diabetes mellitus without complication (HCC)    Stroke (HCC)    GIB (gastrointestinal bleeding)    AKI (acute kidney injury) (HCC)    UTI (urinary tract infection)     ONSET DATE: 2020  REFERRING DIAG: 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   THERAPY DIAG:  Difficulty in walking, not elsewhere classified  Unsteadiness on feet  Muscle weakness (generalized)  Rationale for Evaluation and Treatment: Rehabilitation  SUBJECTIVE:                                                                                                                                                                                              SUBJECTIVE STATEMENT:   Pt reports doing well today. Pt denies any recent falls/stumbles since prior session. Pt denies any updates to medications or medical appointment since prior session. Pt reports good compliance with HEP when time permits.     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 Stroke was four years ago, has had multiple strokes.  Had a little bit of therapy, afterwards in [place]. Moved up to [place] about three years ago. [name] includes DM with diabetic neuropathy, stroke, HTN, AKI, UTI. Uses a walker to walk to the bathroom (two wheel and three wheel walkers).  Has a power chair for out of the house.   PAIN:  Are you having pain? Yes Rt hand, Rt knee 5/10   PRECAUTIONS: Fall  WEIGHT BEARING RESTRICTIONS: No  FALLS: Has patient fallen in last 6 months? No  LIVING ENVIRONMENT: Lives with: lives with their family Lives in: House/apartment Stairs:  ramp outside Has following equipment at home: [name] - 2 wheeled, Environmental consultant - 4 wheeled, Wheelchair (power), shower chair, Ramped entry, and chair lift and hospital bed   PLOF: Independent  PATIENT GOALS: get up and walk by herself, wants to walk with a cane.   OBJECTIVE:   TODAY'S TREATMENT: DATE: 07/14/23  Unless otherwise stated, CGA was provided and gait belt donned in order to ensure pt safety   TE  Nustep intervals with LLE hip stabilizer in place 6 min level 1 throughout  Self care  Spent a good amount of time donning DonJoy knee stabilizer brace to prevent knee hyperextension and genu recurvatum with ambulation.  Overall success but did take increased time to properly don the brace ensure it was a safe fit.  TA Ambulation x 80 ft with walker and with DonJoy knee brace donned.  Improve genu recurvatum. Patient ambulates to restroom and utilizes some of treatment time using the restroom.- no charge, pt  completes this independently Ambulation x 100 feet with same DonJoy knee brace donned.  Continue to improve due to recurvatum.  Cues for proper foot clearance ambulation   PATIENT EDUCATION: Education details: Pt educated throughout session about proper posture and technique with exercises. Improved exercise technique, movement at target joints, use of target muscles after min to mod verbal, visual, tactile cues.    HOME EXERCISE PROGRAM: continue HEP as previously given Access Code: [email or web address] URL: [email or web address] Date: 06/09/2023 Prepared by: Thresa Ross  Exercises - Leg Extension  - 1 x daily - 7 x weekly - 2 sets - 10 reps - 5 hold - Seated March  - 1 x daily - 7 x weekly - 2 sets - 10 reps - 5 hold - Seated Hip Abduction  - 1 x daily - 7 x weekly - 2 sets - 10 reps - 5 hold - Seated Hip Adduction Isometrics with Ball  - 1 x daily - 7 x weekly - 2 sets - 10 reps - 5 hold - Sit to Stand with Armchair  - 1 x daily - 7 x weekly - 2 sets - 5 reps - Side Stepping with Counter Support  - 1 x daily - 7 x weekly - 2 sets - 10 reps  GOALS: Goals reviewed with patient? Yes  SHORT TERM GOALS: Target date: 06/29/2023    Patient will be independent in home exercise program to improve strength/mobility for better functional independence with ADLs. Baseline: 01/26/2023: pt has some difficulty with HEP 06/09/23: completing 3 x per week ( on days not in therapy) Goal status: MET    LONG TERM GOALS: Target date: 08/17/2023    Patient will increase FOTO score to equal to or greater than 53    to demonstrate statistically significant improvement in mobility and quality of life.  Baseline: 3/5: 46%; 01/26/23: 45% ; 03/01/23: 42% 04/28/23: 52.5  8/22 54 9/6:53 Goal status: MET  2.  Patient (> 12 years old) will complete five times sit to stand test in < 15 seconds indicating an increased LE strength and improved balance. Baseline: 3/5: 38 seconds with SUE support;  blocking of LLE; 01/26/23 18 seconds with pulling to stand; 03/01/23: 17 seconds pull to stand  04/28/23. 21.27(average of 2 trials) BUE on RW and CGA from PT  8/21 17.4sec with BUE support on RW and CGA from PT   06/08/20: 15.94 secB UE support, mostly completes with the R LE, does not get full erect posture each rep  Goal status: IN PROGRESS  3.  Patient will increase 10 meter walk test to >1.21m/s as to improve gait speed for better community ambulation and to reduce fall risk Baseline: 3/5: 1 min 43 seconds with RW and w/c follow; 01/26/2023: 2 min 6 seconds; 03/01/23: 1 min 11 sec, 0.14 m/s with WC follow and 2WW. 04/28/23: 56 sec (average of 2 trials) With 2WW.   8/22: 54.3 sec (average of 3 trials) With 2WW.  9/6:70 sec, 69 sec, 70 sec ( average of 70 sec) Goal status: IN PROGRESS  4.  Patient will increase BLE gross strength to 4/5 as to improve functional strength for independent gait, increased standing tolerance and increased ADL ability. Baseline: 3/5: grossly 2/5 LLE; 4//25/24: RLE grossly 4-/5, LLE grossly 2/5; 03/01/23 RLE grossly 4+/5 and LLE grossly 3+/5, exception 0/5 PF/DF (overall gross Bilat LE strength around 4-/5) 7/26: RLE 4+/5 except 5/5 knee extension. LLE: 4-/5 knee extension, hip adduction 4+/5, all others 3+/5 except ankle DF 2/5 and PF 2-/5  8/22: LLE: hip flexion: 3+,hip adduction 4+, abduction 4, knee extension 4-(delayed activation), knee flexion 4-/5 delayed activation. DF/PF: 1  Goal status: PARTIALLY MET  5.  Patient will increase 2 min walk test by >75ft to indicate improved access to community and safety with gait through house.  Baseline: 63ft with RW, AFO, and CGA from PT 9/6:65 ft with RW  Goal status: ONGOING  ASSESSMENT:  CLINICAL IMPRESSION:   Continued with plan of care from previous sessions.  Physical therapist tries DonJoy knee brace motion prevent genu recurvatum and overall had good success with this.  Physical therapist recommends considering some  kind of Swedish knee brace that would work in combination with her AFO to improve this pattern of gait.  There were no good options outside of anger clinic and professional orthotist referral from a physical therapist saw upon initial research but more researched will be completed to determine if I will potential options have been evaluated.  Patient has already had 1 new orthotic device this year is unlikely to get another covered by insurance. Pt will continue to benefit from skilled physical therapy intervention to address impairments, improve QOL, and attain therapy goals.    OBJECTIVE IMPAIRMENTS: Abnormal gait, cardiopulmonary status limiting activity, decreased activity tolerance, decreased balance, decreased coordination, decreased endurance, decreased knowledge of use of DME, decreased mobility, difficulty walking, decreased ROM, decreased strength, impaired perceived functional ability, impaired flexibility, impaired UE functional use, improper body mechanics, postural dysfunction, obesity, and pain.   ACTIVITY LIMITATIONS: carrying, lifting, bending, sitting, standing, squatting, sleeping, stairs, transfers, bed mobility, bathing, toileting, dressing, reach over head, and caring for others  PARTICIPATION LIMITATIONS: meal prep, cleaning, laundry, interpersonal relationship, driving, shopping, community activity, and church  PERSONAL FACTORS: Age, Behavior pattern, Education, Fitness, Past/current experiences, Profession, Sex, Time since onset of injury/illness/exacerbation, and 3+ comorbidities: DM with diabetic neuropathy, stroke, HTN, AKI, UTI  are also affecting patient's functional outcome.  REHAB POTENTIAL: Fair length of time since CVA  CLINICAL DECISION MAKING: Evolving/moderate complexity  EVALUATION COMPLEXITY: Moderate  PLAN:  PT FREQUENCY: 2x/week  PT DURATION: 12 weeks  PLANNED INTERVENTIONS: Therapeutic exercises, Therapeutic activity, Neuromuscular re-education,  Balance training, Gait training, Patient/Family education, Self Care, Joint mobilization, Stair training, Vestibular training, Canalith repositioning, Visual/preceptual remediation/compensation, Orthotic/Fit training, DME instructions, Cognitive remediation, Electrical stimulation, Wheelchair mobility training, Spinal mobilization, Cryotherapy, Moist heat, Splintting, Taping, Traction, Ultrasound, Manual therapy, and Re-evaluation  PLAN FOR NEXT SESSION:    Advance gait training intervals/ volume  Try donjoy knee brace to prevent GR  Norman Herrlich PT  Physical Therapist- [facility]  [facility]   11:07 AM 07/14/23

## 2023-07-19 ENCOUNTER — Ambulatory Visit: Payer: 59 | Admitting: Physical Therapy

## 2023-07-19 DIAGNOSIS — R2681 Unsteadiness on feet: Secondary | ICD-10-CM

## 2023-07-19 DIAGNOSIS — R262 Difficulty in walking, not elsewhere classified: Secondary | ICD-10-CM

## 2023-07-19 DIAGNOSIS — G8929 Other chronic pain: Secondary | ICD-10-CM

## 2023-07-19 DIAGNOSIS — M6281 Muscle weakness (generalized): Secondary | ICD-10-CM

## 2023-07-19 NOTE — Therapy (Signed)
OUTPATIENT PHYSICAL THERAPY TREATMENT / Physical Therapy Progress Note   Dates of reporting period  06/09/23   to   07/19/23     Patient Name: Dana Lucas MRN: [id number] DOB:[birth year], 72 y.o., female Today's Date: 07/19/2023   PCP: Bobbye Morton MD REFERRING PROVIDER: Louis Matte MD   END OF SESSION:  PT End of Session - 07/19/23 1019     Visit Number 50    Number of Visits 60    Date for PT Re-Evaluation 08/17/23    Authorization Type UHC Medicare; [place] Medicaid    Authorization Time Period 03/01/23-05/24/23    Progress Note Due on Visit 50    PT Start Time 1015    PT Stop Time 1059    PT Time Calculation (min) 44 min    Equipment Utilized During Treatment Gait belt    Activity Tolerance Patient tolerated treatment well;Patient limited by fatigue    Behavior During Therapy WFL for tasks assessed/performed                                Past Medical History:  Diagnosis Date   Diabetes mellitus without complication (HCC)    Hypertension    Stroke [facility])    Past Surgical History:  Procedure Laterality Date   ABDOMINAL HYSTERECTOMY     COLONOSCOPY N/A 10/06/2019   Procedure: COLONOSCOPY;  Surgeon: Toledo, Boykin Nearing, MD;  Location: [facility] ENDOSCOPY;  Service: Gastroenterology;  Laterality: N/A;   Patient Active Problem List   Diagnosis Date Noted   Hypertension    Diabetes mellitus without complication (HCC)    Stroke (HCC)    GIB (gastrointestinal bleeding)    AKI (acute kidney injury) (HCC)    UTI (urinary tract infection)     ONSET DATE: 2020  REFERRING DIAG: 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   THERAPY DIAG:  Difficulty in walking, not elsewhere classified  Unsteadiness on feet  Muscle weakness (generalized)  Chronic pain of right knee  Rationale for Evaluation and Treatment: Rehabilitation  SUBJECTIVE:                                                                                                                                                                                              SUBJECTIVE STATEMENT:    Pt reports she feels she is walking more frequently and better at home. Pt repots her L leg exercises have been harder " because she has do to it by herself" but also says she has not had consistent help with exercises  for some time. Pt reports her transfers have improved in that she feels they are easier.    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 Stroke was four years ago, has had multiple strokes. Had a little bit of therapy, afterwards in [place]. Moved up to [place] about three years ago. [name] includes DM with diabetic neuropathy, stroke, HTN, AKI, UTI. Uses a walker to walk to the bathroom (two wheel and three wheel walkers).  Has a power chair for out of the house.   PAIN:  Are you having pain? Yes Rt hand, Rt knee 5/10   PRECAUTIONS: Fall  WEIGHT BEARING RESTRICTIONS: No  FALLS: Has patient fallen in last 6 months? No  LIVING ENVIRONMENT: Lives with: lives with their family Lives in: House/apartment Stairs:  ramp outside Has following equipment at home: [name] - 2 wheeled, Environmental consultant - 4 wheeled, Wheelchair (power), shower chair, Ramped entry, and chair lift and hospital bed   PLOF: Independent  PATIENT GOALS: get up and walk by herself, wants to walk with a cane.   OBJECTIVE:   TODAY'S TREATMENT: DATE: 07/19/23  Unless otherwise stated, CGA was provided and gait belt donned in order to ensure pt safety Physical therapy treatment session today consisted of completing assessment of goals and administration of testing as demonstrated and documented in flow sheet, treatment, and goals section of this note. Addition treatments may be found below.   Pt ambulates extra 85 ft after with RW    PATIENT EDUCATION: Education details: Pt  educated throughout session about proper posture and technique with exercises. Improved exercise technique, movement at target joints, use of target muscles after min to mod verbal, visual, tactile cues.    HOME EXERCISE PROGRAM: continue HEP as previously given Access Code: [email or web address] URL: [email or web address] Date: 06/09/2023 Prepared by: Thresa Ross  Exercises - Leg Extension  - 1 x daily - 7 x weekly - 2 sets - 10 reps - 5 hold - Seated March  - 1 x daily - 7 x weekly - 2 sets - 10 reps - 5 hold - Seated Hip Abduction  - 1 x daily - 7 x weekly - 2 sets - 10 reps - 5 hold - Seated Hip Adduction Isometrics with Ball  - 1 x daily - 7 x weekly - 2 sets - 10 reps - 5 hold - Sit to Stand with Armchair  - 1 x daily - 7 x weekly - 2 sets - 5 reps - Side Stepping with Counter Support  - 1 x daily - 7 x weekly - 2 sets - 10 reps  GOALS: Goals reviewed with patient? Yes  SHORT TERM GOALS: Target date: 06/29/2023    Patient will be independent in home exercise program to improve strength/mobility for better functional independence with ADLs. Baseline: 01/26/2023: pt has some difficulty with HEP 06/09/23: completing 3 x per week ( on days not in therapy) Goal status: MET    LONG TERM GOALS: Target date: 08/17/2023    Patient will increase FOTO score to equal to or greater than 53    to demonstrate statistically significant improvement in mobility and quality of life.  Baseline: 3/5: 46%; 01/26/23: 45% ; 03/01/23: 42% 04/28/23: 52.5  8/22 54 9/6:53 Goal status: MET  2.  Patient (> 75 years old) will complete five times sit to stand test in < 15 seconds indicating an increased LE strength and improved  balance. Baseline: 3/5: 38 seconds with SUE support; blocking of LLE; 01/26/23 18 seconds with pulling to stand; 03/01/23: 17 seconds pull to stand  04/28/23. 21.27(average of 2 trials) BUE on RW and CGA from PT  8/21 17.4sec with BUE support on RW and CGA from PT   06/08/20:  15.94 secB UE support, mostly completes with the R LE, does not get full erect posture each rep  Goal status: IN PROGRESS  3.  Patient will increase 10 meter walk test to >1.49m/s as to improve gait speed for better community ambulation and to reduce fall risk Baseline: 3/5: 1 min 43 seconds with RW and w/c follow; 01/26/2023: 2 min 6 seconds; 03/01/23: 1 min 11 sec, 0.14 m/s with WC follow and 2WW. 04/28/23: 56 sec (average of 2 trials) With 2WW.   8/22: 54.3 sec (average of 3 trials) With 2WW.  9/6:70 sec, 69 sec, 70 sec ( average of 70 sec) 10/16:44.62 sec Goal status: IN PROGRESS  4.  Patient will increase BLE gross strength to 4/5 as to improve functional strength for independent gait, increased standing tolerance and increased ADL ability. Baseline: 3/5: grossly 2/5 LLE; 4//25/24: RLE grossly 4-/5, LLE grossly 2/5; 03/01/23 RLE grossly 4+/5 and LLE grossly 3+/5, exception 0/5 PF/DF (overall gross Bilat LE strength around 4-/5) 7/26: RLE 4+/5 except 5/5 knee extension. LLE: 4-/5 knee extension, hip adduction 4+/5, all others 3+/5 except ankle DF 2/5 and PF 2-/5  8/22: LLE: hip flexion: 3+,hip adduction 4+, abduction 4, knee extension 4-(delayed activation), knee flexion 4-/5 delayed activation. DF/PF: 1  10/16:RLE 4+/5 globally LLE: 3+ for knee flexion, 4- for hip flexion and knee extension, 4/5 for hip ABD/ ADD,  Goal status: PARTIALLY MET  5.  Patient will increase 2 min walk test by >14ft to indicate improved access to community and safety with gait through house.  Baseline: 59ft with RW, AFO, and CGA from PT 9/6:65 ft with RW  10/16: 85 ft with RW  Goal status: ONGOING/PROGRESSING   ASSESSMENT:  CLINICAL IMPRESSION:   Pt presents to PT for progress note this date. Pt shows progress with gait speed and ambulation distance with and with . Pt reports her transfers are getting easier as well. Pt instructed to practice with STS technique, particularly with trying to focu on  including LLE in transitions as much as she is able. Pt will continue to benefit from skilled physical therapy intervention to address impairments, improve QOL, and attain therapy goals. Patient's condition has the potential to improve in response to therapy. Maximum improvement is yet to be obtained. The anticipated improvement is attainable and reasonable in a generally predictable time.      OBJECTIVE IMPAIRMENTS: Abnormal gait, cardiopulmonary status limiting activity, decreased activity tolerance, decreased balance, decreased coordination, decreased endurance, decreased knowledge of use of DME, decreased mobility, difficulty walking, decreased ROM, decreased strength, impaired perceived functional ability, impaired flexibility, impaired UE functional use, improper body mechanics, postural dysfunction, obesity, and pain.   ACTIVITY LIMITATIONS: carrying, lifting, bending, sitting, standing, squatting, sleeping, stairs, transfers, bed mobility, bathing, toileting, dressing, reach over head, and caring for others  PARTICIPATION LIMITATIONS: meal prep, cleaning, laundry, interpersonal relationship, driving, shopping, community activity, and church  PERSONAL FACTORS: Age, Behavior pattern, Education, Fitness, Past/current experiences, Profession, Sex, Time since onset of injury/illness/exacerbation, and 3+ comorbidities: DM with diabetic neuropathy, stroke, HTN, AKI, UTI  are also affecting patient's functional outcome.   REHAB POTENTIAL: Fair length of time since CVA  CLINICAL DECISION MAKING: Evolving/moderate  complexity  EVALUATION COMPLEXITY: Moderate  PLAN:  PT FREQUENCY: 2x/week  PT DURATION: 12 weeks  PLANNED INTERVENTIONS: Therapeutic exercises, Therapeutic activity, Neuromuscular re-education, Balance training, Gait training, Patient/Family education, Self Care, Joint mobilization, Stair training, Vestibular training, Canalith repositioning, Visual/preceptual  remediation/compensation, Orthotic/Fit training, DME instructions, Cognitive remediation, Electrical stimulation, Wheelchair mobility training, Spinal mobilization, Cryotherapy, Moist heat, Splintting, Taping, Traction, Ultrasound, Manual therapy, and Re-evaluation  PLAN FOR NEXT SESSION:    Advance gait training intervals/ volume  Try donjoy knee brace to prevent GR  Norman Herrlich PT  Physical Therapist- [facility]  [facility]   10:20 AM 07/19/23

## 2023-07-21 ENCOUNTER — Ambulatory Visit: Payer: 59 | Admitting: Physical Therapy

## 2023-07-21 DIAGNOSIS — R262 Difficulty in walking, not elsewhere classified: Secondary | ICD-10-CM

## 2023-07-21 DIAGNOSIS — M6281 Muscle weakness (generalized): Secondary | ICD-10-CM

## 2023-07-21 DIAGNOSIS — R2681 Unsteadiness on feet: Secondary | ICD-10-CM

## 2023-07-21 NOTE — Therapy (Signed)
OUTPATIENT PHYSICAL THERAPY TREATMENT    Patient Name: Dana Lucas MRN: [id number] DOB:Aug 28, 1951, 72 y.o., female Today's Date: 07/21/2023   PCP: Bobbye Morton MD REFERRING PROVIDER: Louis Matte MD   END OF SESSION:  PT End of Session - 07/21/23 1103     Visit Number 51    Number of Visits 60    Date for PT Re-Evaluation 08/17/23    Authorization Type UHC Medicare;  Medicaid    Authorization Time Period 03/01/23-05/24/23    Progress Note Due on Visit 60    PT Start Time 1015    PT Stop Time 1058    PT Time Calculation (min) 43 min    Equipment Utilized During Treatment Gait belt    Activity Tolerance Patient tolerated treatment well;Patient limited by fatigue    Behavior During Therapy WFL for tasks assessed/performed                                Past Medical History:  Diagnosis Date   Diabetes mellitus without complication (HCC)    Hypertension    Stroke [facility])    Past Surgical History:  Procedure Laterality Date   ABDOMINAL HYSTERECTOMY     COLONOSCOPY N/A 10/06/2019   Procedure: COLONOSCOPY;  Surgeon: Toledo, Boykin Nearing, MD;  Location: [facility] ENDOSCOPY;  Service: Gastroenterology;  Laterality: N/A;   Patient Active Problem List   Diagnosis Date Noted   Hypertension    Diabetes mellitus without complication (HCC)    Stroke (HCC)    GIB (gastrointestinal bleeding)    AKI (acute kidney injury) (HCC)    UTI (urinary tract infection)     ONSET DATE: 2020  REFERRING DIAG: 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   THERAPY DIAG:  Difficulty in walking, not elsewhere classified  Unsteadiness on feet  Muscle weakness (generalized)  Rationale for Evaluation and Treatment: Rehabilitation  SUBJECTIVE:                                                                                                                                                                                              SUBJECTIVE STATEMENT:    Patient reports no significant changes since previous session.  Patient reports she has been doing well.   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 Stroke was four years ago, has had multiple strokes. Had a little bit of therapy, afterwards in [place]. Moved up to [place] about three years ago. [name]  includes DM with diabetic neuropathy, stroke, HTN, AKI, UTI. Uses a walker to walk to the bathroom (two wheel and three wheel walkers).  Has a power chair for out of the house.   PAIN:  Are you having pain? Yes Rt hand, Rt knee 5/10   PRECAUTIONS: Fall  WEIGHT BEARING RESTRICTIONS: No  FALLS: Has patient fallen in last 6 months? No  LIVING ENVIRONMENT: Lives with: lives with their family Lives in: House/apartment Stairs:  ramp outside Has following equipment at home: [name] - 2 wheeled, Environmental consultant - 4 wheeled, Wheelchair (power), shower chair, Ramped entry, and chair lift and hospital bed   PLOF: Independent  PATIENT GOALS: get up and walk by herself, wants to walk with a cane.   OBJECTIVE:   TODAY'S TREATMENT: DATE: 07/21/23  TE  Transfer to NuStep via rolling walker and sit to stand and pivot transfer.  Level 3 with bilateral upper extremity and lower extremity movements x 6 minutes.  Following this patient utilizes positioning on NuStep to don DonJoy locking knee brace for prevention of genu recurvatum with gait.  DonJoy knee brace donned throughout remaining interventions  TA Ambulation with rolling walker and DonJoy locking knee brace x 80 feet, brief rest.  Then another ambulation with the same above modifications.  DonJoy knee brace does prevent genu recurvatum throughout.  Brief rest.  Followed by transition to parallel bars In parallel bars patient has left lower extremity in stance phase of gait and moves right lower extremity forward over  half foam roller and back x 35 repetitions with focus on clearing half foam roller each time to improve her step length with the right lower extremity and improve her stance phase of gait with the left lower extremity.  Ambulation with rolling walker focusing o right lower extremity step length on left lower extremity stance phase of gait and then off to DonJoy knee brace at end of session  PATIENT EDUCATION: Education details: Pt educated throughout session about proper posture and technique with exercises. Improved exercise technique, movement at target joints, use of target muscles after min to mod verbal, visual, tactile cues.    HOME EXERCISE PROGRAM: continue HEP as previously given Access Code: [email or web address] URL: [email or web address] Date: 06/09/2023 Prepared by: Thresa Ross  Exercises - Leg Extension  - 1 x daily - 7 x weekly - 2 sets - 10 reps - 5 hold - Seated March  - 1 x daily - 7 x weekly - 2 sets - 10 reps - 5 hold - Seated Hip Abduction  - 1 x daily - 7 x weekly - 2 sets - 10 reps - 5 hold - Seated Hip Adduction Isometrics with Ball  - 1 x daily - 7 x weekly - 2 sets - 10 reps - 5 hold - Sit to Stand with Armchair  - 1 x daily - 7 x weekly - 2 sets - 5 reps - Side Stepping with Counter Support  - 1 x daily - 7 x weekly - 2 sets - 10 reps  GOALS: Goals reviewed with patient? Yes  SHORT TERM GOALS: Target date: 06/29/2023    Patient will be independent in home exercise program to improve strength/mobility for better functional independence with ADLs. Baseline: 01/26/2023: pt has some difficulty with HEP 06/09/23: completing 3 x per week ( on days not in therapy) Goal status: MET    LONG TERM GOALS: Target date: 08/17/2023    Patient will increase FOTO score to equal to or greater than 53  to demonstrate statistically significant improvement in mobility and quality of life.  Baseline: 3/5: 46%; 01/26/23: 45% ; 03/01/23: 42% 04/28/23: 52.5  8/22  54 9/6:53 Goal status: MET  2.  Patient (> 73 years old) will complete five times sit to stand test in < 15 seconds indicating an increased LE strength and improved balance. Baseline: 3/5: 38 seconds with SUE support; blocking of LLE; 01/26/23 18 seconds with pulling to stand; 03/01/23: 17 seconds pull to stand  04/28/23. 21.27(average of 2 trials) BUE on RW and CGA from PT  8/21 17.4sec with BUE support on RW and CGA from PT   06/08/20: 15.94 secB UE support, mostly completes with the R LE, does not get full erect posture each rep  Goal status: IN PROGRESS  3.  Patient will increase 10 meter walk test to >1.5m/s as to improve gait speed for better community ambulation and to reduce fall risk Baseline: 3/5: 1 min 43 seconds with RW and w/c follow; 01/26/2023: 2 min 6 seconds; 03/01/23: 1 min 11 sec, 0.14 m/s with WC follow and 2WW. 04/28/23: 56 sec (average of 2 trials) With 2WW.   8/22: 54.3 sec (average of 3 trials) With 2WW.  9/6:70 sec, 69 sec, 70 sec ( average of 70 sec) 10/16:44.62 sec Goal status: IN PROGRESS  4.  Patient will increase BLE gross strength to 4/5 as to improve functional strength for independent gait, increased standing tolerance and increased ADL ability. Baseline: 3/5: grossly 2/5 LLE; 4//25/24: RLE grossly 4-/5, LLE grossly 2/5; 03/01/23 RLE grossly 4+/5 and LLE grossly 3+/5, exception 0/5 PF/DF (overall gross Bilat LE strength around 4-/5) 7/26: RLE 4+/5 except 5/5 knee extension. LLE: 4-/5 knee extension, hip adduction 4+/5, all others 3+/5 except ankle DF 2/5 and PF 2-/5  8/22: LLE: hip flexion: 3+,hip adduction 4+, abduction 4, knee extension 4-(delayed activation), knee flexion 4-/5 delayed activation. DF/PF: 1  10/16:RLE 4+/5 globally LLE: 3+ for knee flexion, 4- for hip flexion and knee extension, 4/5 for hip ABD/ ADD,  Goal status: PARTIALLY MET  5.  Patient will increase 2 min walk test by >62ft to indicate improved access to community and safety with gait through  house.  Baseline: 57ft with RW, AFO, and CGA from PT 9/6:65 ft with RW  10/16: 85 ft with RW  Goal status: ONGOING/PROGRESSING   ASSESSMENT:  CLINICAL IMPRESSION:   Patient presents with good motivation for completion of physical therapy activities.  Patient continues to progress with ambulation tasks this date as well with DonJoy knee brace preventing genu recurvatum and gait.  Patient encouraged to seek further evaluation from [facility] or seek another means of obtaining a Swedish knee cage to prevent genu recurvatum with gait and improve her gait efficiency and safety. Pt will continue to benefit from skilled physical therapy intervention to address impairments, improve QOL, and attain therapy goals.    OBJECTIVE IMPAIRMENTS: Abnormal gait, cardiopulmonary status limiting activity, decreased activity tolerance, decreased balance, decreased coordination, decreased endurance, decreased knowledge of use of DME, decreased mobility, difficulty walking, decreased ROM, decreased strength, impaired perceived functional ability, impaired flexibility, impaired UE functional use, improper body mechanics, postural dysfunction, obesity, and pain.   ACTIVITY LIMITATIONS: carrying, lifting, bending, sitting, standing, squatting, sleeping, stairs, transfers, bed mobility, bathing, toileting, dressing, reach over head, and caring for others  PARTICIPATION LIMITATIONS: meal prep, cleaning, laundry, interpersonal relationship, driving, shopping, community activity, and church  PERSONAL FACTORS: Age, Behavior pattern, Education, Fitness, Past/current experiences, Profession, Sex, Time since onset of  injury/illness/exacerbation, and 3+ comorbidities: DM with diabetic neuropathy, stroke, HTN, AKI, UTI  are also affecting patient's functional outcome.   REHAB POTENTIAL: Fair length of time since CVA  CLINICAL DECISION MAKING: Evolving/moderate complexity  EVALUATION COMPLEXITY: Moderate  PLAN:  PT  FREQUENCY: 2x/week  PT DURATION: 12 weeks  PLANNED INTERVENTIONS: Therapeutic exercises, Therapeutic activity, Neuromuscular re-education, Balance training, Gait training, Patient/Family education, Self Care, Joint mobilization, Stair training, Vestibular training, Canalith repositioning, Visual/preceptual remediation/compensation, Orthotic/Fit training, DME instructions, Cognitive remediation, Electrical stimulation, Wheelchair mobility training, Spinal mobilization, Cryotherapy, Moist heat, Splintting, Taping, Traction, Ultrasound, Manual therapy, and Re-evaluation  PLAN FOR NEXT SESSION:    Advance gait training intervals/ volume  Donjoy knee brace to prevent GR  Norman Herrlich PT  Physical Therapist- [facility]  [facility]   11:18 AM 07/21/23

## 2023-07-24 ENCOUNTER — Ambulatory Visit: Payer: 59 | Admitting: Physical Therapy

## 2023-07-24 DIAGNOSIS — R262 Difficulty in walking, not elsewhere classified: Secondary | ICD-10-CM | POA: Diagnosis not present

## 2023-07-24 DIAGNOSIS — M6281 Muscle weakness (generalized): Secondary | ICD-10-CM

## 2023-07-24 DIAGNOSIS — R2681 Unsteadiness on feet: Secondary | ICD-10-CM

## 2023-07-24 NOTE — Therapy (Signed)
OUTPATIENT PHYSICAL THERAPY TREATMENT    Patient Name: Dana Lucas MRN: [id number] DOB:[birth year], 72 y.o., female Today's Date: 07/24/2023   PCP: Bobbye Morton MD REFERRING PROVIDER: Louis Matte MD   END OF SESSION:  PT End of Session - 07/24/23 0818     Visit Number 52    Number of Visits 60    Date for PT Re-Evaluation 08/17/23    Authorization Type UHC Medicare; [place] Medicaid    Authorization Time Period 03/01/23-05/24/23    Progress Note Due on Visit 60    PT Start Time 0808    PT Stop Time 0843    PT Time Calculation (min) 35 min    Equipment Utilized During Treatment Gait belt    Activity Tolerance Patient tolerated treatment well;Patient limited by fatigue    Behavior During Therapy WFL for tasks assessed/performed                                Past Medical History:  Diagnosis Date   Diabetes mellitus without complication (HCC)    Hypertension    Stroke [facility])    Past Surgical History:  Procedure Laterality Date   ABDOMINAL HYSTERECTOMY     COLONOSCOPY N/A 10/06/2019   Procedure: COLONOSCOPY;  Surgeon: Toledo, Boykin Nearing, MD;  Location: [facility] ENDOSCOPY;  Service: Gastroenterology;  Laterality: N/A;   Patient Active Problem List   Diagnosis Date Noted   Hypertension    Diabetes mellitus without complication (HCC)    Stroke (HCC)    GIB (gastrointestinal bleeding)    AKI (acute kidney injury) (HCC)    UTI (urinary tract infection)     ONSET DATE: 2020  REFERRING DIAG: 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   THERAPY DIAG:  Difficulty in walking, not elsewhere classified  Unsteadiness on feet  Muscle weakness (generalized)  Rationale for Evaluation and Treatment: Rehabilitation  SUBJECTIVE:                                                                                                                                                                                              SUBJECTIVE STATEMENT:    Patient reports no significant changes since previous session.  Patient reports she has been doing well.   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 Stroke was four years ago, has had multiple strokes. Had a little bit of therapy, afterwards in [place]. Moved up to [place] about three years ago. [name]  includes DM with diabetic neuropathy, stroke, HTN, AKI, UTI. Uses a walker to walk to the bathroom (two wheel and three wheel walkers).  Has a power chair for out of the house.   PAIN:  Are you having pain? Yes Rt hand, Rt knee 5/10   PRECAUTIONS: Fall  WEIGHT BEARING RESTRICTIONS: No  FALLS: Has patient fallen in last 6 months? No  LIVING ENVIRONMENT: Lives with: lives with their family Lives in: House/apartment Stairs:  ramp outside Has following equipment at home: [name] - 2 wheeled, Environmental consultant - 4 wheeled, Wheelchair (power), shower chair, Ramped entry, and chair lift and hospital bed   PLOF: Independent  PATIENT GOALS: get up and walk by herself, wants to walk with a cane.   OBJECTIVE:   TODAY'S TREATMENT: DATE: 07/24/23  TE  Transfer to NuStep via rolling walker and sit to stand and pivot transfer.  Level 3 with bilateral upper extremity and lower extremity movements x 6 minutes.  Following this patient utilizes positioning on NuStep to don DonJoy locking knee brace for prevention of genu recurvatum with gait.  DonJoy knee brace donned throughout remaining interventions  TA Ambulation with rolling walker and DonJoy locking knee brace x 110 feet, brief rest. -6X STS with focus on LLE staying in contact with the floor. -Then another ambulation with the same above modifications and same distance.  DonJoy knee brace does prevent genu recurvatum throughout.  REST In parallel bars patient has left lower extremity in stance phase of gait and moves  right lower extremity forward over half foam roller and back x 15 repetitions with focus on clearing half foam roller each time to improve her step length with the right lower extremity and improve her stance phase of gait with the left lower extremity.  Transfer to [facility] post session using RW, doffed DonJoy knee brace   Pt session was cut a little short today due to pt arriving late to scheduled appointment time   PATIENT EDUCATION:  PATIENT EDUCATION: Education details: Pt educated throughout session about proper posture and technique with exercises. Improved exercise technique, movement at target joints, use of target muscles after min to mod verbal, visual, tactile cues.    HOME EXERCISE PROGRAM: continue HEP as previously given Access Code: [email or web address] URL: [email or web address] Date: 06/09/2023 Prepared by: Thresa Ross  Exercises - Leg Extension  - 1 x daily - 7 x weekly - 2 sets - 10 reps - 5 hold - Seated March  - 1 x daily - 7 x weekly - 2 sets - 10 reps - 5 hold - Seated Hip Abduction  - 1 x daily - 7 x weekly - 2 sets - 10 reps - 5 hold - Seated Hip Adduction Isometrics with Ball  - 1 x daily - 7 x weekly - 2 sets - 10 reps - 5 hold - Sit to Stand with Armchair  - 1 x daily - 7 x weekly - 2 sets - 5 reps - Side Stepping with Counter Support  - 1 x daily - 7 x weekly - 2 sets - 10 reps  GOALS: Goals reviewed with patient? Yes  SHORT TERM GOALS: Target date: 06/29/2023    Patient will be independent in home exercise program to improve strength/mobility for better functional independence with ADLs. Baseline: 01/26/2023: pt has some difficulty with HEP 06/09/23: completing 3 x per week ( on days not in therapy) Goal status: MET    LONG TERM GOALS: Target date: 08/17/2023    Patient will  increase FOTO score to equal to or greater than 53    to demonstrate statistically significant improvement in mobility and quality of life.  Baseline: 3/5: 46%; 01/26/23:  45% ; 03/01/23: 42% 04/28/23: 52.5  8/22 54 9/6:53 Goal status: MET  2.  Patient (> 78 years old) will complete five times sit to stand test in < 15 seconds indicating an increased LE strength and improved balance. Baseline: 3/5: 38 seconds with SUE support; blocking of LLE; 01/26/23 18 seconds with pulling to stand; 03/01/23: 17 seconds pull to stand  04/28/23. 21.27(average of 2 trials) BUE on RW and CGA from PT  8/21 17.4sec with BUE support on RW and CGA from PT   06/08/20: 15.94 secB UE support, mostly completes with the R LE, does not get full erect posture each rep  Goal status: IN PROGRESS  3.  Patient will increase 10 meter walk test to >1.28m/s as to improve gait speed for better community ambulation and to reduce fall risk Baseline: 3/5: 1 min 43 seconds with RW and w/c follow; 01/26/2023: 2 min 6 seconds; 03/01/23: 1 min 11 sec, 0.14 m/s with WC follow and 2WW. 04/28/23: 56 sec (average of 2 trials) With 2WW.   8/22: 54.3 sec (average of 3 trials) With 2WW.  9/6:70 sec, 69 sec, 70 sec ( average of 70 sec) 10/16:44.62 sec Goal status: IN PROGRESS  4.  Patient will increase BLE gross strength to 4/5 as to improve functional strength for independent gait, increased standing tolerance and increased ADL ability. Baseline: 3/5: grossly 2/5 LLE; 4//25/24: RLE grossly 4-/5, LLE grossly 2/5; 03/01/23 RLE grossly 4+/5 and LLE grossly 3+/5, exception 0/5 PF/DF (overall gross Bilat LE strength around 4-/5) 7/26: RLE 4+/5 except 5/5 knee extension. LLE: 4-/5 knee extension, hip adduction 4+/5, all others 3+/5 except ankle DF 2/5 and PF 2-/5  8/22: LLE: hip flexion: 3+,hip adduction 4+, abduction 4, knee extension 4-(delayed activation), knee flexion 4-/5 delayed activation. DF/PF: 1  10/16:RLE 4+/5 globally LLE: 3+ for knee flexion, 4- for hip flexion and knee extension, 4/5 for hip ABD/ ADD,  Goal status: PARTIALLY MET  5.  Patient will increase 2 min walk test by >74ft to indicate improved access to  community and safety with gait through house.  Baseline: 38ft with RW, AFO, and CGA from PT 9/6:65 ft with RW  10/16: 85 ft with RW  Goal status: ONGOING/PROGRESSING   ASSESSMENT:  CLINICAL IMPRESSION:   Patient presents with good motivation for completion of physical therapy activities. Pt session was cut a little short today due to pt arriving late to scheduled appointment time. Patient continues to progress with ambulation tasks this date as well with DonJoy knee brace preventing genu recurvatum and gait.  Patient encouraged to seek further evaluation from [facility] or seek another means of obtaining a Swedish knee cage to prevent genu recurvatum with gait and improve her gait efficiency and safety. Pt will continue to benefit from skilled physical therapy intervention to address impairments, improve QOL, and attain therapy goals.    OBJECTIVE IMPAIRMENTS: Abnormal gait, cardiopulmonary status limiting activity, decreased activity tolerance, decreased balance, decreased coordination, decreased endurance, decreased knowledge of use of DME, decreased mobility, difficulty walking, decreased ROM, decreased strength, impaired perceived functional ability, impaired flexibility, impaired UE functional use, improper body mechanics, postural dysfunction, obesity, and pain.   ACTIVITY LIMITATIONS: carrying, lifting, bending, sitting, standing, squatting, sleeping, stairs, transfers, bed mobility, bathing, toileting, dressing, reach over head, and caring for others  PARTICIPATION  LIMITATIONS: meal prep, cleaning, laundry, interpersonal relationship, driving, shopping, community activity, and church  PERSONAL FACTORS: Age, Behavior pattern, Education, Fitness, Past/current experiences, Profession, Sex, Time since onset of injury/illness/exacerbation, and 3+ comorbidities: DM with diabetic neuropathy, stroke, HTN, AKI, UTI  are also affecting patient's functional outcome.   REHAB POTENTIAL: Fair  length of time since CVA  CLINICAL DECISION MAKING: Evolving/moderate complexity  EVALUATION COMPLEXITY: Moderate  PLAN:  PT FREQUENCY: 2x/week  PT DURATION: 12 weeks  PLANNED INTERVENTIONS: Therapeutic exercises, Therapeutic activity, Neuromuscular re-education, Balance training, Gait training, Patient/Family education, Self Care, Joint mobilization, Stair training, Vestibular training, Canalith repositioning, Visual/preceptual remediation/compensation, Orthotic/Fit training, DME instructions, Cognitive remediation, Electrical stimulation, Wheelchair mobility training, Spinal mobilization, Cryotherapy, Moist heat, Splintting, Taping, Traction, Ultrasound, Manual therapy, and Re-evaluation  PLAN FOR NEXT SESSION:    Advance gait training intervals/ volume  Donjoy knee brace to prevent GR  Norman Herrlich PT  Physical Therapist- [place]  [facility]   9:45 AM 07/24/23

## 2023-07-26 ENCOUNTER — Encounter: Payer: 59 | Admitting: Occupational Therapy

## 2023-07-26 ENCOUNTER — Ambulatory Visit: Payer: 59 | Admitting: Physical Therapy

## 2023-07-26 DIAGNOSIS — R262 Difficulty in walking, not elsewhere classified: Secondary | ICD-10-CM

## 2023-07-26 DIAGNOSIS — R2681 Unsteadiness on feet: Secondary | ICD-10-CM

## 2023-07-26 NOTE — Therapy (Signed)
OUTPATIENT PHYSICAL THERAPY TREATMENT    Patient Name: Dana Lucas MRN: [id number] DOB:1950/12/14, 72 y.o., female Today's Date: 07/26/2023  PCP: Bobbye Morton MD REFERRING PROVIDER: Louis Matte MD   END OF SESSION:  PT End of Session - 07/26/23 0946     Visit Number 53    Number of Visits 60    Date for PT Re-Evaluation 08/17/23    Authorization Type UHC Medicare; [place] Medicaid    Authorization Time Period 03/01/23-05/24/23    Progress Note Due on Visit 60    PT Start Time 0938    PT Stop Time 1013    PT Time Calculation (min) 35 min    Equipment Utilized During Treatment Gait belt    Activity Tolerance Patient tolerated treatment well;Patient limited by fatigue    Behavior During Therapy WFL for tasks assessed/performed                                Past Medical History:  Diagnosis Date   Diabetes mellitus without complication (HCC)    Hypertension    Stroke [facility])    Past Surgical History:  Procedure Laterality Date   ABDOMINAL HYSTERECTOMY     COLONOSCOPY N/A 10/06/2019   Procedure: COLONOSCOPY;  Surgeon: Toledo, Boykin Nearing, MD;  Location: [facility] ENDOSCOPY;  Service: Gastroenterology;  Laterality: N/A;   Patient Active Problem List   Diagnosis Date Noted   Hypertension    Diabetes mellitus without complication (HCC)    Stroke (HCC)    GIB (gastrointestinal bleeding)    AKI (acute kidney injury) (HCC)    UTI (urinary tract infection)     ONSET DATE: 2020  REFERRING DIAG: 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   THERAPY DIAG:  Difficulty in walking, not elsewhere classified  Unsteadiness on feet  Rationale for Evaluation and Treatment: Rehabilitation  SUBJECTIVE:                                                                                                                                                                                             SUBJECTIVE STATEMENT:    Patient reports  no significant changes since previous session.  Patient reports she has been doing well.She reports she would like a referral to a nutritionist for improving her stroke risk and overall diet. She was instructed her PCP would have to write this but we will be of assistance as we are able.    Pt accompanied by: self  PERTINENT HISTORY:   Patient presents for Hemiplegia and hemiparesis following CVA  affecting L non dominant side. Stroke was four years ago, has had multiple strokes. Had a little bit of therapy, afterwards in [place]. Moved up to [place] about three years ago. [name] includes DM with diabetic neuropathy, stroke, HTN, AKI, UTI. Uses a walker to walk to the bathroom (two wheel and three wheel walkers).  Has a power chair for out of the house.   PAIN:  Are you having pain? Yes Rt hand, Rt knee 5/10   PRECAUTIONS: Fall  WEIGHT BEARING RESTRICTIONS: No  FALLS: Has patient fallen in last 6 months? No  LIVING ENVIRONMENT: Lives with: lives with their family Lives in: House/apartment Stairs:  ramp outside Has following equipment at home: [name] - 2 wheeled, Environmental consultant - 4 wheeled, Wheelchair (power), shower chair, Ramped entry, and chair lift and hospital bed   PLOF: Independent  PATIENT GOALS: get up and walk by herself, wants to walk with a cane.   OBJECTIVE:   TODAY'S TREATMENT: DATE: 07/26/23  TE  Transfer to NuStep via rolling walker and sit to stand and pivot transfer.  Level 3 with bilateral upper extremity and lower extremity movements x 6 minutes.  Following this patient utilizes positioning on NuStep to don DonJoy locking knee brace for prevention of genu recurvatum with gait.  DonJoy knee brace donned throughout remaining interventions  TA Ambulation with rolling walker and DonJoy locking knee brace x 90 feet, brief rest. -6X STS with focus on LLE staying in contact with the floor. -Then another ambulation with the same above modifications and same distance.   DonJoy knee brace does prevent genu recurvatum throughout. REST Ambulation in figure 8 pattern around 2 obstacles   Pt session was cut a little short today due to pt arriving late to scheduled appointment time   PATIENT EDUCATION: Education details: Pt educated throughout session about proper posture and technique with exercises. Improved exercise technique, movement at target joints, use of target muscles after min to mod verbal, visual, tactile cues.    HOME EXERCISE PROGRAM: continue HEP as previously given Access Code: [email or web address] URL: [email or web address] Date: 06/09/2023 Prepared by: Thresa Ross  Exercises - Leg Extension  - 1 x daily - 7 x weekly - 2 sets - 10 reps - 5 hold - Seated March  - 1 x daily - 7 x weekly - 2 sets - 10 reps - 5 hold - Seated Hip Abduction  - 1 x daily - 7 x weekly - 2 sets - 10 reps - 5 hold - Seated Hip Adduction Isometrics with Ball  - 1 x daily - 7 x weekly - 2 sets - 10 reps - 5 hold - Sit to Stand with Armchair  - 1 x daily - 7 x weekly - 2 sets - 5 reps - Side Stepping with Counter Support  - 1 x daily - 7 x weekly - 2 sets - 10 reps  GOALS: Goals reviewed with patient? Yes  SHORT TERM GOALS: Target date: 06/29/2023    Patient will be independent in home exercise program to improve strength/mobility for better functional independence with ADLs. Baseline: 01/26/2023: pt has some difficulty with HEP 06/09/23: completing 3 x per week ( on days not in therapy) Goal status: MET    LONG TERM GOALS: Target date: 08/17/2023    Patient will increase FOTO score to equal to or greater than 53    to demonstrate statistically significant improvement in mobility and quality of life.  Baseline: 3/5: 46%; 01/26/23: 45% ; 03/01/23: 42% 04/28/23:  52.5  8/22 54 9/6:53 Goal status: MET  2.  Patient (> 29 years old) will complete five times sit to stand test in < 15 seconds indicating an increased LE strength and improved  balance. Baseline: 3/5: 38 seconds with SUE support; blocking of LLE; 01/26/23 18 seconds with pulling to stand; 03/01/23: 17 seconds pull to stand  04/28/23. 21.27(average of 2 trials) BUE on RW and CGA from PT  8/21 17.4sec with BUE support on RW and CGA from PT   06/08/20: 15.94 secB UE support, mostly completes with the R LE, does not get full erect posture each rep  Goal status: IN PROGRESS  3.  Patient will increase 10 meter walk test to >1.52m/s as to improve gait speed for better community ambulation and to reduce fall risk Baseline: 3/5: 1 min 43 seconds with RW and w/c follow; 01/26/2023: 2 min 6 seconds; 03/01/23: 1 min 11 sec, 0.14 m/s with WC follow and 2WW. 04/28/23: 56 sec (average of 2 trials) With 2WW.   8/22: 54.3 sec (average of 3 trials) With 2WW.  9/6:70 sec, 69 sec, 70 sec ( average of 70 sec) 10/16:44.62 sec Goal status: IN PROGRESS  4.  Patient will increase BLE gross strength to 4/5 as to improve functional strength for independent gait, increased standing tolerance and increased ADL ability. Baseline: 3/5: grossly 2/5 LLE; 4//25/24: RLE grossly 4-/5, LLE grossly 2/5; 03/01/23 RLE grossly 4+/5 and LLE grossly 3+/5, exception 0/5 PF/DF (overall gross Bilat LE strength around 4-/5) 7/26: RLE 4+/5 except 5/5 knee extension. LLE: 4-/5 knee extension, hip adduction 4+/5, all others 3+/5 except ankle DF 2/5 and PF 2-/5  8/22: LLE: hip flexion: 3+,hip adduction 4+, abduction 4, knee extension 4-(delayed activation), knee flexion 4-/5 delayed activation. DF/PF: 1  10/16:RLE 4+/5 globally LLE: 3+ for knee flexion, 4- for hip flexion and knee extension, 4/5 for hip ABD/ ADD,  Goal status: PARTIALLY MET  5.  Patient will increase 2 min walk test by >66ft to indicate improved access to community and safety with gait through house.  Baseline: 29ft with RW, AFO, and CGA from PT 9/6:65 ft with RW  10/16: 85 ft with RW  Goal status: ONGOING/PROGRESSING   ASSESSMENT:  CLINICAL IMPRESSION:    Patient presents with good motivation for completion of physical therapy activities. Pt session was cut a little short today due to pt arriving late to scheduled appointment time. Patient continues to progress with ambulation tasks this date as well with DonJoy knee brace preventing genu recurvatum and gait.  Patient encouraged to seek further evaluation from [facility] or seek another means of obtaining a Swedish knee cage to prevent genu recurvatum with gait and improve her gait efficiency and safety. Pt will continue to benefit from skilled physical therapy intervention to address impairments, improve QOL, and attain therapy goals.    OBJECTIVE IMPAIRMENTS: Abnormal gait, cardiopulmonary status limiting activity, decreased activity tolerance, decreased balance, decreased coordination, decreased endurance, decreased knowledge of use of DME, decreased mobility, difficulty walking, decreased ROM, decreased strength, impaired perceived functional ability, impaired flexibility, impaired UE functional use, improper body mechanics, postural dysfunction, obesity, and pain.   ACTIVITY LIMITATIONS: carrying, lifting, bending, sitting, standing, squatting, sleeping, stairs, transfers, bed mobility, bathing, toileting, dressing, reach over head, and caring for others  PARTICIPATION LIMITATIONS: meal prep, cleaning, laundry, interpersonal relationship, driving, shopping, community activity, and church  PERSONAL FACTORS: Age, Behavior pattern, Education, Fitness, Past/current experiences, Profession, Sex, Time since onset of injury/illness/exacerbation, and 3+ comorbidities: DM  with diabetic neuropathy, stroke, HTN, AKI, UTI  are also affecting patient's functional outcome.   REHAB POTENTIAL: Fair length of time since CVA  CLINICAL DECISION MAKING: Evolving/moderate complexity  EVALUATION COMPLEXITY: Moderate  PLAN:  PT FREQUENCY: 2x/week  PT DURATION: 12 weeks  PLANNED INTERVENTIONS: Therapeutic  exercises, Therapeutic activity, Neuromuscular re-education, Balance training, Gait training, Patient/Family education, Self Care, Joint mobilization, Stair training, Vestibular training, Canalith repositioning, Visual/preceptual remediation/compensation, Orthotic/Fit training, DME instructions, Cognitive remediation, Electrical stimulation, Wheelchair mobility training, Spinal mobilization, Cryotherapy, Moist heat, Splintting, Taping, Traction, Ultrasound, Manual therapy, and Re-evaluation  PLAN FOR NEXT SESSION:  Advance gait training intervals/ volume  Donjoy knee brace to prevent GR  Norman Herrlich PT  Physical Therapist- [facility]  [facility]   9:47 AM 07/26/23

## 2023-07-28 ENCOUNTER — Ambulatory Visit: Payer: 59 | Admitting: Physical Therapy

## 2023-08-02 ENCOUNTER — Ambulatory Visit: Payer: 59 | Admitting: Physical Therapy

## 2023-08-02 ENCOUNTER — Encounter: Payer: 59 | Admitting: Occupational Therapy

## 2023-08-02 DIAGNOSIS — R2681 Unsteadiness on feet: Secondary | ICD-10-CM

## 2023-08-02 DIAGNOSIS — R262 Difficulty in walking, not elsewhere classified: Secondary | ICD-10-CM

## 2023-08-02 DIAGNOSIS — M6281 Muscle weakness (generalized): Secondary | ICD-10-CM

## 2023-08-02 NOTE — Therapy (Signed)
OUTPATIENT PHYSICAL THERAPY TREATMENT    Patient Name: Dana Lucas MRN: [id number] DOB:Jan 20, 1951, 72 y.o., female Today's Date: 08/02/2023  PCP: Bobbye Morton MD REFERRING PROVIDER: Louis Matte MD   END OF SESSION:  PT End of Session - 08/02/23 0942     Visit Number 54    Number of Visits 60    Date for PT Re-Evaluation 08/17/23    Authorization Type UHC Medicare; [place] Medicaid    Authorization Time Period 03/01/23-05/24/23    Progress Note Due on Visit 60    PT Start Time 0931    PT Stop Time 1012    PT Time Calculation (min) 41 min    Equipment Utilized During Treatment Gait belt    Activity Tolerance Patient tolerated treatment well;Patient limited by fatigue    Behavior During Therapy WFL for tasks assessed/performed                                Past Medical History:  Diagnosis Date   Diabetes mellitus without complication (HCC)    Hypertension    Stroke [facility])    Past Surgical History:  Procedure Laterality Date   ABDOMINAL HYSTERECTOMY     COLONOSCOPY N/A 10/06/2019   Procedure: COLONOSCOPY;  Surgeon: Toledo, Boykin Nearing, MD;  Location: [facility] ENDOSCOPY;  Service: Gastroenterology;  Laterality: N/A;   Patient Active Problem List   Diagnosis Date Noted   Hypertension    Diabetes mellitus without complication (HCC)    Stroke (HCC)    GIB (gastrointestinal bleeding)    AKI (acute kidney injury) (HCC)    UTI (urinary tract infection)     ONSET DATE: 2020  REFERRING DIAG: 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   THERAPY DIAG:  No diagnosis found.  Rationale for Evaluation and Treatment: Rehabilitation  SUBJECTIVE:                                                                                                                                                                                             SUBJECTIVE STATEMENT:    Patient reports no significant changes since previous session.   Patient reports she has been doing well. She reports she got a referral for a nutritionalist after finding info about it from the [facility] stroke support group.    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 Stroke was four years ago, has had multiple strokes. Had a little bit of therapy, afterwards in [place]. Moved  up to [place] about three years ago. [name] includes DM with diabetic neuropathy, stroke, HTN, AKI, UTI. Uses a walker to walk to the bathroom (two wheel and three wheel walkers).  Has a power chair for out of the house.   PAIN:  Are you having pain? Yes Rt hand, Rt knee 5/10   PRECAUTIONS: Fall  WEIGHT BEARING RESTRICTIONS: No  FALLS: Has patient fallen in last 6 months? No  LIVING ENVIRONMENT: Lives with: lives with their family Lives in: House/apartment Stairs:  ramp outside Has following equipment at home: [name] - 2 wheeled, Environmental consultant - 4 wheeled, Wheelchair (power), shower chair, Ramped entry, and chair lift and hospital bed   PLOF: Independent  PATIENT GOALS: get up and walk by herself, wants to walk with a cane.   OBJECTIVE:   TODAY'S TREATMENT: DATE: 08/02/23  TE  Transfer to NuStep via rolling walker and sit to stand and pivot transfer.  Level 3 with bilateral upper extremity and lower extremity movements x 6 minutes.  Following this patient utilizes positioning on NuStep to don DonJoy locking knee brace for prevention of genu recurvatum with gait.  DonJoy knee brace donned throughout remaining interventions  TA Ambulation with rolling walker and DonJoy locking knee brace x 90 feet, brief rest. -8X STS with focus on LLE staying in contact with the floor. -Then another ambulation with the same above modifications and same distance.  DonJoy knee brace does prevent genu recurvatum throughout. Focus on controlling Tke by preventing quick knee extension, increased  difficulty with turning with this  REST  Donned 4# AW to the LLE  Short intervals with cues for challenging speed of ambulation x 20 ft x 3 rounds - each round of STS pt challenged to use good form with LLE weight bearing focus and positional focus     PATIENT EDUCATION: Education details: Pt educated throughout session about proper posture and technique with exercises. Improved exercise technique, movement at target joints, use of target muscles after min to mod verbal, visual, tactile cues.    HOME EXERCISE PROGRAM: continue HEP as previously given Access Code: [email or web address] URL: [email or web address] Date: 06/09/2023 Prepared by: Thresa Ross  Exercises - Leg Extension  - 1 x daily - 7 x weekly - 2 sets - 10 reps - 5 hold - Seated March  - 1 x daily - 7 x weekly - 2 sets - 10 reps - 5 hold - Seated Hip Abduction  - 1 x daily - 7 x weekly - 2 sets - 10 reps - 5 hold - Seated Hip Adduction Isometrics with Ball  - 1 x daily - 7 x weekly - 2 sets - 10 reps - 5 hold - Sit to Stand with Armchair  - 1 x daily - 7 x weekly - 2 sets - 5 reps - Side Stepping with Counter Support  - 1 x daily - 7 x weekly - 2 sets - 10 reps  GOALS: Goals reviewed with patient? Yes  SHORT TERM GOALS: Target date: 06/29/2023    Patient will be independent in home exercise program to improve strength/mobility for better functional independence with ADLs. Baseline: 01/26/2023: pt has some difficulty with HEP 06/09/23: completing 3 x per week ( on days not in therapy) Goal status: MET    LONG TERM GOALS: Target date: 08/17/2023    Patient will increase FOTO score to equal to or greater than 53    to demonstrate statistically significant improvement in mobility and quality of life.  Baseline: 3/5: 46%; 01/26/23: 45% ; 03/01/23: 42% 04/28/23: 52.5  8/22 54 9/6:53 Goal status: MET  2.  Patient (> 50 years old) will complete five times sit to stand test in < 15 seconds indicating an increased  LE strength and improved balance. Baseline: 3/5: 38 seconds with SUE support; blocking of LLE; 01/26/23 18 seconds with pulling to stand; 03/01/23: 17 seconds pull to stand  04/28/23. 21.27(average of 2 trials) BUE on RW and CGA from PT  8/21 17.4sec with BUE support on RW and CGA from PT   06/08/20: 15.94 secB UE support, mostly completes with the R LE, does not get full erect posture each rep  Goal status: IN PROGRESS  3.  Patient will increase 10 meter walk test to >1.53m/s as to improve gait speed for better community ambulation and to reduce fall risk Baseline: 3/5: 1 min 43 seconds with RW and w/c follow; 01/26/2023: 2 min 6 seconds; 03/01/23: 1 min 11 sec, 0.14 m/s with WC follow and 2WW. 04/28/23: 56 sec (average of 2 trials) With 2WW.   8/22: 54.3 sec (average of 3 trials) With 2WW.  9/6:70 sec, 69 sec, 70 sec ( average of 70 sec) 10/16:44.62 sec Goal status: IN PROGRESS  4.  Patient will increase BLE gross strength to 4/5 as to improve functional strength for independent gait, increased standing tolerance and increased ADL ability. Baseline: 3/5: grossly 2/5 LLE; 4//25/24: RLE grossly 4-/5, LLE grossly 2/5; 03/01/23 RLE grossly 4+/5 and LLE grossly 3+/5, exception 0/5 PF/DF (overall gross Bilat LE strength around 4-/5) 7/26: RLE 4+/5 except 5/5 knee extension. LLE: 4-/5 knee extension, hip adduction 4+/5, all others 3+/5 except ankle DF 2/5 and PF 2-/5  8/22: LLE: hip flexion: 3+,hip adduction 4+, abduction 4, knee extension 4-(delayed activation), knee flexion 4-/5 delayed activation. DF/PF: 1  10/16:RLE 4+/5 globally LLE: 3+ for knee flexion, 4- for hip flexion and knee extension, 4/5 for hip ABD/ ADD,  Goal status: PARTIALLY MET  5.  Patient will increase 2 min walk test by >68ft to indicate improved access to community and safety with gait through house.  Baseline: 27ft with RW, AFO, and CGA from PT 9/6:65 ft with RW  10/16: 85 ft with RW  Goal status: ONGOING/PROGRESSING    ASSESSMENT:  CLINICAL IMPRESSION:   Patient presents with good motivation for completion of physical therapy activities. Patient continues to progress with ambulation tasks this date as well with DonJoy knee brace preventing genu recurvatum and gait. Pt challenged with shorter intervals for increased strength and improved gait speed. Pt still progressing with consistency of transfers but is improving with practice.  Patient encouraged to seek further evaluation from [facility] or seek another means of obtaining a Swedish knee cage to prevent genu recurvatum with gait and improve her gait efficiency and safety. Pt will continue to benefit from skilled physical therapy intervention to address impairments, improve QOL, and attain therapy goals.    OBJECTIVE IMPAIRMENTS: Abnormal gait, cardiopulmonary status limiting activity, decreased activity tolerance, decreased balance, decreased coordination, decreased endurance, decreased knowledge of use of DME, decreased mobility, difficulty walking, decreased ROM, decreased strength, impaired perceived functional ability, impaired flexibility, impaired UE functional use, improper body mechanics, postural dysfunction, obesity, and pain.   ACTIVITY LIMITATIONS: carrying, lifting, bending, sitting, standing, squatting, sleeping, stairs, transfers, bed mobility, bathing, toileting, dressing, reach over head, and caring for others  PARTICIPATION LIMITATIONS: meal prep, cleaning, laundry, interpersonal relationship, driving, shopping, community activity, and church  PERSONAL FACTORS: Age, Behavior  pattern, Education, Fitness, Past/current experiences, Profession, Sex, Time since onset of injury/illness/exacerbation, and 3+ comorbidities: DM with diabetic neuropathy, stroke, HTN, AKI, UTI  are also affecting patient's functional outcome.   REHAB POTENTIAL: Fair length of time since CVA  CLINICAL DECISION MAKING: Evolving/moderate complexity  EVALUATION  COMPLEXITY: Moderate  PLAN:  PT FREQUENCY: 2x/week  PT DURATION: 12 weeks  PLANNED INTERVENTIONS: Therapeutic exercises, Therapeutic activity, Neuromuscular re-education, Balance training, Gait training, Patient/Family education, Self Care, Joint mobilization, Stair training, Vestibular training, Canalith repositioning, Visual/preceptual remediation/compensation, Orthotic/Fit training, DME instructions, Cognitive remediation, Electrical stimulation, Wheelchair mobility training, Spinal mobilization, Cryotherapy, Moist heat, Splintting, Taping, Traction, Ultrasound, Manual therapy, and Re-evaluation  PLAN FOR NEXT SESSION:  Advance gait training intervals/ volume  Donjoy knee brace to prevent GR Transfer training for LLE weightbearing, positioning and control.   Norman Herrlich PT  Physical Therapist- [address] [facility]   10:05 AM 08/02/23

## 2023-08-04 ENCOUNTER — Ambulatory Visit: Payer: 59 | Attending: Internal Medicine | Admitting: Physical Therapy

## 2023-08-04 DIAGNOSIS — M6281 Muscle weakness (generalized): Secondary | ICD-10-CM | POA: Insufficient documentation

## 2023-08-04 DIAGNOSIS — R2681 Unsteadiness on feet: Secondary | ICD-10-CM | POA: Diagnosis present

## 2023-08-04 DIAGNOSIS — R278 Other lack of coordination: Secondary | ICD-10-CM | POA: Insufficient documentation

## 2023-08-04 DIAGNOSIS — R262 Difficulty in walking, not elsewhere classified: Secondary | ICD-10-CM | POA: Insufficient documentation

## 2023-08-04 DIAGNOSIS — M25561 Pain in right knee: Secondary | ICD-10-CM | POA: Diagnosis present

## 2023-08-04 DIAGNOSIS — G8929 Other chronic pain: Secondary | ICD-10-CM | POA: Insufficient documentation

## 2023-08-04 NOTE — Therapy (Signed)
OUTPATIENT PHYSICAL THERAPY TREATMENT    Patient Name: Dana Lucas MRN: [id number] DOB:September 05, 1951, 72 y.o., female Today's Date: 08/04/2023  PCP: Bobbye Morton MD REFERRING PROVIDER: Louis Matte MD   END OF SESSION:  PT End of Session - 08/04/23 1041     Visit Number 55    Number of Visits 60    Date for PT Re-Evaluation 08/17/23    Authorization Type UHC Medicare; [place] Medicaid    Authorization Time Period 03/01/23-05/24/23    Progress Note Due on Visit 60    PT Start Time 1016    PT Stop Time 1059    PT Time Calculation (min) 43 min    Equipment Utilized During Treatment Gait belt    Activity Tolerance Patient tolerated treatment well;Patient limited by fatigue    Behavior During Therapy WFL for tasks assessed/performed                                Past Medical History:  Diagnosis Date   Diabetes mellitus without complication (HCC)    Hypertension    Stroke [facility])    Past Surgical History:  Procedure Laterality Date   ABDOMINAL HYSTERECTOMY     COLONOSCOPY N/A 10/06/2019   Procedure: COLONOSCOPY;  Surgeon: Toledo, Boykin Nearing, MD;  Location: [facility] ENDOSCOPY;  Service: Gastroenterology;  Laterality: N/A;   Patient Active Problem List   Diagnosis Date Noted   Hypertension    Diabetes mellitus without complication (HCC)    Stroke (HCC)    GIB (gastrointestinal bleeding)    AKI (acute kidney injury) (HCC)    UTI (urinary tract infection)     ONSET DATE: 2020  REFERRING DIAG: 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   THERAPY DIAG:  Difficulty in walking, not elsewhere classified  Unsteadiness on feet  Muscle weakness (generalized)  Rationale for Evaluation and Treatment: Rehabilitation  SUBJECTIVE:                                                                                                                                                                                             SUBJECTIVE  STATEMENT:    Pt reports doing well today. Pt denies any recent falls/stumbles since prior session. Pt denies any updates to medications or medical appointment since prior session. Pt reports good compliance with HEP when time permits.     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 Stroke was four years ago, has had multiple strokes.  Had a little bit of therapy, afterwards in [place]. Moved up to [place] about three years ago. [name] includes DM with diabetic neuropathy, stroke, HTN, AKI, UTI. Uses a walker to walk to the bathroom (two wheel and three wheel walkers).  Has a power chair for out of the house.   PAIN:  Are you having pain? Yes Rt hand, Rt knee 5/10   PRECAUTIONS: Fall  WEIGHT BEARING RESTRICTIONS: No  FALLS: Has patient fallen in last 6 months? No  LIVING ENVIRONMENT: Lives with: lives with their family Lives in: House/apartment Stairs:  ramp outside Has following equipment at home: [name] - 2 wheeled, Environmental consultant - 4 wheeled, Wheelchair (power), shower chair, Ramped entry, and chair lift and hospital bed   PLOF: Independent  PATIENT GOALS: get up and walk by herself, wants to walk with a cane.   OBJECTIVE:   TODAY'S TREATMENT: DATE: 08/04/23  TE  Transfer to NuStep via rolling walker and sit to stand and pivot transfer.  Level 3 with bilateral upper extremity and lower extremity movements x 6 minutes.  Following this patient utilizes positioning on NuStep to don DonJoy locking knee brace for prevention of genu recurvatum with gait.  DonJoy knee brace donned throughout remaining interventions  TA Ambulation with rolling walker and DonJoy locking knee brace x 90 feet, brief rest. -2x5 STS with focus on LLE staying in contact with the floor. No walker for support but on standby in case of LOB, improved ability when not holding walker with stand to sit transition and  keeping weight to the L side  -Then another ambulation with the same above modifications and same distance.  DonJoy knee brace does prevent genu recurvatum throughout. Focus on controlling Tke by preventing quick knee extension, increased difficulty with turning with this  REST  Donned 4# AW to the LLE  TUG distance and turn around with AW ( 27ft) TUG with 4# AW performed official test distance 62 sec  TUG with 4# performed again in 61 sec  Transfer to WC then doffed all equipment    PATIENT EDUCATION: Education details: Pt educated throughout session about proper posture and technique with exercises. Improved exercise technique, movement at target joints, use of target muscles after min to mod verbal, visual, tactile cues.    HOME EXERCISE PROGRAM: continue HEP as previously given Access Code: [email or web address] URL: [email or web address] Date: 06/09/2023 Prepared by: Thresa Ross  Exercises - Leg Extension  - 1 x daily - 7 x weekly - 2 sets - 10 reps - 5 hold - Seated March  - 1 x daily - 7 x weekly - 2 sets - 10 reps - 5 hold - Seated Hip Abduction  - 1 x daily - 7 x weekly - 2 sets - 10 reps - 5 hold - Seated Hip Adduction Isometrics with Ball  - 1 x daily - 7 x weekly - 2 sets - 10 reps - 5 hold - Sit to Stand with Armchair  - 1 x daily - 7 x weekly - 2 sets - 5 reps - Side Stepping with Counter Support  - 1 x daily - 7 x weekly - 2 sets - 10 reps  GOALS: Goals reviewed with patient? Yes  SHORT TERM GOALS: Target date: 06/29/2023    Patient will be independent in home exercise program to improve strength/mobility for better functional independence with ADLs. Baseline: 01/26/2023: pt has some difficulty with HEP 06/09/23: completing 3 x per week ( on days not in therapy) Goal  status: MET    LONG TERM GOALS: Target date: 08/17/2023    Patient will increase FOTO score to equal to or greater than 53    to demonstrate statistically significant improvement in  mobility and quality of life.  Baseline: 3/5: 46%; 01/26/23: 45% ; 03/01/23: 42% 04/28/23: 52.5  8/22 54 9/6:53 Goal status: MET  2.  Patient (> 82 years old) will complete five times sit to stand test in < 15 seconds indicating an increased LE strength and improved balance. Baseline: 3/5: 38 seconds with SUE support; blocking of LLE; 01/26/23 18 seconds with pulling to stand; 03/01/23: 17 seconds pull to stand  04/28/23. 21.27(average of 2 trials) BUE on RW and CGA from PT  8/21 17.4sec with BUE support on RW and CGA from PT   06/08/20: 15.94 secB UE support, mostly completes with the R LE, does not get full erect posture each rep  Goal status: IN PROGRESS  3.  Patient will increase 10 meter walk test to >1.86m/s as to improve gait speed for better community ambulation and to reduce fall risk Baseline: 3/5: 1 min 43 seconds with RW and w/c follow; 01/26/2023: 2 min 6 seconds; 03/01/23: 1 min 11 sec, 0.14 m/s with WC follow and 2WW. 04/28/23: 56 sec (average of 2 trials) With 2WW.   8/22: 54.3 sec (average of 3 trials) With 2WW.  9/6:70 sec, 69 sec, 70 sec ( average of 70 sec) 10/16:44.62 sec Goal status: IN PROGRESS  4.  Patient will increase BLE gross strength to 4/5 as to improve functional strength for independent gait, increased standing tolerance and increased ADL ability. Baseline: 3/5: grossly 2/5 LLE; 4//25/24: RLE grossly 4-/5, LLE grossly 2/5; 03/01/23 RLE grossly 4+/5 and LLE grossly 3+/5, exception 0/5 PF/DF (overall gross Bilat LE strength around 4-/5) 7/26: RLE 4+/5 except 5/5 knee extension. LLE: 4-/5 knee extension, hip adduction 4+/5, all others 3+/5 except ankle DF 2/5 and PF 2-/5  8/22: LLE: hip flexion: 3+,hip adduction 4+, abduction 4, knee extension 4-(delayed activation), knee flexion 4-/5 delayed activation. DF/PF: 1  10/16:RLE 4+/5 globally LLE: 3+ for knee flexion, 4- for hip flexion and knee extension, 4/5 for hip ABD/ ADD,  Goal status: PARTIALLY MET  5.  Patient will  increase 2 min walk test by >69ft to indicate improved access to community and safety with gait through house.  Baseline: 12ft with RW, AFO, and CGA from PT 9/6:65 ft with RW  10/16: 85 ft with RW  Goal status: ONGOING/PROGRESSING   ASSESSMENT:  CLINICAL IMPRESSION:   Patient presents with good motivation for completion of physical therapy activities. Patient continues to progress with ambulation tasks this date as well with DonJoy knee brace preventing genu recurvatum with  gait. Pt challenged with shorter intervals for increased strength and improved gait speed with AW. Pt still progressing with consistency of transfers but is still inconsistent.  Patient encouraged to seek further evaluation from [facility] or seek another means of obtaining a Swedish knee cage to prevent genu recurvatum with gait and improve her gait efficiency and safety. Pt will continue to benefit from skilled physical therapy intervention to address impairments, improve QOL, and attain therapy goals.    OBJECTIVE IMPAIRMENTS: Abnormal gait, cardiopulmonary status limiting activity, decreased activity tolerance, decreased balance, decreased coordination, decreased endurance, decreased knowledge of use of DME, decreased mobility, difficulty walking, decreased ROM, decreased strength, impaired perceived functional ability, impaired flexibility, impaired UE functional use, improper body mechanics, postural dysfunction, obesity, and pain.  ACTIVITY LIMITATIONS: carrying, lifting, bending, sitting, standing, squatting, sleeping, stairs, transfers, bed mobility, bathing, toileting, dressing, reach over head, and caring for others  PARTICIPATION LIMITATIONS: meal prep, cleaning, laundry, interpersonal relationship, driving, shopping, community activity, and church  PERSONAL FACTORS: Age, Behavior pattern, Education, Fitness, Past/current experiences, Profession, Sex, Time since onset of injury/illness/exacerbation, and 3+  comorbidities: DM with diabetic neuropathy, stroke, HTN, AKI, UTI  are also affecting patient's functional outcome.   REHAB POTENTIAL: Fair length of time since CVA  CLINICAL DECISION MAKING: Evolving/moderate complexity  EVALUATION COMPLEXITY: Moderate  PLAN:  PT FREQUENCY: 2x/week  PT DURATION: 12 weeks  PLANNED INTERVENTIONS: Therapeutic exercises, Therapeutic activity, Neuromuscular re-education, Balance training, Gait training, Patient/Family education, Self Care, Joint mobilization, Stair training, Vestibular training, Canalith repositioning, Visual/preceptual remediation/compensation, Orthotic/Fit training, DME instructions, Cognitive remediation, Electrical stimulation, Wheelchair mobility training, Spinal mobilization, Cryotherapy, Moist heat, Splintting, Taping, Traction, Ultrasound, Manual therapy, and Re-evaluation  PLAN FOR NEXT SESSION:  Advance gait training intervals/ volume  Donjoy knee brace to prevent GR Transfer training for LLE weightbearing, positioning and control.   Norman Herrlich PT  Physical Therapist- [facility]   10:41 AM 08/04/23

## 2023-08-09 ENCOUNTER — Ambulatory Visit: Payer: 59 | Admitting: Physical Therapy

## 2023-08-09 ENCOUNTER — Encounter: Payer: 59 | Admitting: Occupational Therapy

## 2023-08-09 DIAGNOSIS — M6281 Muscle weakness (generalized): Secondary | ICD-10-CM

## 2023-08-09 DIAGNOSIS — R2681 Unsteadiness on feet: Secondary | ICD-10-CM

## 2023-08-09 DIAGNOSIS — R262 Difficulty in walking, not elsewhere classified: Secondary | ICD-10-CM

## 2023-08-09 NOTE — Therapy (Signed)
OUTPATIENT PHYSICAL THERAPY TREATMENT    Patient Name: Dana Lucas MRN: [id number] DOB:May 21, 1951, 72 y.o., female Today's Date: 08/09/2023  PCP: Bobbye Morton MD REFERRING PROVIDER: Louis Matte MD   END OF SESSION:  PT End of Session - 08/09/23 0951     Visit Number 56    Number of Visits 60    Date for PT Re-Evaluation 08/17/23    Authorization Type UHC Medicare; [place] Medicaid    Authorization Time Period --    Progress Note Due on Visit 60    PT Start Time 0930    PT Stop Time 1000    PT Time Calculation (min) 30 min    Equipment Utilized During Treatment Gait belt    Activity Tolerance Patient tolerated treatment well;Patient limited by fatigue    Behavior During Therapy WFL for tasks assessed/performed                                 Past Medical History:  Diagnosis Date   Diabetes mellitus without complication (HCC)    Hypertension    Stroke [facility])    Past Surgical History:  Procedure Laterality Date   ABDOMINAL HYSTERECTOMY     COLONOSCOPY N/A 10/06/2019   Procedure: COLONOSCOPY;  Surgeon: Toledo, Boykin Nearing, MD;  Location: [facility] ENDOSCOPY;  Service: Gastroenterology;  Laterality: N/A;   Patient Active Problem List   Diagnosis Date Noted   Hypertension    Diabetes mellitus without complication (HCC)    Stroke (HCC)    GIB (gastrointestinal bleeding)    AKI (acute kidney injury) (HCC)    UTI (urinary tract infection)     ONSET DATE: 2020  REFERRING DIAG: 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   THERAPY DIAG:  Difficulty in walking, not elsewhere classified  Unsteadiness on feet  Muscle weakness (generalized)  Rationale for Evaluation and Treatment: Rehabilitation  SUBJECTIVE:                                                                                                                                                                                             SUBJECTIVE STATEMENT:     Pt reports doing well today. Pt denies any recent falls/stumbles since prior session. Pt denies any updates to medications or medical appointment since prior session.     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 Stroke was four years ago, has had multiple strokes. Had a little bit of therapy, afterwards in  [place]. Moved up to [place] about three years ago. [name] includes DM with diabetic neuropathy, stroke, HTN, AKI, UTI. Uses a walker to walk to the bathroom (two wheel and three wheel walkers).  Has a power chair for out of the house.   PAIN:  Are you having pain? Yes Rt hand, Rt knee 5/10   PRECAUTIONS: Fall  WEIGHT BEARING RESTRICTIONS: No  FALLS: Has patient fallen in last 6 months? No  LIVING ENVIRONMENT: Lives with: lives with their family Lives in: House/apartment Stairs:  ramp outside Has following equipment at home: [name] - 2 wheeled, Environmental consultant - 4 wheeled, Wheelchair (power), shower chair, Ramped entry, and chair lift and hospital bed   PLOF: Independent  PATIENT GOALS: get up and walk by herself, wants to walk with a cane.   OBJECTIVE:   TODAY'S TREATMENT: DATE: 08/09/23    TA Ambulation with rolling walker x 90 feet, brief rest. -x 10 STS with focus on LLE staying in contact with the floor. No walker for support  -Then another ambulation with the same above modifications and same distance.  Pt showing good control of GR REST  Donned 5# AW to the LLE  Ambulation weaving between 3 obstacles, focus on preventing high velocity GR, overall good success. X approx 40 ft   PT session cut a few minutes short secondary to pt needing to get to her ride at a certain time. Session limited to 30 min.   PATIENT EDUCATION: Education details: Pt educated throughout session about proper posture and technique with exercises. Improved exercise technique, movement at  target joints, use of target muscles after min to mod verbal, visual, tactile cues.    HOME EXERCISE PROGRAM: continue HEP as previously given Access Code: [email or web address] URL: [email or web address] Date: 06/09/2023 Prepared by: Thresa Ross  Exercises - Leg Extension  - 1 x daily - 7 x weekly - 2 sets - 10 reps - 5 hold - Seated March  - 1 x daily - 7 x weekly - 2 sets - 10 reps - 5 hold - Seated Hip Abduction  - 1 x daily - 7 x weekly - 2 sets - 10 reps - 5 hold - Seated Hip Adduction Isometrics with Ball  - 1 x daily - 7 x weekly - 2 sets - 10 reps - 5 hold - Sit to Stand with Armchair  - 1 x daily - 7 x weekly - 2 sets - 5 reps - Side Stepping with Counter Support  - 1 x daily - 7 x weekly - 2 sets - 10 reps  GOALS: Goals reviewed with patient? Yes  SHORT TERM GOALS: Target date: 06/29/2023    Patient will be independent in home exercise program to improve strength/mobility for better functional independence with ADLs. Baseline: 01/26/2023: pt has some difficulty with HEP 06/09/23: completing 3 x per week ( on days not in therapy) Goal status: MET    LONG TERM GOALS: Target date: 08/17/2023    Patient will increase FOTO score to equal to or greater than 53    to demonstrate statistically significant improvement in mobility and quality of life.  Baseline: 3/5: 46%; 01/26/23: 45% ; 03/01/23: 42% 04/28/23: 52.5  8/22 54 9/6:53 Goal status: MET  2.  Patient (> 1 years old) will complete five times sit to stand test in < 15 seconds indicating an increased LE strength and improved balance. Baseline: 3/5: 38 seconds with SUE support; blocking of LLE; 01/26/23 18 seconds with pulling to stand; 03/01/23:  17 seconds pull to stand  04/28/23. 21.27(average of 2 trials) BUE on RW and CGA from PT  8/21 17.4sec with BUE support on RW and CGA from PT   06/08/20: 15.94 secB UE support, mostly completes with the R LE, does not get full erect posture each rep  Goal status: IN  PROGRESS  3.  Patient will increase 10 meter walk test to >1.43m/s as to improve gait speed for better community ambulation and to reduce fall risk Baseline: 3/5: 1 min 43 seconds with RW and w/c follow; 01/26/2023: 2 min 6 seconds; 03/01/23: 1 min 11 sec, 0.14 m/s with WC follow and 2WW. 04/28/23: 56 sec (average of 2 trials) With 2WW.   8/22: 54.3 sec (average of 3 trials) With 2WW.  9/6:70 sec, 69 sec, 70 sec ( average of 70 sec) 10/16:44.62 sec Goal status: IN PROGRESS  4.  Patient will increase BLE gross strength to 4/5 as to improve functional strength for independent gait, increased standing tolerance and increased ADL ability. Baseline: 3/5: grossly 2/5 LLE; 4//25/24: RLE grossly 4-/5, LLE grossly 2/5; 03/01/23 RLE grossly 4+/5 and LLE grossly 3+/5, exception 0/5 PF/DF (overall gross Bilat LE strength around 4-/5) 7/26: RLE 4+/5 except 5/5 knee extension. LLE: 4-/5 knee extension, hip adduction 4+/5, all others 3+/5 except ankle DF 2/5 and PF 2-/5  8/22: LLE: hip flexion: 3+,hip adduction 4+, abduction 4, knee extension 4-(delayed activation), knee flexion 4-/5 delayed activation. DF/PF: 1  10/16:RLE 4+/5 globally LLE: 3+ for knee flexion, 4- for hip flexion and knee extension, 4/5 for hip ABD/ ADD,  Goal status: PARTIALLY MET  5.  Patient will increase 2 min walk test by >49ft to indicate improved access to community and safety with gait through house.  Baseline: 64ft with RW, AFO, and CGA from PT 9/6:65 ft with RW  10/16: 85 ft with RW  Goal status: ONGOING/PROGRESSING   ASSESSMENT:  CLINICAL IMPRESSION:   Patient presents with good motivation for completion of physical therapy activities. Patient continues to progress with ambulation tasks this date showing improved control of GR, although GR still present pt velocity going into GR is much better controlled following using donjoy brace and PT feedback in previous session. PT session cut short secondary to pt needing to get to her  ride at a specific time this date. Pt will continue to benefit from skilled physical therapy intervention to address impairments, improve QOL, and attain therapy goals.    OBJECTIVE IMPAIRMENTS: Abnormal gait, cardiopulmonary status limiting activity, decreased activity tolerance, decreased balance, decreased coordination, decreased endurance, decreased knowledge of use of DME, decreased mobility, difficulty walking, decreased ROM, decreased strength, impaired perceived functional ability, impaired flexibility, impaired UE functional use, improper body mechanics, postural dysfunction, obesity, and pain.   ACTIVITY LIMITATIONS: carrying, lifting, bending, sitting, standing, squatting, sleeping, stairs, transfers, bed mobility, bathing, toileting, dressing, reach over head, and caring for others  PARTICIPATION LIMITATIONS: meal prep, cleaning, laundry, interpersonal relationship, driving, shopping, community activity, and church  PERSONAL FACTORS: Age, Behavior pattern, Education, Fitness, Past/current experiences, Profession, Sex, Time since onset of injury/illness/exacerbation, and 3+ comorbidities: DM with diabetic neuropathy, stroke, HTN, AKI, UTI  are also affecting patient's functional outcome.   REHAB POTENTIAL: Fair length of time since CVA  CLINICAL DECISION MAKING: Evolving/moderate complexity  EVALUATION COMPLEXITY: Moderate  PLAN:  PT FREQUENCY: 2x/week  PT DURATION: 12 weeks  PLANNED INTERVENTIONS: Therapeutic exercises, Therapeutic activity, Neuromuscular re-education, Balance training, Gait training, Patient/Family education, Self Care, Joint mobilization, Stair training, Vestibular training,  Canalith repositioning, Visual/preceptual remediation/compensation, Orthotic/Fit training, DME instructions, Cognitive remediation, Electrical stimulation, Wheelchair mobility training, Spinal mobilization, Cryotherapy, Moist heat, Splintting, Taping, Traction, Ultrasound, Manual therapy, and  Re-evaluation  PLAN FOR NEXT SESSION:  Advance gait training intervals/ volume  Donjoy knee brace to prevent GR Transfer training for LLE weightbearing, positioning and control.   Norman Herrlich PT  Physical Therapist- [facility]  [facility]   9:52 AM 08/09/23

## 2023-08-11 ENCOUNTER — Ambulatory Visit: Payer: 59 | Admitting: Physical Therapy

## 2023-08-16 ENCOUNTER — Encounter: Payer: Self-pay | Admitting: Physical Therapy

## 2023-08-16 ENCOUNTER — Ambulatory Visit: Payer: 59 | Admitting: Physical Therapy

## 2023-08-16 ENCOUNTER — Encounter: Payer: 59 | Admitting: Occupational Therapy

## 2023-08-16 DIAGNOSIS — R262 Difficulty in walking, not elsewhere classified: Secondary | ICD-10-CM | POA: Diagnosis not present

## 2023-08-16 DIAGNOSIS — G8929 Other chronic pain: Secondary | ICD-10-CM

## 2023-08-16 DIAGNOSIS — M6281 Muscle weakness (generalized): Secondary | ICD-10-CM

## 2023-08-16 DIAGNOSIS — R2681 Unsteadiness on feet: Secondary | ICD-10-CM

## 2023-08-16 NOTE — Therapy (Signed)
OUTPATIENT PHYSICAL THERAPY TREATMENT    Patient Name: Randilyn Krigbaum MRN: [id number] DOB:03-Nov-1950, 72 y.o., female Today's Date: 08/16/2023  PCP: Bobbye Morton MD REFERRING PROVIDER: Louis Matte MD   END OF SESSION:  PT End of Session - 08/16/23 0931     Visit Number 57    Number of Visits 60    Date for PT Re-Evaluation 08/17/23    Authorization Type UHC Medicare; [place] Medicaid    Progress Note Due on Visit 60    PT Start Time 0932    PT Stop Time 1013    PT Time Calculation (min) 41 min    Equipment Utilized During Treatment Gait belt    Activity Tolerance Patient tolerated treatment well;Patient limited by fatigue    Behavior During Therapy WFL for tasks assessed/performed                                 Past Medical History:  Diagnosis Date   Diabetes mellitus without complication (HCC)    Hypertension    Stroke [facility])    Past Surgical History:  Procedure Laterality Date   ABDOMINAL HYSTERECTOMY     COLONOSCOPY N/A 10/06/2019   Procedure: COLONOSCOPY;  Surgeon: Toledo, Boykin Nearing, MD;  Location: [facility] ENDOSCOPY;  Service: Gastroenterology;  Laterality: N/A;   Patient Active Problem List   Diagnosis Date Noted   Hypertension    Diabetes mellitus without complication (HCC)    Stroke (HCC)    GIB (gastrointestinal bleeding)    AKI (acute kidney injury) (HCC)    UTI (urinary tract infection)     ONSET DATE: 2020  REFERRING DIAG: 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   THERAPY DIAG:  Difficulty in walking, not elsewhere classified  Unsteadiness on feet  Muscle weakness (generalized)  Chronic pain of right knee  Rationale for Evaluation and Treatment: Rehabilitation  SUBJECTIVE:                                                                                                                                                                                             SUBJECTIVE STATEMENT:     Pt reports doing well today. Pt denies any recent falls/stumbles since prior session. Pt denies any updates to medications or medical appointment since prior session.     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 Stroke was four years ago, has had multiple strokes. Had a little bit of therapy, afterwards in [place].  Moved up to [place] about three years ago. [name] includes DM with diabetic neuropathy, stroke, HTN, AKI, UTI. Uses a walker to walk to the bathroom (two wheel and three wheel walkers).  Has a power chair for out of the house.   PAIN:  Are you having pain? Yes Rt hand, Rt knee 5/10   PRECAUTIONS: Fall  WEIGHT BEARING RESTRICTIONS: No  FALLS: Has patient fallen in last 6 months? No  LIVING ENVIRONMENT: Lives with: lives with their family Lives in: House/apartment Stairs:  ramp outside Has following equipment at home: [name] - 2 wheeled, Environmental consultant - 4 wheeled, Wheelchair (power), shower chair, Ramped entry, and chair lift and hospital bed   PLOF: Independent  PATIENT GOALS: get up and walk by herself, wants to walk with a cane.   OBJECTIVE:   TODAY'S TREATMENT: DATE: 08/16/23    TA Donned 4# AW to thhe LLE for all the below interventions  Ambulation in square around gym machines working on multiplanar movement patterns x 3 rounds with STS below in between sets  STS from elevated plinth, UE on walker ( R) x 5, ,8,8 respectively after each set of walking   TE:  LAQ with 4# AW on LLE  Doffed AW Seated march ( no trunk support for increased core activation) 2 x 10   PATIENT EDUCATION: Education details: Pt educated throughout session about proper posture and technique with exercises. Improved exercise technique, movement at target joints, use of target muscles after min to mod verbal, visual, tactile cues.    HOME EXERCISE PROGRAM: continue HEP as previously  given Access Code: [email or web address] URL: [email or web address] Date: 06/09/2023 Prepared by: Thresa Ross  Exercises - Leg Extension  - 1 x daily - 7 x weekly - 2 sets - 10 reps - 5 hold - Seated March  - 1 x daily - 7 x weekly - 2 sets - 10 reps - 5 hold - Seated Hip Abduction  - 1 x daily - 7 x weekly - 2 sets - 10 reps - 5 hold - Seated Hip Adduction Isometrics with Ball  - 1 x daily - 7 x weekly - 2 sets - 10 reps - 5 hold - Sit to Stand with Armchair  - 1 x daily - 7 x weekly - 2 sets - 5 reps - Side Stepping with Counter Support  - 1 x daily - 7 x weekly - 2 sets - 10 reps  GOALS: Goals reviewed with patient? Yes  SHORT TERM GOALS: Target date: 06/29/2023    Patient will be independent in home exercise program to improve strength/mobility for better functional independence with ADLs. Baseline: 01/26/2023: pt has some difficulty with HEP 06/09/23: completing 3 x per week ( on days not in therapy) Goal status: MET    LONG TERM GOALS: Target date: 08/17/2023    Patient will increase FOTO score to equal to or greater than 53    to demonstrate statistically significant improvement in mobility and quality of life.  Baseline: 3/5: 46%; 01/26/23: 45% ; 03/01/23: 42% 04/28/23: 52.5  8/22 54 9/6:53 Goal status: MET  2.  Patient (> 62 years old) will complete five times sit to stand test in < 15 seconds indicating an increased LE strength and improved balance. Baseline: 3/5: 38 seconds with SUE support; blocking of LLE; 01/26/23 18 seconds with pulling to stand; 03/01/23: 17 seconds pull to stand  04/28/23. 21.27(average of 2 trials) BUE on RW and CGA from PT  8/21 17.4sec with BUE  support on RW and CGA from PT   06/08/20: 15.94 secB UE support, mostly completes with the R LE, does not get full erect posture each rep  Goal status: IN PROGRESS  3.  Patient will increase 10 meter walk test to >1.48m/s as to improve gait speed for better community ambulation and to reduce fall  risk Baseline: 3/5: 1 min 43 seconds with RW and w/c follow; 01/26/2023: 2 min 6 seconds; 03/01/23: 1 min 11 sec, 0.14 m/s with WC follow and 2WW. 04/28/23: 56 sec (average of 2 trials) With 2WW.   8/22: 54.3 sec (average of 3 trials) With 2WW.  9/6:70 sec, 69 sec, 70 sec ( average of 70 sec) 10/16:44.62 sec Goal status: IN PROGRESS  4.  Patient will increase BLE gross strength to 4/5 as to improve functional strength for independent gait, increased standing tolerance and increased ADL ability. Baseline: 3/5: grossly 2/5 LLE; 4//25/24: RLE grossly 4-/5, LLE grossly 2/5; 03/01/23 RLE grossly 4+/5 and LLE grossly 3+/5, exception 0/5 PF/DF (overall gross Bilat LE strength around 4-/5) 7/26: RLE 4+/5 except 5/5 knee extension. LLE: 4-/5 knee extension, hip adduction 4+/5, all others 3+/5 except ankle DF 2/5 and PF 2-/5  8/22: LLE: hip flexion: 3+,hip adduction 4+, abduction 4, knee extension 4-(delayed activation), knee flexion 4-/5 delayed activation. DF/PF: 1  10/16:RLE 4+/5 globally LLE: 3+ for knee flexion, 4- for hip flexion and knee extension, 4/5 for hip ABD/ ADD,  Goal status: PARTIALLY MET  5.  Patient will increase 2 min walk test by >51ft to indicate improved access to community and safety with gait through house.  Baseline: 42ft with RW, AFO, and CGA from PT 9/6:65 ft with RW  10/16: 85 ft with RW  Goal status: ONGOING/PROGRESSING   ASSESSMENT:  CLINICAL IMPRESSION:   Patient presents with good motivation for completion of physical therapy activities. Patient continues to progress with ambulation tasks this date showing improved control of GR, although GR still present pt velocity going into GR is much better controlled following using donjoy brace and PT feedback in previous session. Pt progressed with multiplanar walking this date with good results. Will assess goals next session for re-certification note. Pt will continue to benefit from skilled physical therapy intervention to  address impairments, improve QOL, and attain therapy goals.    OBJECTIVE IMPAIRMENTS: Abnormal gait, cardiopulmonary status limiting activity, decreased activity tolerance, decreased balance, decreased coordination, decreased endurance, decreased knowledge of use of DME, decreased mobility, difficulty walking, decreased ROM, decreased strength, impaired perceived functional ability, impaired flexibility, impaired UE functional use, improper body mechanics, postural dysfunction, obesity, and pain.   ACTIVITY LIMITATIONS: carrying, lifting, bending, sitting, standing, squatting, sleeping, stairs, transfers, bed mobility, bathing, toileting, dressing, reach over head, and caring for others  PARTICIPATION LIMITATIONS: meal prep, cleaning, laundry, interpersonal relationship, driving, shopping, community activity, and church  PERSONAL FACTORS: Age, Behavior pattern, Education, Fitness, Past/current experiences, Profession, Sex, Time since onset of injury/illness/exacerbation, and 3+ comorbidities: DM with diabetic neuropathy, stroke, HTN, AKI, UTI  are also affecting patient's functional outcome.   REHAB POTENTIAL: Fair length of time since CVA  CLINICAL DECISION MAKING: Evolving/moderate complexity  EVALUATION COMPLEXITY: Moderate  PLAN:  PT FREQUENCY: 2x/week  PT DURATION: 12 weeks  PLANNED INTERVENTIONS: Therapeutic exercises, Therapeutic activity, Neuromuscular re-education, Balance training, Gait training, Patient/Family education, Self Care, Joint mobilization, Stair training, Vestibular training, Canalith repositioning, Visual/preceptual remediation/compensation, Orthotic/Fit training, DME instructions, Cognitive remediation, Electrical stimulation, Wheelchair mobility training, Spinal mobilization, Cryotherapy, Moist heat, Splintting, Taping, Traction, Ultrasound,  Manual therapy, and Re-evaluation  PLAN FOR NEXT SESSION:  Advance gait training intervals/ volume  Donjoy knee brace to  prevent GR Transfer training for LLE weightbearing, positioning and control.   Norman Herrlich PT  Physical Therapist- [facility]  [facility]   9:32 AM 08/16/23

## 2023-08-17 ENCOUNTER — Ambulatory Visit (INDEPENDENT_AMBULATORY_CARE_PROVIDER_SITE_OTHER): Payer: 59 | Admitting: Podiatry

## 2023-08-17 ENCOUNTER — Encounter: Payer: Self-pay | Admitting: Podiatry

## 2023-08-17 DIAGNOSIS — B351 Tinea unguium: Secondary | ICD-10-CM

## 2023-08-17 DIAGNOSIS — M79674 Pain in right toe(s): Secondary | ICD-10-CM

## 2023-08-17 DIAGNOSIS — M79675 Pain in left toe(s): Secondary | ICD-10-CM

## 2023-08-17 NOTE — Progress Notes (Signed)
  Subjective:  Patient ID: Dana Lucas, female    DOB: 01/12/1951,  MRN: [id number]  Chief Complaint  Patient presents with   Diabetes    "I'm supposed to get my toenails done."   72 y.o. female returns for the above complaint.  Patient presents with thickened onychodystrophy mycotic toenails x 10 Martin on palpation her hurts with ambulation is with pressure patient would like for me debride down she is not able to do it herself denies any other acute complaints  Objective:  There were no vitals filed for this visit. Podiatric Exam: Vascular: dorsalis pedis and posterior tibial pulses are palpable bilateral. Capillary return is immediate. Temperature gradient is WNL. Skin turgor WNL  Sensorium: Normal Semmes Weinstein monofilament test. Normal tactile sensation bilaterally. Nail Exam: Pt has thick disfigured discolored nails with subungual debris noted bilateral entire nail hallux through fifth toenails.  Pain on palpation to the nails. Ulcer Exam: There is no evidence of ulcer or pre-ulcerative changes or infection. Orthopedic Exam: Muscle tone and strength are WNL. No limitations in general ROM. No crepitus or effusions noted.  Skin: No Porokeratosis. No infection or ulcers    Assessment & Plan:   1. Pain due to onychomycosis of toenails of both feet     Patient was evaluated and treated and all questions answered.  Onychomycosis with pain  -Nails palliatively debrided as below. -Educated on self-care  Procedure: Nail Debridement Rationale: pain  Type of Debridement: manual, sharp debridement. Instrumentation: Nail nipper, rotary burr. Number of Nails: 10  Procedures and Treatment: Consent by patient was obtained for treatment procedures. The patient understood the discussion of treatment and procedures well. All questions were answered thoroughly reviewed. Debridement of mycotic and hypertrophic toenails, 1 through 5 bilateral and clearing of subungual debris. No  ulceration, no infection noted.  Return Visit-Office Procedure: Patient instructed to return to the office for a follow up visit 3 months for continued evaluation and treatment.  Nicholes Rough, DPM    No follow-ups on file.

## 2023-08-18 ENCOUNTER — Ambulatory Visit: Payer: 59 | Admitting: Physical Therapy

## 2023-08-23 ENCOUNTER — Ambulatory Visit: Payer: 59 | Admitting: Physical Therapy

## 2023-08-23 ENCOUNTER — Encounter: Payer: 59 | Admitting: Occupational Therapy

## 2023-08-23 ENCOUNTER — Encounter: Payer: Self-pay | Admitting: Physical Therapy

## 2023-08-23 DIAGNOSIS — R2681 Unsteadiness on feet: Secondary | ICD-10-CM

## 2023-08-23 DIAGNOSIS — R262 Difficulty in walking, not elsewhere classified: Secondary | ICD-10-CM | POA: Diagnosis not present

## 2023-08-23 DIAGNOSIS — M6281 Muscle weakness (generalized): Secondary | ICD-10-CM

## 2023-08-23 NOTE — Therapy (Signed)
OUTPATIENT PHYSICAL THERAPY TREATMENT / RECERT    Patient Name: Dana Lucas MRN: [id number] DOB:[birth year], 72 y.o., female Today's Date: 08/23/2023  PCP: Bobbye Morton MD REFERRING PROVIDER: Louis Matte MD   END OF SESSION:  PT End of Session - 08/23/23 0939     Visit Number 58    Number of Visits 60    Date for PT Re-Evaluation 09/11/23    Authorization Type UHC Medicare; [place] Medicaid    Progress Note Due on Visit 60    PT Start Time 0931    PT Stop Time 1013    PT Time Calculation (min) 42 min    Equipment Utilized During Treatment Gait belt    Activity Tolerance Patient tolerated treatment well;Patient limited by fatigue    Behavior During Therapy WFL for tasks assessed/performed                                  Past Medical History:  Diagnosis Date   Diabetes mellitus without complication (HCC)    Hypertension    Stroke [facility])    Past Surgical History:  Procedure Laterality Date   ABDOMINAL HYSTERECTOMY     COLONOSCOPY N/A 10/06/2019   Procedure: COLONOSCOPY;  Surgeon: Toledo, Boykin Nearing, MD;  Location: [facility] ENDOSCOPY;  Service: Gastroenterology;  Laterality: N/A;   Patient Active Problem List   Diagnosis Date Noted   Hypertension    Diabetes mellitus without complication (HCC)    Stroke (HCC)    GIB (gastrointestinal bleeding)    AKI (acute kidney injury) (HCC)    UTI (urinary tract infection)     ONSET DATE: 2020  REFERRING DIAG: 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   THERAPY DIAG:  Difficulty in walking, not elsewhere classified  Unsteadiness on feet  Muscle weakness (generalized)  Rationale for Evaluation and Treatment: Rehabilitation  SUBJECTIVE:                                                                                                                                                                                             SUBJECTIVE STATEMENT:    Pt reports  doing well today. Pt denies any recent falls/stumbles since prior session. Pt denies any updates to medications or medical appointment since prior session.     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 Stroke was four years ago, has had multiple strokes. Had a little bit of therapy, afterwards in [place]. Moved up to  [place] about three years ago. [name] includes DM with diabetic neuropathy, stroke, HTN, AKI, UTI. Uses a walker to walk to the bathroom (two wheel and three wheel walkers).  Has a power chair for out of the house.   PAIN:  Are you having pain? Yes Rt hand, Rt knee 5/10   PRECAUTIONS: Fall  WEIGHT BEARING RESTRICTIONS: No  FALLS: Has patient fallen in last 6 months? No  LIVING ENVIRONMENT: Lives with: lives with their family Lives in: House/apartment Stairs:  ramp outside Has following equipment at home: [name] - 2 wheeled, Environmental consultant - 4 wheeled, Wheelchair (power), shower chair, Ramped entry, and chair lift and hospital bed   PLOF: Independent  PATIENT GOALS: get up and walk by herself, wants to walk with a cane.   OBJECTIVE:   TODAY'S TREATMENT: DATE: 08/23/23   Physical therapy treatment session today consisted of completing assessment of goals and administration of testing as demonstrated and documented in flow sheet, treatment, and goals section of this note. Addition treatments may be found below.   Heel slides in seated with foot on towel 2 x 10 reps  LAQ seated with tactile cues for proper muscle activation 2 x 10 reps  PATIENT EDUCATION: Education details: Pt educated throughout session about proper posture and technique with exercises. Improved exercise technique, movement at target joints, use of target muscles after min to mod verbal, visual, tactile cues.    HOME EXERCISE PROGRAM: continue HEP as previously given Access Code: [email or web address] URL:  [email or web address] Date: 06/09/2023 Prepared by: Thresa Ross  Exercises - Leg Extension  - 1 x daily - 7 x weekly - 2 sets - 10 reps - 5 hold - Seated March  - 1 x daily - 7 x weekly - 2 sets - 10 reps - 5 hold - Seated Hip Abduction  - 1 x daily - 7 x weekly - 2 sets - 10 reps - 5 hold - Seated Hip Adduction Isometrics with Ball  - 1 x daily - 7 x weekly - 2 sets - 10 reps - 5 hold - Sit to Stand with Armchair  - 1 x daily - 7 x weekly - 2 sets - 5 reps - Side Stepping with Counter Support  - 1 x daily - 7 x weekly - 2 sets - 10 reps  GOALS: Goals reviewed with patient? Yes  SHORT TERM GOALS: Target date: 06/29/2023    Patient will be independent in home exercise program to improve strength/mobility for better functional independence with ADLs. Baseline: 01/26/2023: pt has some difficulty with HEP 06/09/23: completing 3 x per week ( on days not in therapy) Goal status: MET    LONG TERM GOALS: Target date: 08/17/2023    Patient will increase FOTO score to equal to or greater than 53    to demonstrate statistically significant improvement in mobility and quality of life.  Baseline: 3/5: 46%; 01/26/23: 45% ; 03/01/23: 42% 04/28/23: 52.5  8/22 54 9/6:53 Goal status: MET  2.  Patient (> 72 years old) will complete five times sit to stand test in < 15 seconds indicating an increased LE strength and improved balance. Baseline: 3/5: 38 seconds with SUE support; blocking of LLE; 01/26/23 18 seconds with pulling to stand; 03/01/23: 17 seconds pull to stand  04/28/23. 21.27(average of 2 trials) BUE on RW and CGA from PT  8/21 17.4sec with BUE support on RW and CGA from PT   06/08/20: 15.94 secB UE support, mostly completes with the R LE,  does not get full erect posture each rep  11/20: Good erect posture each rep, 20 sec Goal status: IN PROGRESS  3.  Patient will increase 10 meter walk test to >1.42m/s as to improve gait speed for better community ambulation and to reduce  fall risk Baseline: 3/5: 1 min 43 seconds with RW and w/c follow; 01/26/2023: 2 min 6 seconds; 03/01/23: 1 min 11 sec, 0.14 m/s with WC follow and 2WW. 04/28/23: 56 sec (average of 2 trials) With 2WW.   8/22: 54.3 sec (average of 3 trials) With 2WW.  9/6:70 sec, 69 sec, 70 sec ( average of 70 sec) 10/16:44.62 sec Goal status: IN PROGRESS  4.  Patient will increase BLE gross strength to 4/5 as to improve functional strength for independent gait, increased standing tolerance and increased ADL ability. Baseline: 3/5: grossly 2/5 LLE; 4//25/24: RLE grossly 4-/5, LLE grossly 2/5; 03/01/23 RLE grossly 4+/5 and LLE grossly 3+/5, exception 0/5 PF/DF (overall gross Bilat LE strength around 4-/5) 7/26: RLE 4+/5 except 5/5 knee extension. LLE: 4-/5 knee extension, hip adduction 4+/5, all others 3+/5 except ankle DF 2/5 and PF 2-/5  8/22: LLE: hip flexion: 3+,hip adduction 4+, abduction 4, knee extension 4-(delayed activation), knee flexion 4-/5 delayed activation. DF/PF: 1  10/16:RLE 4+/5 globally LLE: 3+ for knee flexion, 4- for hip flexion and knee extension, 4/5 for hip ABD/ ADD,  Goal status: PARTIALLY MET  5.  Patient will increase 2 min walk test by >36ft to indicate improved access to community and safety with gait through house.  Baseline: 51ft with RW, AFO, and CGA from PT 9/6:65 ft with RW  10/16: 85 ft with RW  11/20: 79 ft  with RW  Goal status: ONGOING  ASSESSMENT:  CLINICAL IMPRESSION:   Pt presents for recert note this date. Pt shows similar results to her previous assessment of goals. Pt has made a plateau in her progress. Discussed plan for discharge with patient this date and pt would like to continue for a few weeks to work toward an advanced independent Hep prior to discharge.  Patient will continue to benefit from skilled physical therapy in order to provide her with advanced home exercise program in order to substantiate maintenance of her progress in therapy thus far.   OBJECTIVE  IMPAIRMENTS: Abnormal gait, cardiopulmonary status limiting activity, decreased activity tolerance, decreased balance, decreased coordination, decreased endurance, decreased knowledge of use of DME, decreased mobility, difficulty walking, decreased ROM, decreased strength, impaired perceived functional ability, impaired flexibility, impaired UE functional use, improper body mechanics, postural dysfunction, obesity, and pain.   ACTIVITY LIMITATIONS: carrying, lifting, bending, sitting, standing, squatting, sleeping, stairs, transfers, bed mobility, bathing, toileting, dressing, reach over head, and caring for others  PARTICIPATION LIMITATIONS: meal prep, cleaning, laundry, interpersonal relationship, driving, shopping, community activity, and church  PERSONAL FACTORS: Age, Behavior pattern, Education, Fitness, Past/current experiences, Profession, Sex, Time since onset of injury/illness/exacerbation, and 3+ comorbidities: DM with diabetic neuropathy, stroke, HTN, AKI, UTI  are also affecting patient's functional outcome.   REHAB POTENTIAL: Fair length of time since CVA  CLINICAL DECISION MAKING: Evolving/moderate complexity  EVALUATION COMPLEXITY: Moderate  PLAN:  PT FREQUENCY: 2x/week  PT DURATION: 12 weeks  PLANNED INTERVENTIONS: Therapeutic exercises, Therapeutic activity, Neuromuscular re-education, Balance training, Gait training, Patient/Family education, Self Care, Joint mobilization, Stair training, Vestibular training, Canalith repositioning, Visual/preceptual remediation/compensation, Orthotic/Fit training, DME instructions, Cognitive remediation, Electrical stimulation, Wheelchair mobility training, Spinal mobilization, Cryotherapy, Moist heat, Splintting, Taping, Traction, Ultrasound, Manual therapy, and Re-evaluation  PLAN FOR  NEXT SESSION:  Advanced home exercise program for preparation for discharge.  Plan for discharge in week of December 6 Norman Herrlich PT  Physical  Therapist- [facility]  [facility]   12:45 PM 08/23/23

## 2023-08-24 NOTE — Therapy (Signed)
OUTPATIENT PHYSICAL THERAPY TREATMENT / RECERT    Patient Name: Dana Lucas MRN: [id number] DOB:[birth year], 72 y.o., female Today's Date: 08/25/2023  PCP: Bobbye Morton MD REFERRING PROVIDER: Louis Matte MD   END OF SESSION:  PT End of Session - 08/25/23 1009     Visit Number 59    Number of Visits 60    Date for PT Re-Evaluation 09/11/23    Authorization Type UHC Medicare; [place] Medicaid    Progress Note Due on Visit 60    PT Start Time 1005    PT Stop Time 1049    PT Time Calculation (min) 44 min    Equipment Utilized During Treatment Gait belt    Activity Tolerance Patient tolerated treatment well;Patient limited by fatigue    Behavior During Therapy WFL for tasks assessed/performed                                   Past Medical History:  Diagnosis Date   Diabetes mellitus without complication (HCC)    Hypertension    Stroke [facility])    Past Surgical History:  Procedure Laterality Date   ABDOMINAL HYSTERECTOMY     COLONOSCOPY N/A 10/06/2019   Procedure: COLONOSCOPY;  Surgeon: Toledo, Boykin Nearing, MD;  Location: [facility] ENDOSCOPY;  Service: Gastroenterology;  Laterality: N/A;   Patient Active Problem List   Diagnosis Date Noted   Hypertension    Diabetes mellitus without complication (HCC)    Stroke (HCC)    GIB (gastrointestinal bleeding)    AKI (acute kidney injury) (HCC)    UTI (urinary tract infection)     ONSET DATE: 2020  REFERRING DIAG: 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   THERAPY DIAG:  Difficulty in walking, not elsewhere classified  Unsteadiness on feet  Muscle weakness (generalized)  Rationale for Evaluation and Treatment: Rehabilitation  SUBJECTIVE:                                                                                                                                                                                             SUBJECTIVE STATEMENT:    Pt reports  doing well today. Pt denies any recent falls/stumbles since prior session. Pt denies any updates to medications or medical appointment since prior session.     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 Stroke was four years ago, has had multiple strokes. Had a little bit of therapy, afterwards in [place]. Moved up  to [place] about three years ago. [name] includes DM with diabetic neuropathy, stroke, HTN, AKI, UTI. Uses a walker to walk to the bathroom (two wheel and three wheel walkers).  Has a power chair for out of the house.   PAIN:  Are you having pain? Yes Rt hand, Rt knee 5/10   PRECAUTIONS: Fall  WEIGHT BEARING RESTRICTIONS: No  FALLS: Has patient fallen in last 6 months? No  LIVING ENVIRONMENT: Lives with: lives with their family Lives in: House/apartment Stairs:  ramp outside Has following equipment at home: [name] - 2 wheeled, Environmental consultant - 4 wheeled, Wheelchair (power), shower chair, Ramped entry, and chair lift and hospital bed   PLOF: Independent  PATIENT GOALS: get up and walk by herself, wants to walk with a cane.   OBJECTIVE:   TODAY'S TREATMENT: DATE: 08/25/23 TE Transfer to nustep with RW and CGA Nustep level 2 x 6 min, assistance with LE placement and donned seatbelt   Ambulation intervals 2 x 75 ft  to simulate walking in the home, cues for control of GR throughout   STS 2 x 10 reps, cues for foot placement ( stance width and orientation) . Much improved ease of movement and balance with this correction.  Standing march  x 10 with UE support Heel slides in seated with foot on towel 2 x 10 reps  LAQ seated with tactile cues and min A for proper muscle activation 2 x 10 reps  PATIENT EDUCATION: Education details: Pt educated throughout session about proper posture and technique with exercises. Improved exercise technique, movement at target joints, use of  target muscles after min to mod verbal, visual, tactile cues.    HOME EXERCISE PROGRAM: continue HEP as previously given Access Code: [email or web address] URL: [email or web address] Date: 06/09/2023 Prepared by: Thresa Ross  Exercises - Leg Extension  - 1 x daily - 7 x weekly - 2 sets - 10 reps - 5 hold - Seated March  - 1 x daily - 7 x weekly - 2 sets - 10 reps - 5 hold - Seated Hip Abduction  - 1 x daily - 7 x weekly - 2 sets - 10 reps - 5 hold - Seated Hip Adduction Isometrics with Ball  - 1 x daily - 7 x weekly - 2 sets - 10 reps - 5 hold - Sit to Stand with Armchair  - 1 x daily - 7 x weekly - 2 sets - 5 reps - Side Stepping with Counter Support  - 1 x daily - 7 x weekly - 2 sets - 10 reps  GOALS: Goals reviewed with patient? Yes  SHORT TERM GOALS: Target date: 06/29/2023    Patient will be independent in home exercise program to improve strength/mobility for better functional independence with ADLs. Baseline: 01/26/2023: pt has some difficulty with HEP 06/09/23: completing 3 x per week ( on days not in therapy) Goal status: MET    LONG TERM GOALS: Target date: 08/17/2023    Patient will increase FOTO score to equal to or greater than 53    to demonstrate statistically significant improvement in mobility and quality of life.  Baseline: 3/5: 46%; 01/26/23: 45% ; 03/01/23: 42% 04/28/23: 52.5  8/22 54 9/6:53 Goal status: MET  2.  Patient (> 51 years old) will complete five times sit to stand test in < 15 seconds indicating an increased LE strength and improved balance. Baseline: 3/5: 38 seconds with SUE support; blocking of LLE; 01/26/23 18 seconds with pulling to stand; 03/01/23:  17 seconds pull to stand  04/28/23. 21.27(average of 2 trials) BUE on RW and CGA from PT  8/21 17.4sec with BUE support on RW and CGA from PT   06/08/20: 15.94 secB UE support, mostly completes with the R LE, does not get full erect posture each rep  11/20: Good erect posture each rep, 20  sec Goal status: IN PROGRESS  3.  Patient will increase 10 meter walk test to >1.46m/s as to improve gait speed for better community ambulation and to reduce fall risk Baseline: 3/5: 1 min 43 seconds with RW and w/c follow; 01/26/2023: 2 min 6 seconds; 03/01/23: 1 min 11 sec, 0.14 m/s with WC follow and 2WW. 04/28/23: 56 sec (average of 2 trials) With 2WW.   8/22: 54.3 sec (average of 3 trials) With 2WW.  9/6:70 sec, 69 sec, 70 sec ( average of 70 sec) 10/16:44.62 sec Goal status: IN PROGRESS  4.  Patient will increase BLE gross strength to 4/5 as to improve functional strength for independent gait, increased standing tolerance and increased ADL ability. Baseline: 3/5: grossly 2/5 LLE; 4//25/24: RLE grossly 4-/5, LLE grossly 2/5; 03/01/23 RLE grossly 4+/5 and LLE grossly 3+/5, exception 0/5 PF/DF (overall gross Bilat LE strength around 4-/5) 7/26: RLE 4+/5 except 5/5 knee extension. LLE: 4-/5 knee extension, hip adduction 4+/5, all others 3+/5 except ankle DF 2/5 and PF 2-/5  8/22: LLE: hip flexion: 3+,hip adduction 4+, abduction 4, knee extension 4-(delayed activation), knee flexion 4-/5 delayed activation. DF/PF: 1  10/16:RLE 4+/5 globally LLE: 3+ for knee flexion, 4- for hip flexion and knee extension, 4/5 for hip ABD/ ADD,  Goal status: PARTIALLY MET  5.  Patient will increase 2 min walk test by >16ft to indicate improved access to community and safety with gait through house.  Baseline: 73ft with RW, AFO, and CGA from PT 9/6:65 ft with RW  10/16: 85 ft with RW  11/20: 79 ft  with RW  Goal status: ONGOING  ASSESSMENT:  CLINICAL IMPRESSION:   Patient arrived with good motivation form completion of pt activities.  Physical therapy session continued with focused general exercise in order to progress towards patient's independent completion of activities at discharge and maintenance of her progress.  Patient progresses well this date but still requires cues and tactile feedback for proper  muscle activation and performance with activities. Pt will continue to benefit from skilled physical therapy intervention to address impairments, improve QOL, and attain therapy goals.     OBJECTIVE IMPAIRMENTS: Abnormal gait, cardiopulmonary status limiting activity, decreased activity tolerance, decreased balance, decreased coordination, decreased endurance, decreased knowledge of use of DME, decreased mobility, difficulty walking, decreased ROM, decreased strength, impaired perceived functional ability, impaired flexibility, impaired UE functional use, improper body mechanics, postural dysfunction, obesity, and pain.   ACTIVITY LIMITATIONS: carrying, lifting, bending, sitting, standing, squatting, sleeping, stairs, transfers, bed mobility, bathing, toileting, dressing, reach over head, and caring for others  PARTICIPATION LIMITATIONS: meal prep, cleaning, laundry, interpersonal relationship, driving, shopping, community activity, and church  PERSONAL FACTORS: Age, Behavior pattern, Education, Fitness, Past/current experiences, Profession, Sex, Time since onset of injury/illness/exacerbation, and 3+ comorbidities: DM with diabetic neuropathy, stroke, HTN, AKI, UTI  are also affecting patient's functional outcome.   REHAB POTENTIAL: Fair length of time since CVA  CLINICAL DECISION MAKING: Evolving/moderate complexity  EVALUATION COMPLEXITY: Moderate  PLAN:  PT FREQUENCY: 2x/week  PT DURATION: 12 weeks  PLANNED INTERVENTIONS: Therapeutic exercises, Therapeutic activity, Neuromuscular re-education, Balance training, Gait training, Patient/Family education, Self Care, Joint  mobilization, Stair training, Vestibular training, Canalith repositioning, Visual/preceptual remediation/compensation, Orthotic/Fit training, DME instructions, Cognitive remediation, Electrical stimulation, Wheelchair mobility training, Spinal mobilization, Cryotherapy, Moist heat, Splintting, Taping, Traction, Ultrasound,  Manual therapy, and Re-evaluation  PLAN FOR NEXT SESSION:   Advanced home exercise program for preparation for discharge.  Plan for discharge in week of December 6  Norman Herrlich PT  Physical Therapist- [facility]  [facility]   10:09 AM 08/25/23

## 2023-08-25 ENCOUNTER — Ambulatory Visit: Payer: 59 | Admitting: Physical Therapy

## 2023-08-25 DIAGNOSIS — R2681 Unsteadiness on feet: Secondary | ICD-10-CM

## 2023-08-25 DIAGNOSIS — R262 Difficulty in walking, not elsewhere classified: Secondary | ICD-10-CM | POA: Diagnosis not present

## 2023-08-25 DIAGNOSIS — M6281 Muscle weakness (generalized): Secondary | ICD-10-CM

## 2023-08-30 ENCOUNTER — Ambulatory Visit: Payer: 59

## 2023-08-30 DIAGNOSIS — M6281 Muscle weakness (generalized): Secondary | ICD-10-CM

## 2023-08-30 DIAGNOSIS — R2681 Unsteadiness on feet: Secondary | ICD-10-CM

## 2023-08-30 DIAGNOSIS — R278 Other lack of coordination: Secondary | ICD-10-CM

## 2023-08-30 DIAGNOSIS — R262 Difficulty in walking, not elsewhere classified: Secondary | ICD-10-CM

## 2023-08-30 NOTE — Therapy (Signed)
OUTPATIENT PHYSICAL THERAPY TREATMENT / Physical Therapy Progress Note   Dates of reporting period  07/19/2023   to   08/30/2023    Patient Name: Dana Lucas MRN: [id number] DOB:July 01, 1951, 72 y.o., female Today's Date: 08/30/2023  PCP: Bobbye Morton MD REFERRING PROVIDER: Louis Matte MD   END OF SESSION:  PT End of Session - 08/30/23 0848     Visit Number 60    Number of Visits 60    Date for PT Re-Evaluation 09/11/23    Authorization Type UHC Medicare; [place] Medicaid    Progress Note Due on Visit 60    PT Start Time 0849    PT Stop Time 0924    PT Time Calculation (min) 35 min    Equipment Utilized During Treatment Gait belt    Activity Tolerance Patient tolerated treatment well;Patient limited by fatigue    Behavior During Therapy WFL for tasks assessed/performed                                   Past Medical History:  Diagnosis Date   Diabetes mellitus without complication (HCC)    Hypertension    Stroke [facility])    Past Surgical History:  Procedure Laterality Date   ABDOMINAL HYSTERECTOMY     COLONOSCOPY N/A 10/06/2019   Procedure: COLONOSCOPY;  Surgeon: Toledo, Boykin Nearing, MD;  Location: [facility] ENDOSCOPY;  Service: Gastroenterology;  Laterality: N/A;   Patient Active Problem List   Diagnosis Date Noted   Hypertension    Diabetes mellitus without complication (HCC)    Stroke (HCC)    GIB (gastrointestinal bleeding)    AKI (acute kidney injury) (HCC)    UTI (urinary tract infection)     ONSET DATE: 2020  REFERRING DIAG: 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   THERAPY DIAG:  Muscle weakness (generalized)  Difficulty in walking, not elsewhere classified  Unsteadiness on feet  Other lack of coordination  Rationale for Evaluation and Treatment: Rehabilitation  SUBJECTIVE:                                                                                                                                                                                              SUBJECTIVE STATEMENT:    Pt reports things are going well. Pt with continued R hands pain (chronic issue), reports only thing that helps is Tylenol. She reports pain in R hand remains high, described as an ache. Pt reports no falls/stumbles.    Pt accompanied by: self  PERTINENT HISTORY:  Patient presents for 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 Stroke was four years ago, has had multiple strokes. Had a little bit of therapy, afterwards in [place]. Moved up to [place] about three years ago. [name] includes DM with diabetic neuropathy, stroke, HTN, AKI, UTI. Uses a walker to walk to the bathroom (two wheel and three wheel walkers).  Has a power chair for out of the house.   PAIN:  Are you having pain? Yes Rt hand, Rt knee 5/10   PRECAUTIONS: Fall  WEIGHT BEARING RESTRICTIONS: No  FALLS: Has patient fallen in last 6 months? No  LIVING ENVIRONMENT: Lives with: lives with their family Lives in: House/apartment Stairs:  ramp outside Has following equipment at home: [name] - 2 wheeled, Environmental consultant - 4 wheeled, Wheelchair (power), shower chair, Ramped entry, and chair lift and hospital bed   PLOF: Independent  PATIENT GOALS: get up and walk by herself, wants to walk with a cane.   OBJECTIVE:   TODAY'S TREATMENT: DATE: 08/30/23   TA: : 0.21 m/s with RW Review of results and instruction provided in testing  TE STS 2x10, 1x5 with pad in chair. Rates easy-medium, does fatigue  LAQ seated 1 x 12 reps each LE --repeats with 2# aw RLE 1x12, no aw LLE 1x12   Soccer ball kicks LLE only  to promote L quad activation/power x multiple reps, tactile cuing provided  Standing march  x 10 with UE support each LE  Seated adductor squeeze with pball 3x12 cuing for 1-3 sec hold/rep  Session ended early due to fatigue/to prevent excessive fatigue    PATIENT  EDUCATION: Education details: Pt educated throughout session about proper posture and technique with exercises. Improved exercise technique, movement at target joints, use of target muscles after min to mod verbal, visual, tactile cues. Goals    HOME EXERCISE PROGRAM: continue HEP as previously given Access Code: [email or web address] URL: [email or web address] Date: 06/09/2023 Prepared by: Thresa Ross  Exercises - Leg Extension  - 1 x daily - 7 x weekly - 2 sets - 10 reps - 5 hold - Seated March  - 1 x daily - 7 x weekly - 2 sets - 10 reps - 5 hold - Seated Hip Abduction  - 1 x daily - 7 x weekly - 2 sets - 10 reps - 5 hold - Seated Hip Adduction Isometrics with Ball  - 1 x daily - 7 x weekly - 2 sets - 10 reps - 5 hold - Sit to Stand with Armchair  - 1 x daily - 7 x weekly - 2 sets - 5 reps - Side Stepping with Counter Support  - 1 x daily - 7 x weekly - 2 sets - 10 reps  GOALS: Goals reviewed with patient? Yes  SHORT TERM GOALS: Target date: 06/29/2023    Patient will be independent in home exercise program to improve strength/mobility for better functional independence with ADLs. Baseline: 01/26/2023: pt has some difficulty with HEP 06/09/23: completing 3 x per week ( on days not in therapy) Goal status: MET    LONG TERM GOALS: Target date: 08/17/2023    Patient will increase FOTO score to equal to or greater than 53    to demonstrate statistically significant improvement in mobility and quality of life.  Baseline: 3/5: 46%; 01/26/23: 45% ; 03/01/23: 42% 04/28/23: 52.5  8/22 54 9/6:53 Goal status: MET  2.  Patient (> 67 years old) will complete five times sit to stand test in <  15 seconds indicating an increased LE strength and improved balance. Baseline: 3/5: 38 seconds with SUE support; blocking of LLE; 01/26/23 18 seconds with pulling to stand; 03/01/23: 17 seconds pull to stand  04/28/23. 21.27(average of 2 trials) BUE on RW and CGA from PT  8/21 17.4sec with BUE  support on RW and CGA from PT   06/08/20: 15.94 secB UE support, mostly completes with the R LE, does not get full erect posture each rep  11/20: Good erect posture each rep, 20 sec Goal status: IN PROGRESS  3.  Patient will increase 10 meter walk test to >1.15m/s as to improve gait speed for better community ambulation and to reduce fall risk Baseline: 3/5: 1 min 43 seconds with RW and w/c follow; 01/26/2023: 2 min 6 seconds; 03/01/23: 1 min 11 sec, 0.14 m/s with WC follow and 2WW. 04/28/23: 56 sec (average of 2 trials) With 2WW.   8/22: 54.3 sec (average of 3 trials) With 2WW.  9/6:70 sec, 69 sec, 70 sec ( average of 70 sec) 10/16:44.62 sec 11/27: 0.21 m/s with RW  Goal status: IN PROGRESS  4.  Patient will increase BLE gross strength to 4/5 as to improve functional strength for independent gait, increased standing tolerance and increased ADL ability. Baseline: 3/5: grossly 2/5 LLE; 4//25/24: RLE grossly 4-/5, LLE grossly 2/5; 03/01/23 RLE grossly 4+/5 and LLE grossly 3+/5, exception 0/5 PF/DF (overall gross Bilat LE strength around 4-/5) 7/26: RLE 4+/5 except 5/5 knee extension. LLE: 4-/5 knee extension, hip adduction 4+/5, all others 3+/5 except ankle DF 2/5 and PF 2-/5  8/22: LLE: hip flexion: 3+,hip adduction 4+, abduction 4, knee extension 4-(delayed activation), knee flexion 4-/5 delayed activation. DF/PF: 1  10/16:RLE 4+/5 globally LLE: 3+ for knee flexion, 4- for hip flexion and knee extension, 4/5 for hip ABD/ ADD,  Goal status: PARTIALLY MET  5.  Patient will increase 2 min walk test by >7ft to indicate improved access to community and safety with gait through house.  Baseline: 37ft with RW, AFO, and CGA from PT 9/6:65 ft with RW  10/16: 85 ft with RW  11/20: 79 ft  with RW  Goal status: ONGOING  ASSESSMENT:  CLINICAL IMPRESSION:  Continued plan of care as laid out in recent treatment sessions. Pt with similar, but slight decrease in performance compared to prior  assessment. However, pt able to increase volume of STS completed today and reps of quad-focused LE therex. Pt will continue to benefit from skilled physical therapy intervention to address impairments, improve QOL, and attain therapy goals.     OBJECTIVE IMPAIRMENTS: Abnormal gait, cardiopulmonary status limiting activity, decreased activity tolerance, decreased balance, decreased coordination, decreased endurance, decreased knowledge of use of DME, decreased mobility, difficulty walking, decreased ROM, decreased strength, impaired perceived functional ability, impaired flexibility, impaired UE functional use, improper body mechanics, postural dysfunction, obesity, and pain.   ACTIVITY LIMITATIONS: carrying, lifting, bending, sitting, standing, squatting, sleeping, stairs, transfers, bed mobility, bathing, toileting, dressing, reach over head, and caring for others  PARTICIPATION LIMITATIONS: meal prep, cleaning, laundry, interpersonal relationship, driving, shopping, community activity, and church  PERSONAL FACTORS: Age, Behavior pattern, Education, Fitness, Past/current experiences, Profession, Sex, Time since onset of injury/illness/exacerbation, and 3+ comorbidities: DM with diabetic neuropathy, stroke, HTN, AKI, UTI  are also affecting patient's functional outcome.   REHAB POTENTIAL: Fair length of time since CVA  CLINICAL DECISION MAKING: Evolving/moderate complexity  EVALUATION COMPLEXITY: Moderate  PLAN:  PT FREQUENCY: 2x/week  PT DURATION: 12 weeks  PLANNED  INTERVENTIONS: Therapeutic exercises, Therapeutic activity, Neuromuscular re-education, Balance training, Gait training, Patient/Family education, Self Care, Joint mobilization, Stair training, Vestibular training, Canalith repositioning, Visual/preceptual remediation/compensation, Orthotic/Fit training, DME instructions, Cognitive remediation, Electrical stimulation, Wheelchair mobility training, Spinal mobilization, Cryotherapy,  Moist heat, Splintting, Taping, Traction, Ultrasound, Manual therapy, and Re-evaluation  PLAN FOR NEXT SESSION:   Advanced home exercise program for preparation for discharge.  Plan for discharge in week of December 6  Baird Kay PT  Physical Therapist- [facility]  [facility]   9:29 AM 08/30/23

## 2023-09-06 ENCOUNTER — Ambulatory Visit: Payer: 59 | Attending: Internal Medicine | Admitting: Physical Therapy

## 2023-09-06 DIAGNOSIS — R2681 Unsteadiness on feet: Secondary | ICD-10-CM | POA: Insufficient documentation

## 2023-09-06 DIAGNOSIS — R262 Difficulty in walking, not elsewhere classified: Secondary | ICD-10-CM | POA: Insufficient documentation

## 2023-09-06 DIAGNOSIS — R278 Other lack of coordination: Secondary | ICD-10-CM | POA: Diagnosis present

## 2023-09-06 DIAGNOSIS — M6281 Muscle weakness (generalized): Secondary | ICD-10-CM | POA: Insufficient documentation

## 2023-09-06 NOTE — Therapy (Signed)
OUTPATIENT PHYSICAL THERAPY TREATMENT     Patient Name: Dalaney Feltus MRN: [id number] DOB:21-Mar-1951, 72 y.o., female Today's Date: 09/06/2023  PCP: Bobbye Morton MD REFERRING PROVIDER: Louis Matte MD   END OF SESSION:  PT End of Session - 09/06/23 0858     Visit Number 61    Number of Visits 63    Date for PT Re-Evaluation 09/11/23    Authorization Type UHC Medicare; [place] Medicaid    Progress Note Due on Visit 70    PT Start Time 0931    PT Stop Time 1014    PT Time Calculation (min) 43 min    Equipment Utilized During Treatment Gait belt    Activity Tolerance Patient tolerated treatment well;Patient limited by fatigue    Behavior During Therapy WFL for tasks assessed/performed                                   Past Medical History:  Diagnosis Date   Diabetes mellitus without complication (HCC)    Hypertension    Stroke [facility])    Past Surgical History:  Procedure Laterality Date   ABDOMINAL HYSTERECTOMY     COLONOSCOPY N/A 10/06/2019   Procedure: COLONOSCOPY;  Surgeon: Toledo, Boykin Nearing, MD;  Location: [facility] ENDOSCOPY;  Service: Gastroenterology;  Laterality: N/A;   Patient Active Problem List   Diagnosis Date Noted   Hypertension    Diabetes mellitus without complication (HCC)    Stroke (HCC)    GIB (gastrointestinal bleeding)    AKI (acute kidney injury) (HCC)    UTI (urinary tract infection)     ONSET DATE: 2020  REFERRING DIAG: 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   THERAPY DIAG:  Muscle weakness (generalized)  Difficulty in walking, not elsewhere classified  Unsteadiness on feet  Rationale for Evaluation and Treatment: Rehabilitation  SUBJECTIVE:                                                                                                                                                                                             SUBJECTIVE STATEMENT:    Pt doing well with no  changes since last session. Pt is aware this is her last week of PT.    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 Stroke was four years ago, has had multiple strokes. Had a little bit of therapy, afterwards in [place]. Moved up to [place] about three years ago. [name]  includes DM with diabetic neuropathy, stroke, HTN, AKI, UTI. Uses a walker to walk to the bathroom (two wheel and three wheel walkers).  Has a power chair for out of the house.   PAIN:  Are you having pain? Yes Rt hand, Rt knee 5/10   PRECAUTIONS: Fall  WEIGHT BEARING RESTRICTIONS: No  FALLS: Has patient fallen in last 6 months? No  LIVING ENVIRONMENT: Lives with: lives with their family Lives in: House/apartment Stairs:  ramp outside Has following equipment at home: [name] - 2 wheeled, Environmental consultant - 4 wheeled, Wheelchair (power), shower chair, Ramped entry, and chair lift and hospital bed   PLOF: Independent  PATIENT GOALS: get up and walk by herself, wants to walk with a cane.   OBJECTIVE:   TODAY'S TREATMENT: DATE: 09/06/23   TE  Transfer to nustep B UE/LE x 6 min   STS 3 x 6 with no UE assist  -cues for foot placement on the left top optimize LLE workload - March with support and with ankle weights   -  - 2 sets - 10 reps - Standing Quad Set  -- 2 sets - 10 reps - Standing Hip Abduction with Counter Support  - - 2 sets - 10 reps - Seated Hip Adduction with Pillow  - 2 sets - 10 reps - 3 second  hold      PATIENT EDUCATION: Education details: Pt educated throughout session about proper posture and technique with exercises. Improved exercise technique, movement at target joints, use of target muscles after min to mod verbal, visual, tactile cues. Goals    HOME EXERCISE PROGRAM: continue HEP as previously given Access Code: [email or web address] URL: [email or web address] Date: 09/06/2023 Prepared  by: Thresa Ross  Exercises - Sit to stand no UE support - 1 x daily - 7 x weekly - 3 sets - 6 reps - March with support and with ankle weights   - 1 x daily - 7 x weekly - 2 sets - 10 reps - Standing Quad Set  - 1 x daily - 7 x weekly - 2 sets - 10 reps - Standing Hip Abduction with Counter Support  - 1 x daily - 7 x weekly - 2 sets - 10 reps - Seated Hip Adduction with Pillow  - 1 x daily - 7 x weekly - 2 sets - 10 reps - 3 second  hold  GOALS: Goals reviewed with patient? Yes  SHORT TERM GOALS: Target date: 06/29/2023    Patient will be independent in home exercise program to improve strength/mobility for better functional independence with ADLs. Baseline: 01/26/2023: pt has some difficulty with HEP 06/09/23: completing 3 x per week ( on days not in therapy) Goal status: MET    LONG TERM GOALS: Target date: 08/17/2023    Patient will increase FOTO score to equal to or greater than 53    to demonstrate statistically significant improvement in mobility and quality of life.  Baseline: 3/5: 46%; 01/26/23: 45% ; 03/01/23: 42% 04/28/23: 52.5  8/22 54 9/6:53 Goal status: MET  2.  Patient (> 65 years old) will complete five times sit to stand test in < 15 seconds indicating an increased LE strength and improved balance. Baseline: 3/5: 38 seconds with SUE support; blocking of LLE; 01/26/23 18 seconds with pulling to stand; 03/01/23: 17 seconds pull to stand  04/28/23. 21.27(average of 2 trials) BUE on RW and CGA from PT  8/21 17.4sec with BUE support on RW and CGA from PT   06/08/20:  15.94 secB UE support, mostly completes with the R LE, does not get full erect posture each rep  11/20: Good erect posture each rep, 20 sec Goal status: IN PROGRESS  3.  Patient will increase 10 meter walk test to >1.82m/s as to improve gait speed for better community ambulation and to reduce fall risk Baseline: 3/5: 1 min 43 seconds with RW and w/c follow; 01/26/2023: 2 min 6 seconds; 03/01/23: 1 min 11 sec,  0.14 m/s with WC follow and 2WW. 04/28/23: 56 sec (average of 2 trials) With 2WW.   8/22: 54.3 sec (average of 3 trials) With 2WW.  9/6:70 sec, 69 sec, 70 sec ( average of 70 sec) 10/16:44.62 sec 11/27: 0.21 m/s with RW  Goal status: IN PROGRESS  4.  Patient will increase BLE gross strength to 4/5 as to improve functional strength for independent gait, increased standing tolerance and increased ADL ability. Baseline: 3/5: grossly 2/5 LLE; 4//25/24: RLE grossly 4-/5, LLE grossly 2/5; 03/01/23 RLE grossly 4+/5 and LLE grossly 3+/5, exception 0/5 PF/DF (overall gross Bilat LE strength around 4-/5) 7/26: RLE 4+/5 except 5/5 knee extension. LLE: 4-/5 knee extension, hip adduction 4+/5, all others 3+/5 except ankle DF 2/5 and PF 2-/5  8/22: LLE: hip flexion: 3+,hip adduction 4+, abduction 4, knee extension 4-(delayed activation), knee flexion 4-/5 delayed activation. DF/PF: 1  10/16:RLE 4+/5 globally LLE: 3+ for knee flexion, 4- for hip flexion and knee extension, 4/5 for hip ABD/ ADD,  Goal status: PARTIALLY MET  5.  Patient will increase 2 min walk test by >18ft to indicate improved access to community and safety with gait through house.  Baseline: 15ft with RW, AFO, and CGA from PT 9/6:65 ft with RW  10/16: 85 ft with RW  11/20: 79 ft  with RW  Goal status: ONGOING  ASSESSMENT:  CLINICAL IMPRESSION:   Patient presents with good motivation for completion of physical therapy activities.  Patient progresses with her home exercise program and a final updated home exercise program copy was provided to patient and all exercises were reviewed.  Patient still requires cues for proper positioning of her left lower extremity for optimization of left lower extremity muscle recruitment during sit to stands. Pt will continue to benefit from skilled physical therapy intervention to address impairments, improve QOL, and attain therapy goals.      OBJECTIVE IMPAIRMENTS: Abnormal gait, cardiopulmonary  status limiting activity, decreased activity tolerance, decreased balance, decreased coordination, decreased endurance, decreased knowledge of use of DME, decreased mobility, difficulty walking, decreased ROM, decreased strength, impaired perceived functional ability, impaired flexibility, impaired UE functional use, improper body mechanics, postural dysfunction, obesity, and pain.   ACTIVITY LIMITATIONS: carrying, lifting, bending, sitting, standing, squatting, sleeping, stairs, transfers, bed mobility, bathing, toileting, dressing, reach over head, and caring for others  PARTICIPATION LIMITATIONS: meal prep, cleaning, laundry, interpersonal relationship, driving, shopping, community activity, and church  PERSONAL FACTORS: Age, Behavior pattern, Education, Fitness, Past/current experiences, Profession, Sex, Time since onset of injury/illness/exacerbation, and 3+ comorbidities: DM with diabetic neuropathy, stroke, HTN, AKI, UTI  are also affecting patient's functional outcome.   REHAB POTENTIAL: Fair length of time since CVA  CLINICAL DECISION MAKING: Evolving/moderate complexity  EVALUATION COMPLEXITY: Moderate  PLAN:  PT FREQUENCY: 2x/week  PT DURATION: 12 weeks  PLANNED INTERVENTIONS: Therapeutic exercises, Therapeutic activity, Neuromuscular re-education, Balance training, Gait training, Patient/Family education, Self Care, Joint mobilization, Stair training, Vestibular training, Canalith repositioning, Visual/preceptual remediation/compensation, Orthotic/Fit training, DME instructions, Cognitive remediation, Electrical stimulation, Wheelchair mobility training, Spinal mobilization,  Cryotherapy, Moist heat, Splintting, Taping, Traction, Ultrasound, Manual therapy, and Re-evaluation  PLAN FOR NEXT SESSION:   Advanced home exercise program for preparation for discharge.  Plan for discharge in week of December 6  Norman Herrlich PT  Physical Therapist- [facility]  [facility]   9:00 AM 09/06/23

## 2023-09-07 ENCOUNTER — Encounter: Payer: Self-pay | Admitting: Dietician

## 2023-09-07 ENCOUNTER — Encounter: Payer: 59 | Attending: Internal Medicine | Admitting: Dietician

## 2023-09-07 DIAGNOSIS — Z794 Long term (current) use of insulin: Secondary | ICD-10-CM | POA: Insufficient documentation

## 2023-09-07 DIAGNOSIS — Z713 Dietary counseling and surveillance: Secondary | ICD-10-CM | POA: Diagnosis not present

## 2023-09-07 DIAGNOSIS — E119 Type 2 diabetes mellitus without complications: Secondary | ICD-10-CM

## 2023-09-07 DIAGNOSIS — E1169 Type 2 diabetes mellitus with other specified complication: Secondary | ICD-10-CM | POA: Diagnosis present

## 2023-09-07 DIAGNOSIS — Z7984 Long term (current) use of oral hypoglycemic drugs: Secondary | ICD-10-CM | POA: Insufficient documentation

## 2023-09-07 NOTE — Patient Instructions (Addendum)
When having breakfast, have about 1-1.5 cups of cereal, or only 1 pack of oatmeal. Add an egg with some cheese in place of the extra cereal/oatmeal.  When having a Sprite, always choose Sprite ZERO!! Try different flavors of sparkling water to see what you like!!  When choosing frozen foods, look for lower sodium and lower fat options as often as possible. Look for brands like Healthy Choice, Smart Ones, and [employer].  Choose Mrs. Dash to season your food!!  Check your blood sugar each morning before eating or drinking (fasting). Look for numbers under 130 mg/dL Check your blood sugar in the evening before bed. Look for numbers under 180 mg/dL.  Your goal A1c is below 8.0%

## 2023-09-07 NOTE — Progress Notes (Signed)
Diabetes Self-Management Education  Visit Type: First/Initial  Appt. Start Time: 0900 Appt. End Time: 1020  09/07/2023  Ms. Dana Lucas, identified by name and date of birth, is a 72 y.o. female with a diagnosis of Diabetes: Type 2.   ASSESSMENT  There were no vitals taken for this visit. There is no height or weight on file to calculate BMI.  Pt states they would like to improve their diet r/t multiple health concerns (DM, CKD S4, CVA) Pt reports taking Jardiance and Lantus @30u  Bid for their DM, no side effects/hypoglycemia reported. Pt reports checking BG BID, before taking Lantus @30u  in the morning and evening. Pt reports taking PT 2x a week for rehabilitation of hemiparesis (L side) s/p CVA (x4), reports it's not going as well as they are hoping, states their L leg muscles aren't doing what they want them to, states their last session will be tomorrow d/t insurance. Pt reports onychomycosis R foot, sees podiatrist for nail trims. Pt reports drinking a lot of ZERO sugar drinks (Teas), decaffeinated coffee in the morning, 4-5 bottles of plain water daily. Pt reports lactose intolerance, can tolerate Lactaid milk and dairy if taking Lactase pill.    Diabetes Self-Management Education - 09/07/23 0919       Visit Information   Visit Type First/Initial      Initial Visit   Diabetes Type Type 2    Date Diagnosed 38 years ago    Are you currently following a meal plan? No    Are you taking your medications as prescribed? Yes      Health Coping   How would you rate your overall health? Fair      Psychosocial Assessment   Patient Belief/Attitude about Diabetes Motivated to manage diabetes    What is the hardest part about your diabetes right now, causing you the most concern, or is the most worrisome to you about your diabetes?   Making healty food and beverage choices    Self-care barriers Debilitated state due to current medical condition   L Hemiparesis s/p multiple CVA    Self-management support Doctor's office    Other persons present Patient    Patient Concerns Nutrition/Meal planning    Special Needs None    Preferred Learning Style No preference indicated    Learning Readiness Ready    How often do you need to have someone help you when you read instructions, pamphlets, or other written materials from your doctor or pharmacy? 2 - Rarely    What is the last grade level you completed in school? 10th grade      Pre-Education Assessment   Patient understands the diabetes disease and treatment process. Needs Instruction    Patient understands incorporating nutritional management into lifestyle. Needs Instruction    Patient undertands incorporating physical activity into lifestyle. Needs Instruction    Patient understands using medications safely. Needs Instruction    Patient understands monitoring blood glucose, interpreting and using results Needs Instruction    Patient understands prevention, detection, and treatment of acute complications. Needs Instruction    Patient understands prevention, detection, and treatment of chronic complications. Needs Instruction    Patient understands how to develop strategies to address psychosocial issues. Needs Instruction    Patient understands how to develop strategies to promote health/change behavior. Needs Instruction      Complications   Last HgB A1C per patient/outside source 7.6 %   02/08/2023   How often do you check your blood sugar? 1-2 times/day  Fasting Blood glucose range (mg/dL) 161-096    Postprandial Blood glucose range (mg/dL) 045-409;811-914    Number of hypoglycemic episodes per month 0    Have you had a dilated eye exam in the past 12 months? Yes    Have you had a dental exam in the past 12 months? No    Are you checking your feet? Yes    How many days per week are you checking your feet? 7      Dietary Intake   Breakfast Honey Nut Cheerios, Lactaid milk 1%, decaf coffee w/ splenda    Lunch  Meatloaf, mashed potatoes, spinach, Sprite    Dinner Salmon, potaotes, unsweet tea with Splenda (16-24 oz)    Snack (evening) Apple    Beverage(s) Water, Sprite, Unsweet tea, coffee      Activity / Exercise   Activity / Exercise Type ADL's   Physical therapy   How many days per week do you exercise? 2    How many minutes per day do you exercise? 45    Total minutes per week of exercise 90      Patient Education   Previous Diabetes Education No    Disease Pathophysiology Explored patient's options for treatment of their diabetes    Healthy Eating Role of diet in the treatment of diabetes and the relationship between the three main macronutrients and blood glucose level;Food label reading, portion sizes and measuring food.    Being Active Other (comment)   Physical Therapy r/t hemiparesis   Medications Taught/reviewed insulin/injectables, injection, site rotation, insulin/injectables storage and needle disposal.;Reviewed patients medication for diabetes, action, purpose, timing of dose and side effects.    Monitoring Identified appropriate SMBG and/or A1C goals.    Acute complications Taught prevention, symptoms, and  treatment of hypoglycemia - the 15 rule.;Discussed and identified patients' prevention, symptoms, and treatment of hyperglycemia.    Chronic complications Nephropathy, what it is, prevention of, the use of ACE, ARB's and early detection of through urine microalbumia.;Relationship between chronic complications and blood glucose control;Lipid levels, blood glucose control and heart disease      Individualized Goals (developed by patient)   Nutrition Follow meal plan discussed    Medications take my medication as prescribed    Monitoring  Test my blood glucose as discussed    Reducing Risk examine blood glucose patterns;do foot checks daily      Post-Education Assessment   Patient understands the diabetes disease and treatment process. Needs Review    Patient understands  incorporating nutritional management into lifestyle. Needs Review    Patient undertands incorporating physical activity into lifestyle. Needs Review    Patient understands using medications safely. Needs Review    Patient understands monitoring blood glucose, interpreting and using results Needs Review    Patient understands prevention, detection, and treatment of acute complications. Needs Review    Patient understands prevention, detection, and treatment of chronic complications. Needs Review    Patient understands how to develop strategies to address psychosocial issues. Needs Review    Patient understands how to develop strategies to promote health/change behavior. Needs Review      Outcomes   Expected Outcomes Demonstrated interest in learning but significant barriers to change   Hemiparesis, Low SES   Future DMSE 2 months    Program Status Not Completed             Individualized Plan for Diabetes Self-Management Training:   Learning Objective:  Patient will have a greater understanding of diabetes  self-management. Patient education plan is to attend individual and/or group sessions per assessed needs and concerns.   Plan:   Patient Instructions  When having breakfast, have about 1-1.5 cups of cereal, or only 1 pack of oatmeal. Add an egg with some cheese in place of the extra cereal/oatmeal.  When having a Sprite, always choose Sprite ZERO!! Try different flavors of sparkling water to see what you like!!  When choosing frozen foods, look for lower sodium and lower fat options as often as possible. Look for brands like Healthy Choice, Smart Ones, and WellPoint.  Choose Mrs. Dash to season your food!!  Check your blood sugar each morning before eating or drinking (fasting). Look for numbers under 130 mg/dL Check your blood sugar in the evening before bed. Look for numbers under 180 mg/dL.  Your goal A1c is below 8.0%    Expected Outcomes:  Demonstrated interest in  learning but significant barriers to change (Hemiparesis, Low SES)  Education material provided: Food label handouts and My Plate, Food Assistance Resources, Carb/Protein/Non-Starchy Vegetable food lists  If problems or questions, patient to contact team via:  Phone and Email  Future DSME appointment: 2 months

## 2023-09-08 ENCOUNTER — Encounter: Payer: Self-pay | Admitting: Physical Therapy

## 2023-09-08 ENCOUNTER — Ambulatory Visit: Payer: 59 | Admitting: Physical Therapy

## 2023-09-08 DIAGNOSIS — R278 Other lack of coordination: Secondary | ICD-10-CM

## 2023-09-08 DIAGNOSIS — M6281 Muscle weakness (generalized): Secondary | ICD-10-CM

## 2023-09-08 DIAGNOSIS — R2681 Unsteadiness on feet: Secondary | ICD-10-CM

## 2023-09-08 DIAGNOSIS — R262 Difficulty in walking, not elsewhere classified: Secondary | ICD-10-CM

## 2023-09-08 NOTE — Therapy (Signed)
OUTPATIENT PHYSICAL THERAPY TREATMENT / DISCHARGE THERAPY    Patient Name: Dana Lucas MRN: [id number] DOB:1951-08-19, 72 y.o., female Today's Date: 09/08/2023  PCP: Bobbye Morton MD REFERRING PROVIDER: Louis Matte MD   END OF SESSION:  PT End of Session - 09/08/23 0934     Visit Number 62    Number of Visits 63    Date for PT Re-Evaluation 09/11/23    Authorization Type UHC Medicare; [place] Medicaid    Authorization Time Period 03/01/23-05/24/23    Progress Note Due on Visit 70    PT Start Time 0933    PT Stop Time 1013    PT Time Calculation (min) 40 min    Equipment Utilized During Treatment Gait belt    Activity Tolerance Patient tolerated treatment well;Patient limited by fatigue    Behavior During Therapy WFL for tasks assessed/performed                                   Past Medical History:  Diagnosis Date   Diabetes mellitus without complication (HCC)    Hypertension    Stroke [facility])    Past Surgical History:  Procedure Laterality Date   ABDOMINAL HYSTERECTOMY     COLONOSCOPY N/A 10/06/2019   Procedure: COLONOSCOPY;  Surgeon: Toledo, Boykin Nearing, MD;  Location: [facility] ENDOSCOPY;  Service: Gastroenterology;  Laterality: N/A;   Patient Active Problem List   Diagnosis Date Noted   Hypertension    Diabetes mellitus without complication (HCC)    Stroke (HCC)    GIB (gastrointestinal bleeding)    AKI (acute kidney injury) (HCC)    UTI (urinary tract infection)     ONSET DATE: 2020  REFERRING DIAG: 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   THERAPY DIAG:  Muscle weakness (generalized)  Difficulty in walking, not elsewhere classified  Unsteadiness on feet  Other lack of coordination  Rationale for Evaluation and Treatment: Rehabilitation  SUBJECTIVE:                                                                                                                                                                                              SUBJECTIVE STATEMENT:    Pt reported severe R knee pain today. Pt stated that pain kept her awake throughout the night last night. Pt instructed to go to MD to be assessed if pain does not cease.   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 Stroke  was four years ago, has had multiple strokes. Had a little bit of therapy, afterwards in [place]. Moved up to [place] about three years ago. [name] includes DM with diabetic neuropathy, stroke, HTN, AKI, UTI. Uses a walker to walk to the bathroom (two wheel and three wheel walkers).  Has a power chair for out of the house.   PAIN:  Are you having pain? Yes Rt hand, Rt knee 5/10   PRECAUTIONS: Fall  WEIGHT BEARING RESTRICTIONS: No  FALLS: Has patient fallen in last 6 months? No  LIVING ENVIRONMENT: Lives with: lives with their family Lives in: House/apartment Stairs:  ramp outside Has following equipment at home: [name] - 2 wheeled, Environmental consultant - 4 wheeled, Wheelchair (power), shower chair, Ramped entry, and chair lift and hospital bed   PLOF: Independent  PATIENT GOALS: get up and walk by herself, wants to walk with a cane.   OBJECTIVE:   TODAY'S TREATMENT: DATE: 09/08/23   Physical therapy treatment session today consisted of completing assessment of goals and administration of testing as demonstrated and documented in flow sheet, treatment, and goals section of this note. Addition treatments may be found below.   Seated LAQ 2x10 each LE Seated Hip Abductions GTB 2x10 Seated Hip Adductions with Pillow 2x10  Seated marches 2x10 each LE     PATIENT EDUCATION: Education details: Pt educated throughout session about proper posture and technique with exercises. Improved exercise technique, movement at target joints, use of target muscles after min to mod verbal, visual, tactile cues.  Goals    HOME EXERCISE PROGRAM: continue HEP as previously given Access Code: [email or web address] URL: [email or web address] Date: 09/06/2023 Prepared by: Thresa Ross  Exercises - Sit to stand no UE support - 1 x daily - 7 x weekly - 3 sets - 6 reps - March with support and with ankle weights   - 1 x daily - 7 x weekly - 2 sets - 10 reps - Standing Quad Set  - 1 x daily - 7 x weekly - 2 sets - 10 reps - Standing Hip Abduction with Counter Support  - 1 x daily - 7 x weekly - 2 sets - 10 reps - Seated Hip Adduction with Pillow  - 1 x daily - 7 x weekly - 2 sets - 10 reps - 3 second  hold  GOALS: Goals reviewed with patient? Yes  SHORT TERM GOALS: Target date: 06/29/2023    Patient will be independent in home exercise program to improve strength/mobility for better functional independence with ADLs. Baseline: 01/26/2023: pt has some difficulty with HEP 06/09/23: completing 3 x per week ( on days not in therapy) Goal status: MET    LONG TERM GOALS: Target date: 08/17/2023    Patient will increase FOTO score to equal to or greater than 53    to demonstrate statistically significant improvement in mobility and quality of life.  Baseline: 3/5: 46%; 01/26/23: 45% ; 03/01/23: 42% 04/28/23: 52.5  8/22 54 9/6:53 12/6: 44 Goal status: MET  2.  Patient (> 18 years old) will complete five times sit to stand test in < 15 seconds indicating an increased LE strength and improved balance. Baseline: 3/5: 38 seconds with SUE support; blocking of LLE; 01/26/23 18 seconds with pulling to stand; 03/01/23: 17 seconds pull to stand  04/28/23. 21.27(average of 2 trials) BUE on RW and CGA from PT  8/21 17.4sec with BUE support on RW and CGA from PT   06/08/20: 15.94 secB UE support, mostly completes with  the R LE, does not get full erect posture each rep  11/20: Good erect posture each rep, 20 sec 12/6: 1 minute, 13 seconds, difficulty keeping L foot still Goal status: NOT MET  3.  Patient will  increase 10 meter walk test to >1.68m/s as to improve gait speed for better community ambulation and to reduce fall risk Baseline: 3/5: 1 min 43 seconds with RW and w/c follow; 01/26/2023: 2 min 6 seconds; 03/01/23: 1 min 11 sec, 0.14 m/s with WC follow and 2WW. 04/28/23: 56 sec (average of 2 trials) With 2WW.   8/22: 54.3 sec (average of 3 trials) With 2WW.  9/6:70 sec, 69 sec, 70 sec ( average of 70 sec) 10/16:44.62 sec 11/27: 0.21 m/s with RW  12/6: 0.19 m/s with RW Goal status: NOT MET  4.  Patient will increase BLE gross strength to 4/5 as to improve functional strength for independent gait, increased standing tolerance and increased ADL ability. Baseline: 3/5: grossly 2/5 LLE; 4//25/24: RLE grossly 4-/5, LLE grossly 2/5; 03/01/23 RLE grossly 4+/5 and LLE grossly 3+/5, exception 0/5 PF/DF (overall gross Bilat LE strength around 4-/5) 7/26: RLE 4+/5 except 5/5 knee extension. LLE: 4-/5 knee extension, hip adduction 4+/5, all others 3+/5 except ankle DF 2/5 and PF 2-/5  8/22: LLE: hip flexion: 3+,hip adduction 4+, abduction 4, knee extension 4-(delayed activation), knee flexion 4-/5 delayed activation. DF/PF: 1  10/16:RLE 4+/5 globally LLE: 3+ for knee flexion, 4- for hip flexion and knee extension, 4/5 for hip ABD/ ADD,  Goal status: PARTIALLY MET  5.  Patient will increase 2 min walk test by >89ft to indicate improved access to community and safety with gait through house.  Baseline: 49ft with RW, AFO, and CGA from PT 9/6:65 ft with RW  10/16: 85 ft with RW  11/20: 79 ft  with RW  Goal status: PARTIALLY MET  ASSESSMENT:  CLINICAL IMPRESSION:   Today's discharge visit focused on assessment of goals in progress, as well as demonstrating capability of HEP. Pt reported severe R knee pain today that kept her up throughout the night, which affected her performance in 5xSTS, 10 MWT, and 2 minute walk test.  Due to the pt having severe knee pain, the pt was able to complete their HEP in sitting  today and was instructed to do HEP in sitting on days that she is experiencing severe pain. Pt instructed to see MD if pain does not improve. Pt reported being compliant with typical HEP when she is not experiencing knee pain and [facility] and maintain progress obtained through PT. Pt to be discharged from formal PT with all needs met, all questions answered, and with HEP in place.      OBJECTIVE IMPAIRMENTS: Abnormal gait, cardiopulmonary status limiting activity, decreased activity tolerance, decreased balance, decreased coordination, decreased endurance, decreased knowledge of use of DME, decreased mobility, difficulty walking, decreased ROM, decreased strength, impaired perceived functional ability, impaired flexibility, impaired UE functional use, improper body mechanics, postural dysfunction, obesity, and pain.   ACTIVITY LIMITATIONS: carrying, lifting, bending, sitting, standing, squatting, sleeping, stairs, transfers, bed mobility, bathing, toileting, dressing, reach over head, and caring for others  PARTICIPATION LIMITATIONS: meal prep, cleaning, laundry, interpersonal relationship, driving, shopping, community activity, and church  PERSONAL FACTORS: Age, Behavior pattern, Education, Fitness, Past/current experiences, Profession, Sex, Time since onset of injury/illness/exacerbation, and 3+ comorbidities: DM with diabetic neuropathy, stroke, HTN, AKI, UTI  are also affecting patient's functional outcome.  REHAB POTENTIAL: Fair length of time since CVA  CLINICAL DECISION MAKING: Evolving/moderate complexity  EVALUATION COMPLEXITY: Moderate  PLAN:  PT FREQUENCY: 2x/week  PT DURATION: 12 weeks  PLANNED INTERVENTIONS: Therapeutic exercises, Therapeutic activity, Neuromuscular re-education, Balance training, Gait training, Patient/Family education, Self Care, Joint mobilization, Stair training, Vestibular training, Canalith repositioning,  Visual/preceptual remediation/compensation, Orthotic/Fit training, DME instructions, Cognitive remediation, Electrical stimulation, Wheelchair mobility training, Spinal mobilization, Cryotherapy, Moist heat, Splintting, Taping, Traction, Ultrasound, Manual therapy, and Re-evaluation  PLAN FOR NEXT SESSION:   D/C this date   Debara Pickett, SPT  Norman Herrlich PT  Physical Therapist- [facility]  [facility]   10:18 AM 09/08/23

## 2023-09-13 ENCOUNTER — Ambulatory Visit: Payer: 59 | Admitting: Physical Therapy

## 2023-09-15 ENCOUNTER — Ambulatory Visit: Payer: 59 | Admitting: Physical Therapy

## 2023-09-20 ENCOUNTER — Ambulatory Visit: Payer: 59 | Admitting: Physical Therapy

## 2023-09-22 ENCOUNTER — Ambulatory Visit: Payer: 59 | Admitting: Physical Therapy

## 2023-09-29 ENCOUNTER — Ambulatory Visit: Payer: 59 | Admitting: Physical Therapy

## 2023-10-03 ENCOUNTER — Ambulatory Visit: Payer: 59 | Admitting: Physical Therapy

## 2023-10-06 ENCOUNTER — Ambulatory Visit: Payer: 59 | Admitting: Physical Therapy

## 2023-10-11 ENCOUNTER — Ambulatory Visit: Payer: 59 | Admitting: Physical Therapy

## 2023-10-13 ENCOUNTER — Ambulatory Visit: Payer: 59 | Admitting: Physical Therapy

## 2023-10-18 ENCOUNTER — Ambulatory Visit: Payer: 59 | Admitting: Physical Therapy

## 2023-10-20 ENCOUNTER — Ambulatory Visit: Payer: 59 | Admitting: Physical Therapy

## 2023-10-25 ENCOUNTER — Ambulatory Visit: Payer: 59 | Admitting: Physical Therapy

## 2023-10-27 ENCOUNTER — Ambulatory Visit: Payer: 59 | Admitting: Physical Therapy

## 2023-11-01 ENCOUNTER — Ambulatory Visit: Payer: 59 | Admitting: Physical Therapy

## 2023-11-03 ENCOUNTER — Ambulatory Visit: Payer: 59 | Admitting: Dietician

## 2023-11-03 ENCOUNTER — Ambulatory Visit: Payer: 59 | Admitting: Physical Therapy

## 2023-11-08 ENCOUNTER — Ambulatory Visit: Payer: 59 | Admitting: Physical Therapy

## 2023-11-10 ENCOUNTER — Ambulatory Visit: Payer: 59 | Admitting: Physical Therapy

## 2023-11-15 ENCOUNTER — Ambulatory Visit: Payer: 59 | Admitting: Physical Therapy

## 2023-11-16 ENCOUNTER — Ambulatory Visit: Payer: 59 | Admitting: Podiatry

## 2023-11-16 ENCOUNTER — Ambulatory Visit (INDEPENDENT_AMBULATORY_CARE_PROVIDER_SITE_OTHER): Payer: 59 | Admitting: Podiatry

## 2023-11-16 DIAGNOSIS — Z91199 Patient's noncompliance with other medical treatment and regimen due to unspecified reason: Secondary | ICD-10-CM

## 2023-11-17 ENCOUNTER — Ambulatory Visit: Payer: 59 | Admitting: Physical Therapy

## 2023-11-22 ENCOUNTER — Ambulatory Visit: Payer: 59 | Admitting: Physical Therapy

## 2023-11-22 NOTE — Progress Notes (Signed)
 1. No-show for appointment

## 2023-11-24 ENCOUNTER — Ambulatory Visit: Payer: 59 | Admitting: Physical Therapy

## 2023-11-29 ENCOUNTER — Ambulatory Visit: Payer: 59 | Admitting: Physical Therapy

## 2023-12-01 ENCOUNTER — Ambulatory Visit: Payer: 59 | Admitting: Physical Therapy

## 2023-12-06 ENCOUNTER — Ambulatory Visit: Payer: 59 | Admitting: Physical Therapy

## 2023-12-08 ENCOUNTER — Ambulatory Visit: Payer: 59 | Admitting: Physical Therapy

## 2023-12-13 ENCOUNTER — Ambulatory Visit: Payer: 59 | Admitting: Physical Therapy

## 2023-12-15 ENCOUNTER — Ambulatory Visit: Payer: 59 | Admitting: Physical Therapy

## 2023-12-20 ENCOUNTER — Ambulatory Visit: Payer: 59 | Admitting: Physical Therapy

## 2024-04-29 ENCOUNTER — Encounter: Payer: Self-pay | Admitting: Podiatry

## 2024-04-29 ENCOUNTER — Ambulatory Visit (INDEPENDENT_AMBULATORY_CARE_PROVIDER_SITE_OTHER): Admitting: Podiatry

## 2024-04-29 DIAGNOSIS — M2142 Flat foot [pes planus] (acquired), left foot: Secondary | ICD-10-CM | POA: Diagnosis not present

## 2024-04-29 DIAGNOSIS — B351 Tinea unguium: Secondary | ICD-10-CM | POA: Diagnosis not present

## 2024-04-29 DIAGNOSIS — M79675 Pain in left toe(s): Secondary | ICD-10-CM

## 2024-04-29 DIAGNOSIS — M2141 Flat foot [pes planus] (acquired), right foot: Secondary | ICD-10-CM | POA: Diagnosis not present

## 2024-04-29 DIAGNOSIS — M79674 Pain in right toe(s): Secondary | ICD-10-CM

## 2024-04-29 DIAGNOSIS — E119 Type 2 diabetes mellitus without complications: Secondary | ICD-10-CM | POA: Diagnosis not present

## 2024-05-02 ENCOUNTER — Encounter: Payer: Self-pay | Admitting: Podiatry

## 2024-05-02 NOTE — Progress Notes (Signed)
 ANNUAL DIABETIC FOOT EXAM  Subjective: Dana Lucas presents today for annual diabetic foot exam. Chief Complaint  Patient presents with   RFc    Rm1 Routine foot care/ Dr.Lakeisha Entzminger last visit July 2025/ A1C 7.8   Patient confirms h/o diabetes.  Patient denies any h/o foot wounds.  Sigurd Reena LABOR, MD is patient's PCP.  Past Medical History:  Diagnosis Date   Diabetes mellitus without complication (HCC)    Hypertension    Stroke [facility])    Patient Active Problem List   Diagnosis Date Noted   Hypertension    Diabetes mellitus without complication (HCC)    Stroke (HCC)    GIB (gastrointestinal bleeding)    AKI (acute kidney injury) (HCC)    UTI (urinary tract infection)    Past Surgical History:  Procedure Laterality Date   ABDOMINAL HYSTERECTOMY     COLONOSCOPY N/A 10/06/2019   Procedure: COLONOSCOPY;  Surgeon: Toledo, Ladell POUR, MD;  Location: [facility] ENDOSCOPY;  Service: Gastroenterology;  Laterality: N/A;   Current Outpatient Medications on File Prior to Visit  Medication Sig Dispense Refill   amLODipine  (NORVASC ) 5 MG tablet Take 5 mg by mouth daily.     atorvastatin  (LIPITOR) 80 MG tablet Take 80 mg by mouth at bedtime.     cloNIDine  (CATAPRES  - DOSED IN MG/24 HR) 0.1 mg/24hr patch Place 0.1 mg onto the skin every Tuesday.     clopidogrel  (PLAVIX ) 75 MG tablet Take 75 mg by mouth daily.     COREG  CR 20 MG 24 hr capsule Take 20 mg by mouth daily.     DESITIN 40 % PSTE Apply topically 2 (two) times daily.     Emollient (AQUAPHOR ADVANCED THERAPY) OINT Apply topically.     glucose blood test strip SMARTSIG:Via Meter     insulin  glargine (LANTUS ) 100 UNIT/ML injection Inject 30 Units into the skin 2 (two) times daily.     JARDIANCE 10 MG TABS tablet Take 10 mg by mouth daily.     Lidocaine  HCl 4 % CREA Apply 1 application. topically 3 (three) times daily as needed (pain). (apply to left calf)     losartan  (COZAAR ) 50 MG tablet Take 50 mg by mouth daily.      TRUEplus Lancets 28G MISC Apply topically.     ULTICARE SHORT PEN NEEDLES 31G X 8 MM MISC Inject into the skin.     alendronate (FOSAMAX) 70 MG tablet Take 70 mg by mouth every Tuesday. Take with a full glass of water on an empty stomach. (Patient not taking: Reported on 04/29/2024)     cholecalciferol (VITAMIN D) 25 MCG (1000 UNIT) tablet Take by mouth. (Patient not taking: Reported on 04/29/2024)     famotidine (PEPCID) 20 MG tablet Take 20 mg by mouth daily. (Patient not taking: Reported on 04/29/2024)     insulin  aspart (NOVOLOG ) 100 UNIT/ML injection Inject 6-10 Units into the skin See admin instructions. Inject under the skin three times daily according to sliding scale: 150-200: 6u 201-275: 8u >275: 10u (Patient not taking: Reported on 04/29/2024)     mirabegron  ER (MYRBETRIQ ) 50 MG TB24 tablet Take 50 mg by mouth daily. (Patient not taking: Reported on 04/29/2024)     No current facility-administered medications on file prior to visit.    Allergies  Allergen Reactions   Lisinopril Cough   Shellfish Allergy Diarrhea   Social History   Occupational History   Not on file  Tobacco Use   Smoking status: Never  Smokeless tobacco: Never  Substance and Sexual Activity   Alcohol use: Not Currently   Drug use: Not Currently   Sexual activity: Not on file   Family History  Problem Relation Age of Onset   Breast cancer Mother 53    There is no immunization history on file for this patient.   Review of Systems: Negative except as noted in the HPI.   Objective: There were no vitals filed for this visit.  Dana Lucas is a pleasant 73 y.o. female in NAD. AAO X 3.  Diabetic foot exam was performed with the following findings:   Vascular Examination: CFT <3 seconds b/l. DP/PT pulses faintly palpable b/l. Digital hair absent.  Trace edema b/l. Skin temperature gradient warm to warm b/l. No pain with calf compression. No ischemia or gangrene. No cyanosis or clubbing noted b/l.     Neurological Examination: Sensation grossly intact b/l with 10 gram monofilament. Vibratory sensation intact b/l.   Dermatological Examination: Pedal skin warm and supple b/l.   No open wounds. No interdigital macerations.  Toenails 1-5 b/l thick, discolored, elongated with subungual debris and pain on dorsal palpation.    No hyperkeratotic nor porokeratotic lesions.  Musculoskeletal Examination: Muscle strength 5/5 to all lower extremity muscle groups bilaterally. No pain, crepitus or joint limitation noted with ROM b/l LE. Pes planus deformity noted bilateral LE.. Mobility via motorized chair assistance.  Radiographs: None     Lab Results  Component Value Date   HGBA1C 7.5 (H) 10/05/2019   ADA Risk Categorization: Low Risk :  Patient has all of the following: Intact protective sensation No prior foot ulcer  No severe deformity Pedal pulses present  Assessment: 1. Pain due to onychomycosis of toenails of both feet   2. Pes planus of both feet   3. Diabetes mellitus without complication (HCC)   4. Encounter for diabetic foot exam (HCC)     Plan: Diabetic foot examination performed today.  All patient's and/or POA's questions/concerns addressed on today's visit. Mycotic toenails 1-5 debrided in length and girth without incident. Continue daily foot inspections and monitor blood glucose per PCP/Endocrinologist's recommendations. Continue soft, supportive shoe gear daily. Report any pedal injuries to medical professional. Call office if there are any questions/concerns. Return in about 3 months (around 07/30/2024).  Delon LITTIE Merlin, DPM      [place] LOCATION: [address], [place] [address]                   Office [phone]   [facility] LOCATION: [address] [place], [place] [address] Office [phone]

## 2024-05-03 ENCOUNTER — Other Ambulatory Visit: Payer: Self-pay | Admitting: Internal Medicine

## 2024-05-03 DIAGNOSIS — Z1231 Encounter for screening mammogram for malignant neoplasm of breast: Secondary | ICD-10-CM

## 2024-05-16 ENCOUNTER — Ambulatory Visit
Admission: RE | Admit: 2024-05-16 | Discharge: 2024-05-16 | Disposition: A | Discharge status: Home / Self Care | Source: Ambulatory Visit | Attending: Internal Medicine | Admitting: Internal Medicine

## 2024-05-16 DIAGNOSIS — Z1231 Encounter for screening mammogram for malignant neoplasm of breast: Secondary | ICD-10-CM | POA: Insufficient documentation

## 2024-08-01 ENCOUNTER — Ambulatory Visit (INDEPENDENT_AMBULATORY_CARE_PROVIDER_SITE_OTHER): Admitting: Podiatry

## 2024-08-01 ENCOUNTER — Encounter: Payer: Self-pay | Admitting: Podiatry

## 2024-08-01 DIAGNOSIS — M79675 Pain in left toe(s): Secondary | ICD-10-CM | POA: Diagnosis not present

## 2024-08-01 DIAGNOSIS — M79674 Pain in right toe(s): Secondary | ICD-10-CM | POA: Diagnosis not present

## 2024-08-01 DIAGNOSIS — B351 Tinea unguium: Secondary | ICD-10-CM | POA: Diagnosis not present

## 2024-08-01 DIAGNOSIS — E1151 Type 2 diabetes mellitus with diabetic peripheral angiopathy without gangrene: Secondary | ICD-10-CM | POA: Diagnosis not present

## 2024-08-01 DIAGNOSIS — L853 Xerosis cutis: Secondary | ICD-10-CM

## 2024-08-01 MED ORDER — AMMONIUM LACTATE 12 % EX LOTN: 1.0000 | TOPICAL_LOTION | CUTANEOUS | 5 refills | Status: AC | PRN

## 2024-08-01 NOTE — Progress Notes (Signed)
  Subjective:  Patient ID: Dana Lucas, female    DOB: 07-Jun-1951,  MRN: [id number]  Jehan Ranganathan presents to clinic today for preventative diabetic foot care for painful mycotic toenails of both feet that are difficult to trim. Pain interferes with daily activities and wearing enclosed shoe gear comfortably. Patient has h/o CVA with left sided weakness.  Chief Complaint  Patient presents with   Toe Pain    A1c 6.9.  Dr. Sigurd is her PCP. Last visit was on Monday   New problem(s): None.   PCP is Sigurd Reena LABOR, MD.  Allergies  Allergen Reactions   Lisinopril Cough   Shellfish Allergy Diarrhea    Review of Systems: Negative except as noted in the HPI.  Objective: There were no vitals filed for this visit. Gearlene Lucas is a pleasant 73 y.o. female in NAD. AAO x 3.  Vascular Examination: CFT <4 seconds b/l. DP/PT pulses faintly palpable b/l. Digital hair absnet.  Skin temperature gradient warm to cool b/l. No pain with calf compression. No ischemia or gangrene. No cyanosis or clubbing noted b/l. Dependent edema noted b/l LE.   Neurological Examination: Sensation grossly intact b/l with 10 gram monofilament.    Dermatological Examination: No open wounds. No interdigital macerations.  Toenails 1-5 b/l thick, discolored, elongated with subungual debris and pain on dorsal palpation.    No corns, calluses, nor porokeratotic lesions.  Pedal skin noted to be dry b/l feet.  Musculoskeletal Examination: Muscle strength 5/5 to all lower extremity LLE. Hemiplegia LLE. No pain, crepitus or joint limitation noted with ROM b/l LE. Pes planus deformity noted bilateral LE.. Mobility via motorized chair assistance.  Radiographs: None  Assessment/Plan: 1. Pain due to onychomycosis of toenails of both feet   2. Xerosis cutis   3. Type II diabetes mellitus with peripheral circulatory disorder (HCC)     Meds ordered this encounter  Medications   ammonium lactate (LAC-HYDRIN) 12 %  lotion    Sig: Apply 1 Application topically as needed for dry skin.    Dispense:  400 g    Refill:  5  Patient was evaluated and treated. All patient's and/or POA's questions/concerns addressed on today's visit. Toenails 1-5 b/l debrided in length and girth without incident. Continue foot and shoe inspections daily. Monitor blood glucose per PCP/Endocrinologist's recommendations. Continue soft, supportive shoe gear daily. Report any pedal injuries to medical professional. Call office if there are any questions/concerns. -For xerosis, Rx sent for Ammonium Lactate Lotion 12%. Apply to feet twice daily avoiding application between toes. Advised patient not to pull on dry skin. She related understanding. -Patient/POA to call should there be question/concern in the interim.   Return in about 3 months (around 11/01/2024).  Delon LITTIE Merlin, DPM      [place] LOCATION: [address], [place] [address]                   Office [phone]   [facility] LOCATION: [address] [place], [place] [address] Office [phone]

## 2024-11-14 ENCOUNTER — Ambulatory Visit: Admitting: Podiatry

## 2024-11-27 ENCOUNTER — Encounter (INDEPENDENT_AMBULATORY_CARE_PROVIDER_SITE_OTHER)

## 2024-11-27 ENCOUNTER — Encounter (INDEPENDENT_AMBULATORY_CARE_PROVIDER_SITE_OTHER): Admitting: Nurse Practitioner

## 2025-01-20 ENCOUNTER — Ambulatory Visit: Admitting: Urology
